# Patient Record
Sex: Male | Born: 1960
Health system: Southern US, Community
[De-identification: ages and names within clinical notes are randomized; demographics above are authoritative.]

## PROBLEM LIST (undated history)

## (undated) DIAGNOSIS — K219 Gastro-esophageal reflux disease without esophagitis: Secondary | ICD-10-CM

## (undated) DIAGNOSIS — C61 Malignant neoplasm of prostate: Secondary | ICD-10-CM

## (undated) DIAGNOSIS — R9431 Abnormal electrocardiogram [ECG] [EKG]: Secondary | ICD-10-CM

## (undated) DIAGNOSIS — F419 Anxiety disorder, unspecified: Secondary | ICD-10-CM

## (undated) DIAGNOSIS — F17210 Nicotine dependence, cigarettes, uncomplicated: Secondary | ICD-10-CM

## (undated) DIAGNOSIS — R202 Paresthesia of skin: Secondary | ICD-10-CM

## (undated) DIAGNOSIS — N529 Male erectile dysfunction, unspecified: Secondary | ICD-10-CM

## (undated) HISTORY — DX: Anxiety disorder, unspecified: F41.9

## (undated) HISTORY — PX: PROSTATE BIOPSY: SHX241

## (undated) HISTORY — DX: Abnormal electrocardiogram (ECG) (EKG): R94.31

## (undated) HISTORY — PX: NO PAST SURGERIES: SHX2092

## (undated) HISTORY — DX: Paresthesia of skin: R20.2

## (undated) HISTORY — DX: Gastro-esophageal reflux disease without esophagitis: K21.9

## (undated) HISTORY — DX: Male erectile dysfunction, unspecified: N52.9

## (undated) HISTORY — DX: Nicotine dependence, cigarettes, uncomplicated: F17.210

---

## 1999-09-25 ENCOUNTER — Emergency Department (HOSPITAL_COMMUNITY): Admission: EM | Admit: 1999-09-25 | Discharge: 1999-09-25 | Payer: Self-pay | Admitting: Emergency Medicine

## 2004-08-01 ENCOUNTER — Ambulatory Visit: Payer: Self-pay | Admitting: Pulmonary Disease

## 2005-09-13 ENCOUNTER — Ambulatory Visit: Payer: Self-pay | Admitting: Pulmonary Disease

## 2006-08-01 ENCOUNTER — Ambulatory Visit: Payer: Self-pay | Admitting: Pulmonary Disease

## 2006-08-29 ENCOUNTER — Ambulatory Visit: Payer: Self-pay | Admitting: Pulmonary Disease

## 2006-08-29 LAB — CONVERTED CEMR LAB
Basophils Relative: 0 % (ref 0.0–1.0)
Bilirubin Urine: NEGATIVE
CO2: 30 meq/L (ref 19–32)
Calcium: 9.2 mg/dL (ref 8.4–10.5)
Chloride: 107 meq/L (ref 96–112)
Chol/HDL Ratio, serum: 3.5
Epithelial cells, urine: NEGATIVE /lpf
GFR calc non Af Amer: 78 mL/min
Glomerular Filtration Rate, Af Am: 94 mL/min/{1.73_m2}
Glucose, Bld: 99 mg/dL (ref 70–99)
Hemoglobin: 15.8 g/dL (ref 13.0–17.0)
Ketones, ur: NEGATIVE mg/dL
Lymphocytes Relative: 49 % — ABNORMAL HIGH (ref 12.0–46.0)
MCHC: 34 g/dL (ref 30.0–36.0)
MCV: 93.5 fL (ref 78.0–100.0)
Neutro Abs: 1.5 10*3/uL (ref 1.4–7.7)
Potassium: 3.9 meq/L (ref 3.5–5.1)
RBC / HPF: NONE SEEN
RBC: 4.97 M/uL (ref 4.22–5.81)
TSH: 1.37 microintl units/mL (ref 0.35–5.50)
VLDL: 8 mg/dL (ref 0–40)
WBC: 3.8 10*3/uL — ABNORMAL LOW (ref 4.5–10.5)

## 2007-09-03 DIAGNOSIS — F411 Generalized anxiety disorder: Secondary | ICD-10-CM | POA: Insufficient documentation

## 2007-09-04 ENCOUNTER — Ambulatory Visit: Payer: Self-pay | Admitting: Pulmonary Disease

## 2007-09-04 DIAGNOSIS — R209 Unspecified disturbances of skin sensation: Secondary | ICD-10-CM | POA: Insufficient documentation

## 2007-09-04 DIAGNOSIS — F17200 Nicotine dependence, unspecified, uncomplicated: Secondary | ICD-10-CM | POA: Insufficient documentation

## 2007-09-04 LAB — CONVERTED CEMR LAB
BUN: 13 mg/dL (ref 6–23)
Basophils Relative: 0.5 % (ref 0.0–1.0)
Bilirubin Urine: NEGATIVE
Bilirubin, Direct: 0.2 mg/dL (ref 0.0–0.3)
CO2: 30 meq/L (ref 19–32)
Calcium: 9.8 mg/dL (ref 8.4–10.5)
Chloride: 103 meq/L (ref 96–112)
Cholesterol: 169 mg/dL (ref 0–200)
Creatinine, Ser: 1 mg/dL (ref 0.4–1.5)
Crystals: NEGATIVE
GFR calc Af Amer: 104 mL/min
GFR calc non Af Amer: 86 mL/min
Glucose, Bld: 100 mg/dL — ABNORMAL HIGH (ref 70–99)
HCT: 43.5 % (ref 39.0–52.0)
Ketones, ur: NEGATIVE mg/dL
LDL Cholesterol: 118 mg/dL — ABNORMAL HIGH (ref 0–99)
Lymphocytes Relative: 35.1 % (ref 12.0–46.0)
MCHC: 35.9 g/dL (ref 30.0–36.0)
Monocytes Relative: 16.9 % — ABNORMAL HIGH (ref 3.0–11.0)
Neutrophils Relative %: 46.3 % (ref 43.0–77.0)
Platelets: 266 10*3/uL (ref 150–400)
RBC / HPF: NONE SEEN
RBC: 4.68 M/uL (ref 4.22–5.81)
Specific Gravity, Urine: 1.015 (ref 1.000–1.03)
TSH: 0.9 microintl units/mL (ref 0.35–5.50)
Total Protein, Urine: NEGATIVE mg/dL

## 2008-09-15 ENCOUNTER — Ambulatory Visit: Payer: Self-pay | Admitting: Pulmonary Disease

## 2008-09-15 DIAGNOSIS — F528 Other sexual dysfunction not due to a substance or known physiological condition: Secondary | ICD-10-CM | POA: Insufficient documentation

## 2009-07-03 ENCOUNTER — Encounter: Payer: Self-pay | Admitting: Adult Health

## 2009-07-03 ENCOUNTER — Ambulatory Visit: Payer: Self-pay | Admitting: Pulmonary Disease

## 2009-07-04 LAB — CONVERTED CEMR LAB
Hemoglobin, Urine: NEGATIVE
Leukocytes, UA: NEGATIVE
Nitrite: NEGATIVE
PSA: 0.82 ng/mL (ref 0.10–4.00)

## 2009-09-22 ENCOUNTER — Ambulatory Visit: Payer: Self-pay | Admitting: Pulmonary Disease

## 2009-09-22 DIAGNOSIS — R739 Hyperglycemia, unspecified: Secondary | ICD-10-CM | POA: Insufficient documentation

## 2009-09-22 DIAGNOSIS — R9431 Abnormal electrocardiogram [ECG] [EKG]: Secondary | ICD-10-CM | POA: Insufficient documentation

## 2009-09-22 DIAGNOSIS — R7309 Other abnormal glucose: Secondary | ICD-10-CM | POA: Insufficient documentation

## 2009-09-22 LAB — CONVERTED CEMR LAB
Alkaline Phosphatase: 46 units/L (ref 39–117)
BUN: 15 mg/dL (ref 6–23)
Cholesterol: 155 mg/dL (ref 0–200)
Creatinine, Ser: 1 mg/dL (ref 0.4–1.5)
Eosinophils Relative: 1.9 % (ref 0.0–5.0)
GFR calc non Af Amer: 103.4 mL/min (ref >60)
Glucose, Bld: 106 mg/dL — ABNORMAL HIGH (ref 70–99)
Hemoglobin, Urine: NEGATIVE
Hemoglobin: 14.4 g/dL (ref 13.0–17.0)
Hgb A1c MFr Bld: 5.8 % (ref 4.6–6.5)
LDL Cholesterol: 105 mg/dL — ABNORMAL HIGH (ref 0–99)
Leukocytes, UA: NEGATIVE
Lymphocytes Relative: 34.6 % (ref 12.0–46.0)
MCHC: 33.9 g/dL (ref 30.0–36.0)
Monocytes Relative: 17.2 % — ABNORMAL HIGH (ref 3.0–12.0)
Neutro Abs: 1.8 10*3/uL (ref 1.4–7.7)
Neutrophils Relative %: 46.3 % (ref 43.0–77.0)
Nitrite: NEGATIVE
Platelets: 246 10*3/uL (ref 150.0–400.0)
RBC: 4.49 M/uL (ref 4.22–5.81)
Sodium: 143 meq/L (ref 135–145)
Specific Gravity, Urine: >=1.03 (ref 1.000–1.030)
TSH: 1.21 microintl units/mL (ref 0.35–5.50)
Total Bilirubin: 0.9 mg/dL (ref 0.3–1.2)
Total CHOL/HDL Ratio: 3
Urine Glucose: NEGATIVE mg/dL
pH: 5.5 (ref 5.0–8.0)

## 2010-05-22 ENCOUNTER — Telehealth (INDEPENDENT_AMBULATORY_CARE_PROVIDER_SITE_OTHER): Payer: Self-pay | Admitting: *Deleted

## 2010-09-16 LAB — CONVERTED CEMR LAB
ALT: 23 units/L (ref 0–53)
AST: 21 units/L (ref 0–37)
Alkaline Phosphatase: 49 units/L (ref 39–117)
Basophils Relative: 0.8 % (ref 0.0–3.0)
Chloride: 104 meq/L (ref 96–112)
Cholesterol: 147 mg/dL (ref 0–200)
Crystals: NEGATIVE
GFR calc Af Amer: 117 mL/min
GFR calc non Af Amer: 97 mL/min
HDL: 37.2 mg/dL — ABNORMAL LOW (ref >39.0)
Ketones, ur: NEGATIVE mg/dL
LDL Cholesterol: 104 mg/dL — ABNORMAL HIGH (ref 0–99)
Leukocytes, UA: NEGATIVE
Lymphocytes Relative: 33.6 % (ref 12.0–46.0)
MCHC: 33.9 g/dL (ref 30.0–36.0)
MCV: 94.3 fL (ref 78.0–100.0)
Mucus, UA: NEGATIVE
Neutro Abs: 1.8 10*3/uL (ref 1.4–7.7)
Neutrophils Relative %: 46.9 % (ref 43.0–77.0)
Potassium: 3.9 meq/L (ref 3.5–5.1)
RBC: 4.51 M/uL (ref 4.22–5.81)
RDW: 12.4 % (ref 11.5–14.6)
Sodium: 139 meq/L (ref 135–145)
Specific Gravity, Urine: 1.025 (ref 1.000–1.03)
TSH: 1.26 microintl units/mL (ref 0.35–5.50)
Total Bilirubin: 1.3 mg/dL — ABNORMAL HIGH (ref 0.3–1.2)
Total Protein, Urine: NEGATIVE mg/dL
Urine Glucose: NEGATIVE mg/dL
Urobilinogen, UA: 0.2 (ref 0.0–1.0)

## 2010-09-18 NOTE — Progress Notes (Signed)
Summary: talk to nurse----metal in buttocks  Phone Note Call from Patient Call back at Home Phone [phone]   Caller: Patient Call For: nadel Reason for Call: Talk to Nurse Summary of Call: Pt states he sat on his lawn mower Friday, and he suspects he got a piece of metal stuck in his backside, c/o of swelling around this area since Sunday, pls advise.//[facility] Initial call taken by: Darletta Moll,  May 22, 2010 10:01 AM  Follow-up for Phone Call        called and spoke with pt.  pt states he believes he either has a piece of metal or fiberglass in his buttocks and the area is sore to the touch. Pt wonders if it might be getting infected.  I recommended pt go to urgent care to be evaluated as the area might need to be lanced.  Pt agreed and stated he will go to urgent care.  Aundra Millet Reynolds LPN  May 22, 2010 10:31 AM

## 2010-09-18 NOTE — Assessment & Plan Note (Signed)
Summary: physical/klw   CC:  1 yr ROV & CPX....  History of Present Illness: 50 y/o BM here for a yearly follow up visit and physical exam...    ~  Jan09:  he has been a smoker, and regular beer drinker; but states that he quit smoking for good about 5 days ago, and that he only drinks on weekends now... his only complaint revolves around some paresthesias that he feels when he raises his arms up in the air... he has noted some vague paresthesias in the past- eg a cold sensation in his L arm in 7/05 which improved after warming his arm w/ a blanket... 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 when he has to raise his arms out or up he gets a numbness in them that is uncomfortable to him... it generally goes away when he lowers his arms... PE was normal and pulses normal without briuts or loss of pulses w/ movement of the arm (full ROM too)... offered neuro eval but he prefers to wait...   ~  Jan10:  he has had a good year- no new complaints or concerns... he quit smoking last yr and has remained quit!  He has gained 17# in the interim to 173# today... his paresthesias are less noticable/ improved overall...     ~  September 22, 2009:  he has had a good yr- no new complaints or concerns... he has remained off cigs, weight stable at 173#... sl anxious- worries about male pattern baldness & some gray hairs...    Current Problems:   Ex-SMOKER (ICD-305.1) - he quit Jan09 on his own... doing well so far...  Hx of ELECTROCARDIOGRAM, ABNORMAL (ICD-794.31) - baseline EKG w/ benign early repolarization... he denies CP, palpit, dizzy, syncope, edema, etc...  OTHER ABNORMAL GLUCOSE (ICD-790.29) - hx of FBS as high as 117 in the past... FBS range 92-117 over the last 10 yrs... diet Rx alone- avoid sweets...  ERECTILE DYSFUNCTION (ICD-302.72) - on VIAGRA as needed.  PARESTHESIA (ICD-782.0) - ** SEE ABOVE ** less noticable now- improved.  ANXIETY (ICD-300.00) - prev hx  counselling from [facility]... he does not want anxiolytic Rx...  HEALTH MAINTENANCE:  49 y/o gentleman...  ~  DRE is neg- prostate 2+, no nodules... stool heme neg...    Allergies (verified): No Known Drug Allergies  Comments:  Nurse/Medical Assistant: The patient's medications and allergies were reviewed with the patient and were updated in the Medication and Allergy Lists.  Past History:  Past Medical History:  Hx of SMOKER (ICD-305.1) Hx of ELECTROCARDIOGRAM, ABNORMAL (ICD-794.31) OTHER ABNORMAL GLUCOSE (ICD-790.29) ERECTILE DYSFUNCTION (ICD-302.72) PARESTHESIA (ICD-782.0) ANXIETY (ICD-300.00)  Family History: Reviewed history from 09/15/2008 and no changes required. Father alive age 7, on disability x yrs after MVA Mother died age 68 in MVA 64 Sibs- 1Bro died in MVA, 1Bro died w/ leukemia... 2 Uncles w/ prostate cancer (and Grandfather also)...  Social History: Reviewed history from 09/15/2008 and no changes required. Divorced 1 Child Works [employer] Ex-Smoker, quit 1/09... Beer drinker  Review of Systems      See HPI       The patient complains of anxiety.  The patient denies fever, chills, sweats, anorexia, fatigue, weakness, malaise, weight loss, sleep disorder, blurring, diplopia, eye irritation, eye discharge, vision loss, eye pain, photophobia, earache, ear discharge, tinnitus, decreased hearing, nasal congestion, nosebleeds, sore throat, hoarseness, chest pain, palpitations, syncope, dyspnea on exertion, orthopnea, PND, peripheral edema, cough, dyspnea  at rest, excessive sputum, hemoptysis, wheezing, pleurisy, nausea, vomiting, diarrhea, constipation, change in bowel habits, abdominal pain, melena, hematochezia, jaundice, gas/bloating, indigestion/heartburn, dysphagia, odynophagia, dysuria, hematuria, urinary frequency, urinary hesitancy, nocturia, incontinence, back pain, joint pain, joint swelling, muscle cramps, muscle weakness,  stiffness, arthritis, sciatica, restless legs, leg pain at night, leg pain with exertion, rash, itching, dryness, suspicious lesions, paralysis, paresthesias, seizures, tremors, vertigo, transient blindness, frequent falls, frequent headaches, difficulty walking, depression, memory loss, confusion, cold intolerance, heat intolerance, polydipsia, polyphagia, polyuria, unusual weight change, abnormal bruising, bleeding, enlarged lymph nodes, urticaria, allergic rash, hay fever, and recurrent infections.    Vital Signs:  Patient profile:   50 year old male Height:      67 inches Weight:      173 pounds O2 Sat:      98 % on Room air Temp:     98.1 degrees F oral Pulse rate:   56 / minute BP sitting:   110 / 60  (right arm) Cuff size:   regular  Vitals Entered By: Randell Loop CMA (September 22, 2009 10:48 AM)  O2 Sat at Rest %:  98 O2 Flow:  Room air CC: 1 yr ROV & CPX... Is Patient Diabetic? No Pain Assessment Patient in pain? no      Comments no changes in meds   Physical Exam  Additional Exam:  WD, WN, 50 y/o BM in NAD... GENERAL:  Alert & oriented; pleasant & cooperative. HEENT:  [place]/AT, EOM-wnl, PERRLA, Fundi-benign, EACs-clear, TMs-wnl, NOSE-clear, THROAT-clear & wnl. NECK:  Supple w/ full ROM; no JVD; normal carotid impulses w/o bruits; no thyromegaly or nodules palpated; no lymphadenopathy. CHEST:  Clear to P & A; without wheezes/ rales/ or rhonchi. HEART:  Regular Rhythm; without murmurs/ rubs/ or gallops. ABDOMEN:  Soft & nontender; normal bowel sounds; no organomegaly or masses detected. Testicles well descended , no masses noted, femoral pulses intact , no hernias noted, prostate 2+ normal, stool neg. EXT: without deformities or arthritic changes; no varicose veins/ venous insuffic/ or edema. DERM:  No lesions noted; no rash etc...     CXR  Procedure date:  09/22/2009  Findings:      CHEST - 2 VIEW   Comparison: 09/15/2008   Findings: Heart size is normal.   The mediastinum is unremarkable. The lungs are clear.  No effusions.  No significant bony finding.   IMPRESSION: Normal chest   Read By:  Thomasenia Sales,  M.D.    Comments:      I have personally reviewed this XRay on the Image[employer] system- SN   EKG  Procedure date:  09/22/2009  Findings:      Sinus bradycardia with rate of:  52/ min... STTWA compatible w/ benign early repolarization... No change from old tracings...  SN   MISC. Report  Procedure date:  09/22/2009  Findings:      Lipid Panel (LIPID)   Cholesterol               155 mg/dL                   1-191   Triglycerides             24.0 mg/dL                  4.7-829.5   HDL                       62.13 mg/dL                 >  39.00   LDL Cholesterol      [H]  540 mg/dL                   9-81  BMP (METABOL)   Sodium                    143 mEq/L                   135-145   Potassium                 4.6 mEq/L                   3.5-5.1   Chloride                  110 mEq/L                   96-112   Carbon Dioxide            30 mEq/L                    19-32   Glucose              [H]  106 mg/dL                   19-14   BUN                       15 mg/dL                    7-82   Creatinine                1.0 mg/dL                   9.5-6.2   Calcium                   9.2 mg/dL                   1.3-08.6   GFR                       103.40 mL/min               >60  Hepatic/Liver Function Panel (HEPATIC)   Total Bilirubin           0.9 mg/dL                   5.7-8.4   Direct Bilirubin          0.1 mg/dL                   6.9-6.2   Alkaline Phosphatase      46 U/L                      39-117   AST                       16 U/L                      0-37   ALT                       18 U/L  0-53   Total Protein             7.2 g/dL                    4.5-4.0   Albumin                   4.0 g/dL                    9.8-1.1  Comments:      CBC Platelet w/Diff (CBCD)   White Cell Count     [L]  3.9  K/uL                    4.5-10.5   Red Cell Count            4.49 Mil/uL                 4.22-5.81   Hemoglobin                14.4 g/dL                   91.4-78.2   Hematocrit                42.6 %                      39.0-52.0   MCV                       95.0 fl                     78.0-100.0   Platelet Count            246.0 K/uL                  150.0-400.0   Neutrophil %              46.3 %                      43.0-77.0   Lymphocyte %              34.6 %                      12.0-46.0   Monocyte %           [H]  17.2 %                      3.0-12.0   Eosinophils%              1.9 %                       0.0-5.0   Basophils %               0.0 %                       0.0-3.0  TSH (TSH)   FastTSH                   1.21 uIU/mL                 0.35-5.50  Hemoglobin A1C (A1C)   Hemoglobin A1C            5.8 %  4.6-6.5  UDip w/Micro (URINE)   Color                     YELLOW   Clarity                   CLEAR                       Clear   Specific Gravity          >=1.030                     1.000 - 1.030   Urine Ph                  5.5                         5.0-8.0   Protein                   NEGATIVE                    Negative   Urine Glucose             NEGATIVE                    Negative   Ketones                   NEGATIVE                    Negative   Urine Bilirubin           NEGATIVE                    Negative   Blood                     NEGATIVE                    Negative   Urobilinogen              0.2                         0.0 - 1.0   Leukocyte Esterace        NEGATIVE                    Negative   Nitrite                   NEGATIVE                    Negative   Urine WBC                 0-2/hpf                     0-2/hpf   Urine Epith               Rare(0-4/hpf)               Rare(0-4/hpf)   Impression & Recommendations:  Problem # 1:  PHYSICAL EXAMINATION (ICD-V70.0) Good general health... he has remained off cigs, & cutting down on  his beer...  Orders: EKG w/ Interpretation (93000) Prescription Created Electronically [phone]) T-2 View CXR (71020TC) TLB-Lipid Panel (80061-LIPID) TLB-BMP (Basic Metabolic  Panel-BMET) (80048-METABOL) TLB-Hepatic/Liver Function Pnl (80076-HEPATIC) TLB-CBC Platelet - w/Differential (85025-CBCD) TLB-TSH (Thyroid Stimulating Hormone) (84443-TSH) TLB-A1C / Hgb A1C (Glycohemoglobin) (83036-A1C) TLB-Udip w/ Micro (81001-URINE)  Problem # 2:  ERECTILE DYSFUNCTION (ICD-302.72) We refilled his med... His updated medication list for this problem includes:    Viagra 100 Mg Tabs (Sildenafil citrate) ..... Use as directed  Problem # 3:  ANXIETY (ICD-300.00) Offered anxiolytic Rx but he declines...  Problem # 4:  OTHER MEDICAL PROBLEMS AS NOTED>>>  Complete Medication List: 1)  Aspirin 325 Mg Tabs (Aspirin) .... Take 1 tab by mouth once daily.Marland KitchenMarland Kitchen 2)  Viagra 100 Mg Tabs (Sildenafil citrate) .... Use as directed  Patient Instructions: 1)  Today we updated your med list- see below.... 2)  We refilled your med per request... 3)  Today we did your follow up CXR, EKG, & fasting blood work... please call the "phone tree" in a few days for your lab results.Marland KitchenMarland Kitchen 4)  Call for any problems.Marland KitchenMarland Kitchen 5)  Please schedule a follow-up appointment in 1 year, sooner as needed. Prescriptions: VIAGRA 100 MG  TABS (SILDENAFIL CITRATE) use as directed  #30 x prn   Entered and Authorized by:   Michele Mcalpine MD   Signed by:   Michele Mcalpine MD on 09/22/2009   Method used:   Print then Give to Patient   RxID:   [id number]    CardioPerfect ECG  ID: [id number] Patient: Danny [name] DOB: 06/19/1961 Age: 50 Years Old Sex: Male Race: Black Physician: Kriste Basque Technician: Renold Genta Height: 67 Weight: 173 Status: Unconfirmed Past Medical History:  Ex-SMOKER (ICD-305.1) - he quit Jan09 on his own... doing well so far...  ERECTILE DYSFUNCTION (ICD-302.72) - on VIAGRA as needed.  PARESTHESIA (ICD-782.0) -     ANXIETY (ICD-300.00) - [facility]...    Recorded: 09/22/2009 11:00 AM P/PR: 120 ms / 177 ms - Heart rate (maximum exercise) QRS: 93 QT/QTc/QTd: 400 ms / 386 ms / 43 ms - Heart rate (maximum exercise)  P/QRS/T axis: 69 deg / 69 deg / 50 deg - Heart rate (maximum exercise)  Heartrate: 52 bpm  Interpretation:  Sinus bradycardia with rate of:  52/ min... STTWA compatible w/ benign early repolarization... No change from old tracings...  SN

## 2010-12-07 ENCOUNTER — Telehealth: Payer: Self-pay | Admitting: Pulmonary Disease

## 2010-12-07 MED ORDER — CEPHALEXIN 500 MG PO CAPS: ORAL_CAPSULE | ORAL | Status: DC

## 2010-12-07 MED ORDER — NEOMYCIN-POLYMYXIN-HC 1 % OT SOLN: OTIC | Status: DC

## 2010-12-07 NOTE — Telephone Encounter (Signed)
Spoke w/ pt and he c/o severe R ear pain and can barely hear out of it, slight cough this morning, sore throat, feels congested. All started yesterday. Pt wanted to be seen but have no openings. Pt last seen 09/22/09 and told to f/u in 1 year and has no pending apt Pt denies any fever. Pt states he is going out of town this weekend to take care of his father and would like something called in if he can't be seen. Please advise Dr. Kriste Basque. Thanks  KNDA  Carver Fila, CMA

## 2010-12-07 NOTE — Telephone Encounter (Signed)
Per SN--please call in keflex 500  #21  1 tablet po tid and cortisporin otic 1 vial  2-3 drops in affected ear tid. thanks

## 2010-12-07 NOTE — Telephone Encounter (Signed)
Spoke w/ pt and advised of SN recs. Pt verbalized understanding. Pt aware rx was sent to [facility]

## 2011-02-08 ENCOUNTER — Encounter: Payer: Self-pay | Admitting: Pulmonary Disease

## 2011-02-08 ENCOUNTER — Ambulatory Visit (INDEPENDENT_AMBULATORY_CARE_PROVIDER_SITE_OTHER)
Admission: RE | Admit: 2011-02-08 | Discharge: 2011-02-08 | Disposition: A | Discharge status: Home / Self Care | Payer: 59 | Source: Ambulatory Visit | Attending: Pulmonary Disease | Admitting: Pulmonary Disease

## 2011-02-08 ENCOUNTER — Other Ambulatory Visit (INDEPENDENT_AMBULATORY_CARE_PROVIDER_SITE_OTHER): Payer: 59

## 2011-02-08 ENCOUNTER — Ambulatory Visit (INDEPENDENT_AMBULATORY_CARE_PROVIDER_SITE_OTHER): Payer: 59 | Admitting: Pulmonary Disease

## 2011-02-08 VITALS — BP 112/62 | HR 66 | Temp 97.5°F | Ht 67.0 in | Wt 169.6 lb

## 2011-02-08 DIAGNOSIS — Z0000 Encounter for general adult medical examination without abnormal findings: Secondary | ICD-10-CM

## 2011-02-08 DIAGNOSIS — R5381 Other malaise: Secondary | ICD-10-CM

## 2011-02-08 DIAGNOSIS — F411 Generalized anxiety disorder: Secondary | ICD-10-CM

## 2011-02-08 DIAGNOSIS — R7309 Other abnormal glucose: Secondary | ICD-10-CM

## 2011-02-08 DIAGNOSIS — F528 Other sexual dysfunction not due to a substance or known physiological condition: Secondary | ICD-10-CM

## 2011-02-08 DIAGNOSIS — R5383 Other fatigue: Secondary | ICD-10-CM

## 2011-02-08 DIAGNOSIS — R209 Unspecified disturbances of skin sensation: Secondary | ICD-10-CM

## 2011-02-08 DIAGNOSIS — R9431 Abnormal electrocardiogram [ECG] [EKG]: Secondary | ICD-10-CM

## 2011-02-08 LAB — TESTOSTERONE: Testosterone: 435.34 ng/dL (ref 350.00–890.00)

## 2011-02-08 LAB — CBC WITH DIFFERENTIAL/PLATELET
Basophils Absolute: 0 10*3/uL (ref 0.0–0.1)
Basophils Relative: 0.4 % (ref 0.0–3.0)
Eosinophils Absolute: 0.1 10*3/uL (ref 0.0–0.7)
MCHC: 34.3 g/dL (ref 30.0–36.0)
MCV: 93.1 fl (ref 78.0–100.0)
Monocytes Absolute: 0.9 10*3/uL (ref 0.1–1.0)
Neutrophils Relative %: 48.2 % (ref 43.0–77.0)
RBC: 4.54 Mil/uL (ref 4.22–5.81)
RDW: 13.9 % (ref 11.5–14.6)

## 2011-02-08 LAB — BASIC METABOLIC PANEL
Chloride: 102 mEq/L (ref 96–112)
Creatinine, Ser: 0.9 mg/dL (ref 0.4–1.5)
Potassium: 4.4 mEq/L (ref 3.5–5.1)
Sodium: 137 mEq/L (ref 135–145)

## 2011-02-08 LAB — HEPATIC FUNCTION PANEL
ALT: 19 U/L (ref 0–53)
AST: 18 U/L (ref 0–37)
Alkaline Phosphatase: 53 U/L (ref 39–117)
Bilirubin, Direct: 0.2 mg/dL (ref 0.0–0.3)
Total Bilirubin: 1.6 mg/dL — ABNORMAL HIGH (ref 0.3–1.2)

## 2011-02-08 LAB — LIPID PANEL
LDL Cholesterol: 114 mg/dL — ABNORMAL HIGH (ref 0–99)
Total CHOL/HDL Ratio: 4

## 2011-02-08 MED ORDER — CLOTRIMAZOLE-BETAMETHASONE 1-0.05 % EX CREA: TOPICAL_CREAM | CUTANEOUS | Status: DC

## 2011-02-08 NOTE — Patient Instructions (Signed)
Today we updated your med list in EPIC>    We wrote a new prescription for a cream to apply to your rash twice daily as needed...  Today we did your follow up CXR, EKG, & fasting blood work...    Please call the PHONE TREE in a few days for your results...    Dial [id number] & when prompted enter your patient number followed by the # symbol...    Your patient number is:  [id number]  Call for any questions...  Let's plan another routine appt in 1 year; sooner if needed for problems.Marland KitchenMarland Kitchen

## 2011-02-08 NOTE — Progress Notes (Signed)
Subjective:    Patient ID: Danny [name], male    DOB: 02-May-1961, 50 y.o.   MRN: [id number]  HPI 50 y/o BM here for a yearly follow up visit and physical exam...   ~  Jan09:  he has been a smoker, and regular beer drinker; but states that he quit smoking for good about 5 days ago, and that he only drinks on weekends now... his only complaint revolves around some paresthesias that he feels when he raises his arms up in the air... he has noted some vague paresthesias in the past- eg a cold sensation in his L arm in 7/05 which improved after warming his arm w/ a blanket... 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 when he has to raise his arms out or up he gets a numbness in them that is uncomfortable to him... it generally goes away when he lowers his arms... PE was normal and pulses normal without briuts or loss of pulses w/ movement of the arm (full ROM too)... offered neuro eval but he prefers to wait...  ~  Jan10:  he has had a good year- no new complaints or concerns... he quit smoking last yr and has remained quit!  He has gained 17# in the interim to 173# today... his paresthesias are less noticable/ improved overall...    ~  September 22, 2009:  he has had a good yr- no new complaints or concerns... he has remained off cigs, weight stable at 173#... sl anxious- worries about male pattern baldness & some gray hairs...  ~  February 08, 2011:  72mo ROV & CPX> he states that he's been doing "so-so", father died w/ lung cancer (smoker), medically he's reports doing OK- some minor complaints: ear infection Rx w/ Keflex, constipation, ED & Viagra helps, tired all the time, rash in groin, not resting well & not exercising...    He has remained off cigs now for ~3.5 yrs, doing well, no cough or phlegm...    CXR, EKG, & Fasting Labs all look good today...   Problem List:  Ex-SMOKER (ICD-305.1) - he quit Jan09 on his own & he has remained off cigarettes, doing well w/o  cough, sputum, dyspnea... ~  CXR 6/12 remains clear, NAD...  Hx of ELECTROCARDIOGRAM, ABNORMAL (ICD-794.31) - baseline EKG w/ benign early repolarization... he denies CP, palpit, dizzy, syncope, edema, etc... ~  EKG 6/12 showed NSR, early repol changes, NAD...  OTHER ABNORMAL GLUCOSE (ICD-790.29) - hx of FBS as high as 117 in the past... FBS range 92-117 over the last 10 yrs... diet Rx alone- avoid sweets... ~  Labs 6/12 showed FBS= 83  ERECTILE DYSFUNCTION (ICD-302.72) - on VIAGRA as needed. ~  Labs 6/12 showed Testosterone level = 435  PARESTHESIA (ICD-782.0) - ** SEE ABOVE ** less noticable now- improved.  ANXIETY (ICD-300.00) - prev hx counselling from [facility]... he does not want anxiolytic Rx...  HEALTH MAINTENANCE:  50 y/o gentleman... ~  DRE is neg- prostate 2+, no nodules... stool heme neg...   No past surgical history on file.   Outpatient Encounter Prescriptions as of 02/08/2011  Medication Sig Dispense Refill  . sildenafil (VIAGRA) 100 MG tablet Use as directed       . aspirin 325 MG tablet Take 325 mg by mouth daily.        . cephALEXin (KEFLEX) 500 MG capsule 1 capsule three times a day  21 capsule  0  . NEOMYCIN-POLYMYXIN-HC, OTIC, (CORTISPORIN) 1 %  SOLN 2-3 drops in affected ear three times a day  1 Bottle  0    No Known Allergies   Current Medications, Allergies, Past Medical History, Past Surgical History, Family History, and Social History were reviewed in [employer] record.   Review of Systems        The patient complains of anxiety.  The patient denies fever, chills, sweats, anorexia, fatigue, weakness, malaise, weight loss, sleep disorder, blurring, diplopia, eye irritation, eye discharge, vision loss, eye pain, photophobia, earache, ear discharge, tinnitus, decreased hearing, nasal congestion, nosebleeds, sore throat, hoarseness, chest pain, palpitations, syncope, dyspnea on exertion, orthopnea, PND, peripheral edema, cough,  dyspnea at rest, excessive sputum, hemoptysis, wheezing, pleurisy, nausea, vomiting, diarrhea, constipation, change in bowel habits, abdominal pain, melena, hematochezia, jaundice, gas/bloating, indigestion/heartburn, dysphagia, odynophagia, dysuria, hematuria, urinary frequency, urinary hesitancy, nocturia, incontinence, back pain, joint pain, joint swelling, muscle cramps, muscle weakness, stiffness, arthritis, sciatica, restless legs, leg pain at night, leg pain with exertion, rash, itching, dryness, suspicious lesions, paralysis, paresthesias, seizures, tremors, vertigo, transient blindness, frequent falls, frequent headaches, difficulty walking, depression, memory loss, confusion, cold intolerance, heat intolerance, polydipsia, polyphagia, polyuria, unusual weight change, abnormal bruising, bleeding, enlarged lymph nodes, urticaria, allergic rash, hay fever, and recurrent infections.    Objective:   Physical Exam      WD, WN, 50 y/o BM in NAD... GENERAL:  Alert & oriented; pleasant & cooperative. HEENT:  [place]/AT, EOM-wnl, PERRLA, Fundi-benign, EACs-clear, TMs-wnl, NOSE-clear, THROAT-clear & wnl. NECK:  Supple w/ full ROM; no JVD; normal carotid impulses w/o bruits; no thyromegaly or nodules palpated; no lymphadenopathy. CHEST:  Clear to P & A; without wheezes/ rales/ or rhonchi. HEART:  Regular Rhythm; without murmurs/ rubs/ or gallops. ABDOMEN:  Soft & nontender; normal bowel sounds; no organomegaly or masses detected. Testicles well descended , no masses noted, femoral pulses intact , no hernias noted, prostate 2+ normal, stool neg. EXT: without deformities or arthritic changes; no varicose veins/ venous insuffic/ or edema. DERM:  No lesions noted; no rash etc...   Assessment & Plan:   CPX>  He is stable w/ CXR, EKG, Fasting labs all outlined above & OK...  Ex-smoker> he has remained quit & essentially asymptomatic...  See Prob List above.Marland KitchenMarland Kitchen

## 2011-02-16 ENCOUNTER — Encounter: Payer: Self-pay | Admitting: Pulmonary Disease

## 2011-06-26 ENCOUNTER — Encounter: Payer: Self-pay | Admitting: Pulmonary Disease

## 2011-06-26 ENCOUNTER — Ambulatory Visit (INDEPENDENT_AMBULATORY_CARE_PROVIDER_SITE_OTHER): Payer: 59 | Admitting: Pulmonary Disease

## 2011-06-26 VITALS — BP 118/78 | HR 74 | Temp 97.2°F | Ht 67.0 in | Wt 180.8 lb

## 2011-06-26 DIAGNOSIS — F411 Generalized anxiety disorder: Secondary | ICD-10-CM

## 2011-06-26 DIAGNOSIS — R200 Anesthesia of skin: Secondary | ICD-10-CM

## 2011-06-26 DIAGNOSIS — M4712 Other spondylosis with myelopathy, cervical region: Secondary | ICD-10-CM

## 2011-06-26 DIAGNOSIS — R209 Unspecified disturbances of skin sensation: Secondary | ICD-10-CM

## 2011-06-26 DIAGNOSIS — M4722 Other spondylosis with radiculopathy, cervical region: Secondary | ICD-10-CM

## 2011-06-26 MED ORDER — CLONAZEPAM 0.5 MG PO TABS: ORAL_TABLET | ORAL | Status: DC

## 2011-06-26 NOTE — Patient Instructions (Signed)
Today we updated your med list in our EPIC system...    Continue your current medications the same...  We decided to further evaluate your right arm numbness by checking an MRI scan of your brain & neck...    We will call you w/ the result when avail...  In the meanwhile start the new KLONOPIN 0.5mg - 1/2 to 1 tab twice daily & note how it affects your symptom...  Call for any questions.Marland KitchenMarland Kitchen

## 2011-06-26 NOTE — Progress Notes (Signed)
Subjective:    Patient ID: Danny [name], male    DOB: 26-Aug-1960, 50 y.o.   MRN: [id number]  HPI 50 y/o BM here for a yearly follow up visit>>  ~  Jan09:  he has been a smoker, and regular beer drinker; but states that he quit smoking for good about 5 days ago, and that he only drinks on weekends now... his only complaint revolves around some paresthesias that he feels when he raises his arms up in the air... he has noted some vague paresthesias in the past- eg a cold sensation in his L arm in 7/05 which improved after warming his arm w/ a blanket... 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 when he has to raise his arms out or up he gets a numbness in them that is uncomfortable to him... it generally goes away when he lowers his arms... PE was normal and pulses normal without briuts or loss of pulses w/ movement of the arm (full ROM too)... offered neuro eval but he prefers to wait...  ~  Jan10:  he has had a good year- no new complaints or concerns... he quit smoking last yr and has remained quit!  He has gained 17# in the interim to 173# today... his paresthesias are less noticable/ improved overall...    ~  September 22, 2009:  he has had a good yr- no new complaints or concerns... he has remained off cigs, weight stable at 173#... sl anxious- worries about male pattern baldness & some gray hairs...  ~  February 08, 2011:  35mo ROV & CPX> he states that he's been doing "so-so", father died w/ lung cancer (smoker), medically he's reports doing OK- some minor complaints: ear infection Rx w/ Keflex, constipation, ED & Viagra helps, tired all the time, rash in groin, not resting well & not exercising...    He has remained off cigs now for ~3.5 yrs, doing well, no cough or phlegm...    CXR, EKG, & Fasting Labs all look good today...  ~  June 26, 2011:  Add-on appt for right arm numbness & tingling to his fingers of 2 wks duration; he denies neck pain or arm weakness &  there has been no trauma etc, but symptoms has been persistent over the last 2 wks; denies CP, palpit, N/V etc; also c/o some memory loss & under some stress; he notes that he tried a "male enhancer" for 1 wk starting about 2wks prior to these symptoms; we discussed need for MRI Brain & CSpine for further evaluation, in the meanwhile we will treat w/ Klonopin 0.5mg  Bid...    NOTE:  MRI Brain==> normal;  MRI CSpine==> multilevel spondylosis & central disc herniation, plus neural foraminal stenosis bilat compressing C7 nerve roots...  REFER to Neurosurg ...   Problem List:  Ex-SMOKER (ICD-305.1) - he quit Jan09 on his own & he has remained off cigarettes, doing well w/o cough, sputum, dyspnea... ~  CXR 6/12 remains clear, NAD...  Hx of ELECTROCARDIOGRAM, ABNORMAL (ICD-794.31) - baseline EKG w/ benign early repolarization... he denies CP, palpit, dizzy, syncope, edema, etc... ~  EKG 6/12 showed NSR, early repol changes, NAD...  OTHER ABNORMAL GLUCOSE (ICD-790.29) - hx of FBS as high as 117 in the past... FBS range 92-117 over the last 10 yrs... diet Rx alone- avoid sweets... ~  Labs 6/12 showed FBS= 83  ERECTILE DYSFUNCTION (ICD-302.72) - on VIAGRA as needed. ~  Labs 6/12 showed Testosterone level =  435  PARESTHESIA (ICD-782.0) - ** SEE ABOVE ** less noticable now- improved. ~  11/12: presents w/ right arm numbness & MRI Brain is WNL, MRI CSpine w/ multilevel spondylosis, central disc herniation, bilat C7 neural foraminal stenosis etc> refer to Neurosurg...  ANXIETY (ICD-300.00) - prev hx counselling from [facility]... he does not want anxiolytic Rx...  HEALTH MAINTENANCE:  50 y/o gentleman... ~  DRE is neg- prostate 2+, no nodules... stool heme neg...   No past surgical history on file.   Outpatient Encounter Prescriptions as of 06/26/2011  Medication Sig Dispense Refill  . aspirin 325 MG tablet Take 325 mg by mouth daily.        . Multiple Vitamins-Minerals (ONE-A-DAY MENS HEALTH  FORMULA) TABS Take 1 tablet by mouth daily.        . sildenafil (VIAGRA) 100 MG tablet Use as directed       . DISCONTD: cephALEXin (KEFLEX) 500 MG capsule 1 capsule three times a day  21 capsule  0  . DISCONTD: clotrimazole-betamethasone (LOTRISONE) cream Apply to affected area 2 times daily  15 g  6  . DISCONTD: NEOMYCIN-POLYMYXIN-HC, OTIC, (CORTISPORIN) 1 % SOLN 2-3 drops in affected ear three times a day  1 Bottle  0    No Known Allergies   Current Medications, Allergies, Past Medical History, Past Surgical History, Family History, and Social History were reviewed in [employer] record.   Review of Systems        The patient complains of anxiety.  The patient denies fever, chills, sweats, anorexia, fatigue, weakness, malaise, weight loss, sleep disorder, blurring, diplopia, eye irritation, eye discharge, vision loss, eye pain, photophobia, earache, ear discharge, tinnitus, decreased hearing, nasal congestion, nosebleeds, sore throat, hoarseness, chest pain, palpitations, syncope, dyspnea on exertion, orthopnea, PND, peripheral edema, cough, dyspnea at rest, excessive sputum, hemoptysis, wheezing, pleurisy, nausea, vomiting, diarrhea, constipation, change in bowel habits, abdominal pain, melena, hematochezia, jaundice, gas/bloating, indigestion/heartburn, dysphagia, odynophagia, dysuria, hematuria, urinary frequency, urinary hesitancy, nocturia, incontinence, back pain, joint pain, joint swelling, muscle cramps, muscle weakness, stiffness, arthritis, sciatica, restless legs, leg pain at night, leg pain with exertion, rash, itching, dryness, suspicious lesions, paralysis, paresthesias, seizures, tremors, vertigo, transient blindness, frequent falls, frequent headaches, difficulty walking, depression, memory loss, confusion, cold intolerance, heat intolerance, polydipsia, polyphagia, polyuria, unusual weight change, abnormal bruising, bleeding, enlarged lymph nodes, urticaria,  allergic rash, hay fever, and recurrent infections.    Objective:   Physical Exam      WD, WN, 50 y/o BM in NAD... GENERAL:  Alert & oriented; pleasant & cooperative. HEENT:  [place]/AT, EOM-wnl, PERRLA, Fundi-benign, EACs-clear, TMs-wnl, NOSE-clear, THROAT-clear & wnl. NECK:  Supple w/ full ROM; no JVD; normal carotid impulses w/o bruits; no thyromegaly or nodules palpated; no lymphadenopathy. CHEST:  Clear to P & A; without wheezes/ rales/ or rhonchi. HEART:  Regular Rhythm; without murmurs/ rubs/ or gallops. ABDOMEN:  Soft & nontender; normal bowel sounds; no organomegaly or masses detected. Testicles well descended , no masses noted, femoral pulses intact , no hernias noted, prostate 2+ normal, stool neg. EXT: without deformities or arthritic changes; no varicose veins/ venous insuffic/ or edema. DERM:  No lesions noted; no rash etc...  RADIOLOGY DATA:  Reviewed in the EPIC EMR & discussed w/ the patient...  LABORATORY DATA:  Reviewed in the EPIC EMR & discussed w/ the patient...    Assessment & Plan:   Right arm numbness & Abn MRI CSpine>  Pt is being referred to Neurosurg for their eval &  rx... We discussed starting Klonopin 0.5mg  Bid...   CPX>  He is stable w/ CXR, EKG, Fasting labs all outlined above & OK...  Ex-smoker> he has remained quit & essentially asymptomatic...  See Prob List above.Marland KitchenMarland Kitchen

## 2011-07-05 ENCOUNTER — Ambulatory Visit (HOSPITAL_COMMUNITY)
Admission: RE | Admit: 2011-07-05 | Discharge: 2011-07-05 | Disposition: A | Discharge status: Home / Self Care | Payer: 59 | Source: Ambulatory Visit | Attending: Pulmonary Disease | Admitting: Pulmonary Disease

## 2011-07-05 DIAGNOSIS — M47812 Spondylosis without myelopathy or radiculopathy, cervical region: Secondary | ICD-10-CM | POA: Insufficient documentation

## 2011-07-05 DIAGNOSIS — R200 Anesthesia of skin: Secondary | ICD-10-CM

## 2011-07-05 DIAGNOSIS — R209 Unspecified disturbances of skin sensation: Secondary | ICD-10-CM | POA: Insufficient documentation

## 2011-07-05 DIAGNOSIS — M5020 Other cervical disc displacement, unspecified cervical region: Secondary | ICD-10-CM | POA: Insufficient documentation

## 2011-07-05 MED ORDER — GADOBENATE DIMEGLUMINE 529 MG/ML IV SOLN
20.0000 mL | Freq: Once | INTRAVENOUS | Status: AC | PRN
  Administered 2011-07-05: 17 mL via INTRAVENOUS

## 2011-07-08 ENCOUNTER — Telehealth: Payer: Self-pay | Admitting: Pulmonary Disease

## 2011-07-08 NOTE — Telephone Encounter (Signed)
ATC NA WCB 

## 2011-07-08 NOTE — Telephone Encounter (Signed)
PT RETURNED CALL . Kathleen W Perdue  °

## 2011-07-08 NOTE — Telephone Encounter (Signed)
Result Note     Pt notified by Leigh... [facility] MRI Brain is neg/ WNL... MRI CSpine w/ multilevel spondylosis, disc disease & neural compromise... REC> Refer to Neurosurg   Patient would like specifics regarding MRI of the spine. He requests to speak with SN personally and says he can be reached daily at [phone] from 9am -2pm.

## 2011-07-09 ENCOUNTER — Other Ambulatory Visit: Payer: Self-pay | Admitting: Pulmonary Disease

## 2011-07-09 DIAGNOSIS — R937 Abnormal findings on diagnostic imaging of other parts of musculoskeletal system: Secondary | ICD-10-CM | POA: Insufficient documentation

## 2011-07-09 NOTE — Telephone Encounter (Signed)
SN called and spoke with pt and he is aware of MRI results.  He is aware that we will get him an appt with neurosurgery for his cspine.

## 2011-07-14 ENCOUNTER — Encounter: Payer: Self-pay | Admitting: Pulmonary Disease

## 2011-07-14 DIAGNOSIS — M4722 Other spondylosis with radiculopathy, cervical region: Secondary | ICD-10-CM | POA: Insufficient documentation

## 2011-11-27 ENCOUNTER — Other Ambulatory Visit: Payer: Self-pay | Admitting: Pulmonary Disease

## 2012-02-10 ENCOUNTER — Other Ambulatory Visit (INDEPENDENT_AMBULATORY_CARE_PROVIDER_SITE_OTHER): Payer: 59

## 2012-02-10 ENCOUNTER — Encounter: Payer: Self-pay | Admitting: Pulmonary Disease

## 2012-02-10 ENCOUNTER — Ambulatory Visit (INDEPENDENT_AMBULATORY_CARE_PROVIDER_SITE_OTHER): Payer: 59 | Admitting: Pulmonary Disease

## 2012-02-10 ENCOUNTER — Ambulatory Visit (INDEPENDENT_AMBULATORY_CARE_PROVIDER_SITE_OTHER)
Admission: RE | Admit: 2012-02-10 | Discharge: 2012-02-10 | Disposition: A | Discharge status: Home / Self Care | Payer: 59 | Source: Ambulatory Visit | Attending: Pulmonary Disease | Admitting: Pulmonary Disease

## 2012-02-10 VITALS — BP 110/68 | HR 79 | Temp 97.5°F | Ht 67.0 in | Wt 176.6 lb

## 2012-02-10 DIAGNOSIS — Z0000 Encounter for general adult medical examination without abnormal findings: Secondary | ICD-10-CM

## 2012-02-10 DIAGNOSIS — G588 Other specified mononeuropathies: Secondary | ICD-10-CM

## 2012-02-10 DIAGNOSIS — G5760 Lesion of plantar nerve, unspecified lower limb: Secondary | ICD-10-CM

## 2012-02-10 LAB — BASIC METABOLIC PANEL
BUN: 15 mg/dL (ref 6–23)
Chloride: 105 mEq/L (ref 96–112)
GFR: 109.93 mL/min (ref >60.00)
Glucose, Bld: 106 mg/dL — ABNORMAL HIGH (ref 70–99)
Potassium: 3.9 mEq/L (ref 3.5–5.1)
Sodium: 139 mEq/L (ref 135–145)

## 2012-02-10 LAB — CBC WITH DIFFERENTIAL/PLATELET
Basophils Absolute: 0 10*3/uL (ref 0.0–0.1)
Eosinophils Absolute: 0.1 10*3/uL (ref 0.0–0.7)
Eosinophils Relative: 2.3 % (ref 0.0–5.0)
HCT: 43.4 % (ref 39.0–52.0)
Lymphs Abs: 1.5 10*3/uL (ref 0.7–4.0)
MCV: 94 fl (ref 78.0–100.0)
Monocytes Absolute: 0.7 10*3/uL (ref 0.1–1.0)
Neutrophils Relative %: 48.5 % (ref 43.0–77.0)
Platelets: 260 10*3/uL (ref 150.0–400.0)
RDW: 12.9 % (ref 11.5–14.6)
WBC: 4.5 10*3/uL (ref 4.5–10.5)

## 2012-02-10 LAB — URINALYSIS
Bilirubin Urine: NEGATIVE
Hgb urine dipstick: NEGATIVE
Ketones, ur: NEGATIVE
Leukocytes, UA: NEGATIVE
Nitrite: NEGATIVE
Urobilinogen, UA: 0.2 (ref 0.0–1.0)
pH: 6 (ref 5.0–8.0)

## 2012-02-10 LAB — TSH: TSH: 1.49 u[IU]/mL (ref 0.35–5.50)

## 2012-02-10 LAB — HEPATIC FUNCTION PANEL
ALT: 23 U/L (ref 0–53)
AST: 25 U/L (ref 0–37)
Alkaline Phosphatase: 53 U/L (ref 39–117)
Total Bilirubin: 0.7 mg/dL (ref 0.3–1.2)

## 2012-02-10 LAB — LIPID PANEL
Cholesterol: 139 mg/dL (ref 0–200)
LDL Cholesterol: 91 mg/dL (ref 0–99)

## 2012-02-10 MED ORDER — METRONIDAZOLE 500 MG PO TABS: 500.0000 mg | ORAL_TABLET | Freq: Three times a day (TID) | ORAL | Status: AC

## 2012-02-10 MED ORDER — SILDENAFIL CITRATE 100 MG PO TABS: ORAL_TABLET | ORAL | Status: DC

## 2012-02-10 NOTE — Patient Instructions (Addendum)
Today we updated your med list in our EPIC system...    Continue your current medications the same...  Today we did your CXR, EKG & FASTING blood work...    We will call you w/ the results...  We will treat a potential Trichomonas infection w/ Flafyl (Metronidazole) 500mg  3 times daily for 7d...  [facility] will [facility] regarding the knot on your right 4th finger...  Call for any questions...  Let's plan a follow up visit in 6 months.Marland KitchenMarland Kitchen

## 2012-02-10 NOTE — Progress Notes (Signed)
Subjective:    Patient ID: Danny [name], male    DOB: [birth year], 51 y.o.   MRN: [id number]  HPI 51 y/o BM here for a yearly follow up visit>>  ~  Jan09:  he has been a smoker, and regular beer drinker; but states that he quit smoking for good about 5 days ago, and that he only drinks on weekends now... his only complaint revolves around some paresthesias that he feels when he raises his arms up in the air... he has noted some vague paresthesias in the past- eg a cold sensation in his L arm in 7/05 which improved after warming his arm w/ a blanket... 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 when he has to raise his arms out or up he gets a numbness in them that is uncomfortable to him... it generally goes away when he lowers his arms... PE was normal and pulses normal without briuts or loss of pulses w/ movement of the arm (full ROM too)... offered neuro eval but he prefers to wait... ~  Jan10:  he has had a good year- no new complaints or concerns... he quit smoking last yr and has remained quit!  He has gained 17# in the interim to 173# today... his paresthesias are less noticable/ improved overall...   ~  [id number]:  he has had a good yr- no new complaints or concerns... he has remained off cigs, weight stable at 173#... sl anxious- worries about male pattern baldness & some gray hairs...  ~  February 08, 2011:  26mo ROV & CPX> he states that he's been doing "so-so", father died w/ lung cancer (smoker), medically he's reports doing OK- some minor complaints: ear infection Rx w/ Keflex, constipation, ED & Viagra helps, tired all the time, rash in groin, not resting well & not exercising...    He has remained off cigs now for ~3.5 yrs, doing well, no cough or phlegm...    CXR, EKG, & Fasting Labs all look good today...  ~  June 26, 2011:  Add-on appt for right arm numbness & tingling to his fingers of 2 wks duration; he denies neck pain or arm weakness & there has been  no trauma etc, but symptoms has been persistent over the last 2 wks; denies CP, palpit, N/V etc; also c/o some memory loss & under some stress; he notes that he tried a "male enhancer" for 1 wk starting about 2wks prior to these symptoms; we discussed need for MRI Brain & CSpine for further evaluation, in the meanwhile we will treat w/ Klonopin 0.5mg  Bid...    NOTE:  MRI Brain==> normal;  MRI CSpine==> multilevel spondylosis & central disc herniation, plus neural foraminal stenosis bilat compressing C7 nerve roots...  Rec> refer to Neurosurg (Addendum:  Symptoms resolved on their own, pt never saw neurosurg, doing fine now).  ~  February 10, 2012:  7-59mo ROV & Danny [name] has a knot on the right hand 4th finger betw MCP & PIP joints- no pain, norm ROM, ?neuroma? & we will refer to DrSypher for review poss excision...  He notes that he's not resting well, works night shift & poor sleep habits; we discussed better sleep hygiene, he thinks it's his diet 7 asked NOT to eat when he gets home at ... Final concern is report of girlfriend Rx for Trich & we discussed checking UA but he has no urethritis, disch, etc; I indicated to him that we need to treat anyway  w/ Flagyl 500mg Tid x7d...  Ex-smoker> Quit 2008-02-12, father died from lung cancer; breathing is much better since he quit... ED> on viagra as needed; med refilled per request; Note> he reports girlfriend treated for trich- Rx FLAGYL 500mg Tidx7d. Paresthesias> long hx vague symptoms; MRI CSpine 11/12 showed spondylosis, central disc herniation, & C7 nerve root foraminal stenosis; but he reports all symptoms resolved on their own, no recurrence, never saw neurosurg, and doing satis at present... Anxiety> prev counseling from [facility], pt declines anxiolytic rx...    We reviewed prob list, meds, xrays and labs> see below>> CXR 6/13 showed normal hear size, clear lungs, NAD.Marland KitchenMarland Kitchen EKG 6/13 showed SBrady, rate55, early repol, NAD, no change from prev tracings... LABS  6/13:  FLP- at goals on diet alone;  Chems- wnl x BS=106;  CBC- wnl;  TSH=1.49;  PSA=1.08;  UA= clear...   Problem List:  Ex-SMOKER (ICD-305.1) - he quit Jan09 on his own & he has remained off cigarettes, doing well w/o cough, sputum, dyspnea... ~  CXR 6/12 remains clear, NAD.Marland Kitchen. ~  CXR 6/13 showed normal hear size, clear lungs, NAD...  Hx of ELECTROCARDIOGRAM, ABNORMAL (ICD-794.31) - baseline EKG w/ benign early repolarization... he denies CP, palpit, dizzy, syncope, edema, etc... ~  EKG 6/12 showed NSR, early repol changes, NAD... ~  EKG 6/13 showed SBrady, rate55, early repol, NAD, no change from prev tracings...  OTHER ABNORMAL GLUCOSE (ICD-790.29) - hx of FBS as high as 117 in the past... FBS range 92-117 over the last 10 yrs... diet Rx alone- avoid sweets... ~  Labs 6/12 showed FBS= 83 ~  Labs 6/13 showed FBS= 106  Poss Trichomonas infection >> pt noted 6/13 OV that girlfriend was treated for trich; UA is clear & he is asymptomatic; we discussed Rx w/ Flagyl... ERECTILE DYSFUNCTION (ICD-302.72) - on VIAGRA as needed. ~  Labs 6/12 showed Testosterone level = 435, PSA= 0.82... ~  Labs 6/13 showed PSA= 1.08  PARESTHESIA (ICD-782.0) - ** SEE ABOVE ** less noticable now- improved. ~  11/12: presents w/ right arm numbness & MRI Brain is WNL, MRI CSpine w/ multilevel spondylosis, central disc herniation, bilat C7 neural foraminal stenosis etc> refer to Neurosurg==> he never saw surgeon, symptoms resolved on their own & no recurrence so far...  Prob NEUROMA on right hand 4th digit >> pt noted this knot present for ~68mo during 6/13 CPX; refer to DrSypher for his eval & poss excision...  ANXIETY (ICD-300.00) - prev hx counselling from [facility]... he does not want anxiolytic Rx...  HEALTH MAINTENANCE:  51 y/o gentleman... ~  DRE is neg- prostate 2+, no nodules... stool heme neg...   No past surgical history on file.   Outpatient Encounter Prescriptions as of 02/10/2012  Medication  Sig Dispense Refill  . aspirin 325 MG tablet Take 325 mg by mouth daily.        . Multiple Vitamins-Minerals (ONE-A-DAY MENS HEALTH FORMULA) TABS Take 1 tablet by mouth daily.        Marland Kitchen VIAGRA 100 MG tablet USE AS DIRECTED  30 tablet  2  . DISCONTD: clonazePAM (KLONOPIN) 0.5 MG tablet Take 1/2 to 1 tablet by mouth two  times daily as directed  60 tablet  5    No Known Allergies   Current Medications, Allergies, Past Medical History, Past Surgical History, Family History, and Social History were reviewed in [employer] record.   Review of Systems        The patient complains of anxiety.  The  patient denies fever, chills, sweats, anorexia, fatigue, weakness, malaise, weight loss, sleep disorder, blurring, diplopia, eye irritation, eye discharge, vision loss, eye pain, photophobia, earache, ear discharge, tinnitus, decreased hearing, nasal congestion, nosebleeds, sore throat, hoarseness, chest pain, palpitations, syncope, dyspnea on exertion, orthopnea, PND, peripheral edema, cough, dyspnea at rest, excessive sputum, hemoptysis, wheezing, pleurisy, nausea, vomiting, diarrhea, constipation, change in bowel habits, abdominal pain, melena, hematochezia, jaundice, gas/bloating, indigestion/heartburn, dysphagia, odynophagia, dysuria, hematuria, urinary frequency, urinary hesitancy, nocturia, incontinence, back pain, joint pain, joint swelling, muscle cramps, muscle weakness, stiffness, arthritis, sciatica, restless legs, leg pain at night, leg pain with exertion, rash, itching, dryness, suspicious lesions, paralysis, paresthesias, seizures, tremors, vertigo, transient blindness, frequent falls, frequent headaches, difficulty walking, depression, memory loss, confusion, cold intolerance, heat intolerance, polydipsia, polyphagia, polyuria, unusual weight change, abnormal bruising, bleeding, enlarged lymph nodes, urticaria, allergic rash, hay fever, and recurrent infections.     Objective:   Physical Exam      WD, WN, 50 y/o BM in NAD... GENERAL:  Alert & oriented; pleasant & cooperative. HEENT:  [place]/AT, EOM-wnl, PERRLA, Fundi-benign, EACs-clear, TMs-wnl, NOSE-clear, THROAT-clear & wnl. NECK:  Supple w/ full ROM; no JVD; normal carotid impulses w/o bruits; no thyromegaly or nodules palpated; no lymphadenopathy. CHEST:  Clear to P & A; without wheezes/ rales/ or rhonchi. HEART:  Regular Rhythm; without murmurs/ rubs/ or gallops. ABDOMEN:  Soft & nontender; normal bowel sounds; no organomegaly or masses detected. Testicles well descended , no masses noted, femoral pulses intact , no hernias noted, prostate 2+ normal, stool neg. EXT: without deformities or arthritic changes; no varicose veins/ venous insuffic/ or edema. DERM:  No lesions noted; no rash etc...  RADIOLOGY DATA:  Reviewed in the EPIC EMR & discussed w/ the patient...  LABORATORY DATA:  Reviewed in the EPIC EMR & discussed w/ the patient...    Assessment & Plan:   CPX>  He is stable w/ CXR, EKG, Fasting labs all outlined above & OK...  Ex-smoker> he has remained quit & essentially asymptomatic...  Poss Trichomonas exposure>  We discussed Rx w/ Flagyl 500mg  tid x7d...  Right arm numbness & Abn MRI CSpine>  Pt was referred to Neurosurg but symptoms resolved on their own (?benefit from the Klonopin Rx?), no recurrence...  ?Neuroma on right hand 4th digit> referred to DrSypher for further eval...  See Prob List above...   Patient's Medications  New Prescriptions   METRONIDAZOLE (FLAGYL) 500 MG TABLET    Take 1 tablet (500 mg total) by mouth 3 (three) times daily.  Previous Medications   ASPIRIN 325 MG TABLET    Take 325 mg by mouth daily.     MULTIPLE VITAMINS-MINERALS (ONE-A-DAY MENS HEALTH FORMULA) TABS    Take 1 tablet by mouth daily.    Modified Medications   Modified Medication Previous Medication   SILDENAFIL (VIAGRA) 100 MG TABLET VIAGRA 100 MG tablet      Take as directed    USE  AS DIRECTED  Discontinued Medications   CLONAZEPAM (KLONOPIN) 0.5 MG TABLET    Take 1/2 to 1 tablet by mouth two  times daily as directed

## 2012-02-25 ENCOUNTER — Telehealth: Payer: Self-pay | Admitting: Pulmonary Disease

## 2012-02-25 NOTE — Telephone Encounter (Signed)
SN is aware. Nothing further needed. 

## 2012-02-25 NOTE — Telephone Encounter (Signed)
lmomtcb x1 for pt 

## 2012-02-25 NOTE — Telephone Encounter (Signed)
Spoke with patient states that swelling has gone down and feels he does not need to see  as specialist at this time. Advised pt that if swelling returns to call and we would get him  Back into see  Dr Teressa Senter.

## 2012-11-25 ENCOUNTER — Other Ambulatory Visit: Payer: Self-pay | Admitting: Pulmonary Disease

## 2012-12-21 ENCOUNTER — Encounter: Payer: Self-pay | Admitting: Pulmonary Disease

## 2012-12-21 ENCOUNTER — Ambulatory Visit (INDEPENDENT_AMBULATORY_CARE_PROVIDER_SITE_OTHER): Payer: 59 | Admitting: Pulmonary Disease

## 2012-12-21 VITALS — BP 124/60 | HR 77 | Temp 97.3°F | Ht 67.0 in | Wt 178.0 lb

## 2012-12-21 DIAGNOSIS — Z0000 Encounter for general adult medical examination without abnormal findings: Secondary | ICD-10-CM

## 2012-12-21 DIAGNOSIS — M545 Low back pain, unspecified: Secondary | ICD-10-CM

## 2012-12-21 DIAGNOSIS — R209 Unspecified disturbances of skin sensation: Secondary | ICD-10-CM

## 2012-12-21 MED ORDER — CYCLOBENZAPRINE HCL 10 MG PO TABS: 10.0000 mg | ORAL_TABLET | Freq: Three times a day (TID) | ORAL | Status: DC | PRN

## 2012-12-21 MED ORDER — TRAMADOL HCL 50 MG PO TABS: 50.0000 mg | ORAL_TABLET | Freq: Three times a day (TID) | ORAL | Status: DC | PRN

## 2012-12-21 MED ORDER — FIRST-DUKES MOUTHWASH MT SUSP: OROMUCOSAL | Status: DC

## 2012-12-21 NOTE — Patient Instructions (Addendum)
Today we updated your med list in our EPIC system...    Continue your current medications the same...  For your low back pain>>    Try a heating pad & curtail your activity for awhile...    We wrote a prescription for FLEXERIL (Cyclobenzaprine) 10mg - take one tab up to 3 times daily as needed for muscle spasm...    We also wrote for TRAMADOL 50mg - one tab up to 3 times daily as needed for pain...  Please return to our lab one morning this week for your FASTING blood work...    We will contact you w/ the results when available...   Call for any questions...  Let's plan a follow up visit in 72yr, sooner if needed for problems.Marland KitchenMarland Kitchen

## 2012-12-21 NOTE — Progress Notes (Signed)
Subjective:    Patient ID: Danny [name], male    DOB: [birth year], 52 y.o.   MRN: [id number]  HPI 52 y/o BM here for a yearly follow up visit>>  ~  June 26, 2011:  Add-on appt for right arm numbness & tingling to his fingers of 2 wks duration; he denies neck pain or arm weakness & there has been no trauma etc, but symptoms has been persistent over the last 2 wks; denies CP, palpit, N/V etc; also c/o some memory loss & under some stress; he notes that he tried a "male enhancer" for 1 wk starting about 2wks prior to these symptoms; we discussed need for MRI Brain & CSpine for further evaluation, in the meanwhile we will treat w/ Klonopin 0.5mg  Bid...    NOTE:  MRI Brain==> normal;  MRI CSpine==> multilevel spondylosis & central disc herniation, plus neural foraminal stenosis bilat compressing C7 nerve roots...  Rec> refer to Neurosurg (Addendum:  Symptoms resolved on their own, pt never saw neurosurg, doing fine now).  ~  February 10, 2012:  7-31mo ROV & Danny [name] has a knot on the right hand 4th finger betw MCP & PIP joints- no pain, norm ROM, ?neuroma? & we will refer to Danny [name] for review poss excision...  He notes that he's not resting well, works night shift & poor sleep habits; we discussed better sleep hygiene, he thinks it's his diet 7 asked NOT to eat when he gets home at ... Final concern is report of girlfriend Rx for Trich & we discussed checking UA but he has no urethritis, disch, etc; I indicated to him that we need to treat anyway w/ Flagyl 500mg Tid x7d...     Ex-smoker> Quit 31-Dec-2007, father died from lung cancer; breathing is much better since he quit...    ED> on viagra as needed; med refilled per request; Note> he reports girlfriend treated for trich- Rx FLAGYL 500mg Tidx7d.    Paresthesias> long hx vague symptoms; MRI CSpine 11/12 showed spondylosis, central disc herniation, & C7 nerve root foraminal stenosis; but he reports all symptoms resolved on their own, no recurrence, never saw  neurosurg, and doing satis at present...    Anxiety> prev counseling from [facility], pt declines anxiolytic rx...    We reviewed prob list, meds, xrays and labs> see below>> CXR 6/13 showed normal hear size, clear lungs, NAD.Marland KitchenMarland Kitchen EKG 6/13 showed SBrady, rate55, early repol, NAD, no change from prev tracings... LABS 6/13:  FLP- at goals on diet alone;  Chems- wnl x BS=106;  CBC- wnl;  TSH=1.49;  PSA=1.08;  UA= clear...   ~  Dec 21, 2012:  39mo ROV & Danny [name] has mult complaints including sore throat x several days (Rx- [facility]), LBP x3wks, hard to bend over, numbness in right arm but had MRI which was normal; he is not taking any meds regularly... We reviewed the following medical problems during today's office visit >>     Ex-smoker> Quit 2007-12-31, father died from lung cancer; breathing is much better since he quit; last CXR 6/13 was clear...    ED> on Viagra as needed; med refilled per request; Note> we prev treated him for trich w/ FLAGYL 500mg Tidx7d.    LBP> new complaint 5/14 7 we have rec Tramadol50 Tid prn & Flexeril10 Tid prn...    Paresthesias> long hx vague symptoms; MRI CSpine 11/12 showed spondylosis, central disc herniation, & C7 nerve root foraminal stenosis; but he reports all symptoms resolved on their own, no recurrence, never saw neurosurg, and doing  satis at present...    Anxiety> prev counseling from [facility], pt declines anxiolytic rx... We reviewed prob list, meds, xrays and labs> see below for updates >>  LABS 5/14:  He was asked to ret for FASTING blood work but never complied...          Problem List:  Ex-SMOKER (ICD-305.1) - he quit Jan09 on his own & he has remained off cigarettes, doing well w/o cough, sputum, dyspnea... ~  CXR 6/12 remains clear, NAD.Marland Kitchen. ~  CXR 6/13 showed normal hear size, clear lungs, NAD...  Hx of ELECTROCARDIOGRAM, ABNORMAL (ICD-794.31) - baseline EKG w/ benign early repolarization... he denies CP, palpit, dizzy, syncope, edema, etc... ~  EKG 6/12  showed NSR, early repol changes, NAD... ~  EKG 6/13 showed SBrady, rate55, early repol, NAD, no change from prev tracings...  OTHER ABNORMAL GLUCOSE (ICD-790.29) - hx of FBS as high as 117 in the past... FBS range 92-117 over the last 10 yrs... diet Rx alone- avoid sweets... ~  Labs 6/12 showed FBS= 83 ~  Labs 6/13 showed FBS= 106  Poss Trichomonas infection >> pt noted 6/13 OV that girlfriend was treated for trich; UA is clear & he is asymptomatic; we discussed Rx w/ Flagyl... ERECTILE DYSFUNCTION (ICD-302.72) - on VIAGRA as needed. ~  Labs 6/12 showed Testosterone level = 435, PSA= 0.82... ~  Labs 6/13 showed PSA= 1.08  LBP >> ~  Pt mentioned LBP 5/14 OV> no prev eval & he was rec to take Tramadol, Flexeril, rest & heat; he will let us know if he needs to see Ortho for referral...  PARESTHESIA (ICD-782.0) - ** SEE ABOVE ** less noticable now- improved. ~  11/12: presents w/ right arm numbness & MRI Brain is WNL, MRI CSpine w/ multilevel spondylosis, central disc herniation, bilat C7 neural foraminal stenosis etc> refer to Neurosurg==> he never saw surgeon, symptoms resolved on their own & no recurrence so far...  Prob NEUROMA on right hand 4th digit >> pt noted this knot present for ~24mo during 6/13 CPX; refer to Danny [name] for his eval & poss excision...  ANXIETY (ICD-300.00) - prev hx counselling from [facility]... he does not want anxiolytic Rx...  HEALTH MAINTENANCE:  52 y/o gentleman... ~  DRE is neg- prostate 2+, no nodules... stool heme neg...   History reviewed. No pertinent past surgical history.   Outpatient Encounter Prescriptions as of 12/21/2012  Medication Sig Dispense Refill  . aspirin 325 MG tablet Take 325 mg by mouth daily.        . Multiple Vitamins-Minerals (ONE-A-DAY MENS HEALTH FORMULA) TABS Take 1 tablet by mouth daily.        . sildenafil (VIAGRA) 100 MG tablet Take as directed  10 tablet  6  . VIAGRA 100 MG tablet USE AS DIRECTED  10 tablet  2   No  facility-administered encounter medications on file as of 12/21/2012.    No Known Allergies   Current Medications, Allergies, Past Medical History, Past Surgical History, Family History, and Social History were reviewed in [employer] record.   Review of Systems        The patient complains of anxiety.  The patient denies fever, chills, sweats, anorexia, fatigue, weakness, malaise, weight loss, sleep disorder, blurring, diplopia, eye irritation, eye discharge, vision loss, eye pain, photophobia, earache, ear discharge, tinnitus, decreased hearing, nasal congestion, nosebleeds, sore throat, hoarseness, chest pain, palpitations, syncope, dyspnea on exertion, orthopnea, PND, peripheral edema, cough, dyspnea at rest, excessive sputum, hemoptysis, wheezing, pleurisy,  nausea, vomiting, diarrhea, constipation, change in bowel habits, abdominal pain, melena, hematochezia, jaundice, gas/bloating, indigestion/heartburn, dysphagia, odynophagia, dysuria, hematuria, urinary frequency, urinary hesitancy, nocturia, incontinence, back pain, joint pain, joint swelling, muscle cramps, muscle weakness, stiffness, arthritis, sciatica, restless legs, leg pain at night, leg pain with exertion, rash, itching, dryness, suspicious lesions, paralysis, paresthesias, seizures, tremors, vertigo, transient blindness, frequent falls, frequent headaches, difficulty walking, depression, memory loss, confusion, cold intolerance, heat intolerance, polydipsia, polyphagia, polyuria, unusual weight change, abnormal bruising, bleeding, enlarged lymph nodes, urticaria, allergic rash, hay fever, and recurrent infections.    Objective:   Physical Exam      WD, WN, 51 y/o BM in NAD... GENERAL:  Alert & oriented; pleasant & cooperative. HEENT:  [place]/AT, EOM-wnl, PERRLA, Fundi-benign, EACs-clear, TMs-wnl, NOSE-clear, THROAT-clear & wnl. NECK:  Supple w/ full ROM; no JVD; normal carotid impulses w/o bruits; no thyromegaly  or nodules palpated; no lymphadenopathy. CHEST:  Clear to P & A; without wheezes/ rales/ or rhonchi. HEART:  Regular Rhythm; without murmurs/ rubs/ or gallops. ABDOMEN:  Soft & nontender; normal bowel sounds; no organomegaly or masses detected. Testicles well descended , no masses noted, femoral pulses intact , no hernias noted, prostate 2+ normal, stool neg. EXT: without deformities or arthritic changes; no varicose veins/ venous insuffic/ or edema. DERM:  No lesions noted; no rash etc...  RADIOLOGY DATA:  Reviewed in the EPIC EMR & discussed w/ the patient...  LABORATORY DATA:  Reviewed in the EPIC EMR & discussed w/ the patient...    Assessment & Plan:  .  Ex-smoker> he has remained quit & essentially asymptomatic...  ED>  Refill viagra for prn use...  LBP>  We discussed Tramadol, Flexeril, etc...  Right arm numbness & Abn MRI CSpine>  Pt was referred to Neurosurg but symptoms resolved on their own (?benefit from the Klonopin Rx?), no recurrence...  See Prob List above...   Patient's Medications  New Prescriptions   CYCLOBENZAPRINE (FLEXERIL) 10 MG TABLET    Take 1 tablet (10 mg total) by mouth 3 (three) times daily as needed for muscle spasms.   DIPHENHYD-HYDROCORT-NYSTATIN (FIRST-DUKES MOUTHWASH) SUSP    1 teaspoon garlge and swallow four times daily as needed   TRAMADOL (ULTRAM) 50 MG TABLET    Take 1 tablet (50 mg total) by mouth 3 (three) times daily as needed for pain.  Previous Medications   ASPIRIN 325 MG TABLET    Take 325 mg by mouth daily.     MULTIPLE VITAMINS-MINERALS (ONE-A-DAY MENS HEALTH FORMULA) TABS    Take 1 tablet by mouth daily.    Modified Medications   Modified Medication Previous Medication   VIAGRA 100 MG TABLET sildenafil (VIAGRA) 100 MG tablet      TAKE AS DIRECTED    Take as directed  Discontinued Medications   VIAGRA 100 MG TABLET    USE AS DIRECTED

## 2013-03-18 ENCOUNTER — Other Ambulatory Visit: Payer: Self-pay | Admitting: Pulmonary Disease

## 2013-09-20 ENCOUNTER — Other Ambulatory Visit: Payer: Self-pay | Admitting: Pulmonary Disease

## 2014-04-20 ENCOUNTER — Ambulatory Visit (INDEPENDENT_AMBULATORY_CARE_PROVIDER_SITE_OTHER): Payer: 59 | Admitting: Internal Medicine

## 2014-04-20 ENCOUNTER — Encounter: Payer: Self-pay | Admitting: Internal Medicine

## 2014-04-20 VITALS — BP 138/78 | HR 60 | Temp 98.0°F | Wt 179.5 lb

## 2014-04-20 DIAGNOSIS — M545 Low back pain: Secondary | ICD-10-CM

## 2014-04-20 DIAGNOSIS — M4722 Other spondylosis with radiculopathy, cervical region: Secondary | ICD-10-CM

## 2014-04-20 DIAGNOSIS — R7303 Prediabetes: Secondary | ICD-10-CM

## 2014-04-20 DIAGNOSIS — R7309 Other abnormal glucose: Secondary | ICD-10-CM

## 2014-04-20 DIAGNOSIS — F528 Other sexual dysfunction not due to a substance or known physiological condition: Secondary | ICD-10-CM

## 2014-04-20 DIAGNOSIS — Z0000 Encounter for general adult medical examination without abnormal findings: Secondary | ICD-10-CM

## 2014-04-20 DIAGNOSIS — Z23 Encounter for immunization: Secondary | ICD-10-CM

## 2014-04-20 DIAGNOSIS — R9431 Abnormal electrocardiogram [ECG] [EKG]: Secondary | ICD-10-CM

## 2014-04-20 DIAGNOSIS — F411 Generalized anxiety disorder: Secondary | ICD-10-CM

## 2014-04-20 LAB — COMPREHENSIVE METABOLIC PANEL
ALT: 20 U/L (ref 0–53)
AST: 17 U/L (ref 0–37)
Albumin: 3.9 g/dL (ref 3.5–5.2)
Alkaline Phosphatase: 55 U/L (ref 39–117)
BILIRUBIN TOTAL: 1 mg/dL (ref 0.2–1.2)
BUN: 15 mg/dL (ref 6–23)
CO2: 28 meq/L (ref 19–32)
Calcium: 9.4 mg/dL (ref 8.4–10.5)
Chloride: 105 mEq/L (ref 96–112)
Creatinine, Ser: 1.2 mg/dL (ref 0.4–1.5)
GFR: 83.8 mL/min (ref >60.00)
Glucose, Bld: 114 mg/dL — ABNORMAL HIGH (ref 70–99)
Potassium: 5 mEq/L (ref 3.5–5.1)
Sodium: 141 mEq/L (ref 135–145)
Total Protein: 6.9 g/dL (ref 6.0–8.3)

## 2014-04-20 LAB — CBC WITH DIFFERENTIAL/PLATELET
BASOS ABS: 0.1 10*3/uL (ref 0.0–0.1)
Basophils Relative: 1.9 % (ref 0.0–3.0)
EOS PCT: 2.2 % (ref 0.0–5.0)
Eosinophils Absolute: 0.1 10*3/uL (ref 0.0–0.7)
HCT: 43.8 % (ref 39.0–52.0)
Hemoglobin: 14.7 g/dL (ref 13.0–17.0)
Lymphocytes Relative: 30.8 % (ref 12.0–46.0)
Lymphs Abs: 1.2 10*3/uL (ref 0.7–4.0)
MCHC: 33.5 g/dL (ref 30.0–36.0)
MCV: 91 fl (ref 78.0–100.0)
MONOS PCT: 14.8 % — AB (ref 3.0–12.0)
Monocytes Absolute: 0.6 10*3/uL (ref 0.1–1.0)
NEUTROS ABS: 2 10*3/uL (ref 1.4–7.7)
Neutrophils Relative %: 50.3 % (ref 43.0–77.0)
Platelets: 264 10*3/uL (ref 150.0–400.0)
RBC: 4.81 Mil/uL (ref 4.22–5.81)
RDW: 13.2 % (ref 11.5–15.5)
WBC: 3.9 10*3/uL — ABNORMAL LOW (ref 4.0–10.5)

## 2014-04-20 LAB — LIPID PANEL
Cholesterol: 163 mg/dL (ref 0–200)
HDL: 38.3 mg/dL — ABNORMAL LOW (ref >39.00)
LDL CALC: 116 mg/dL — AB (ref 0–99)
NONHDL: 124.7
Total CHOL/HDL Ratio: 4
Triglycerides: 45 mg/dL (ref 0.0–149.0)
VLDL: 9 mg/dL (ref 0.0–40.0)

## 2014-04-20 LAB — HEMOGLOBIN A1C: Hgb A1c MFr Bld: 6 % (ref 4.6–6.5)

## 2014-04-20 LAB — PSA: PSA: 1.58 ng/mL (ref 0.10–4.00)

## 2014-04-20 MED ORDER — SILDENAFIL CITRATE 100 MG PO TABS
50.0000 mg | ORAL_TABLET | Freq: Every day | ORAL | Status: DC | PRN
Start: 2014-04-20 — End: 2014-10-14

## 2014-04-20 NOTE — Assessment & Plan Note (Signed)
Notes from Dr Lenna Gilford reviewed:  MRI Brain==> normal; MRI CSpine==> multilevel spondylosis & central disc herniation, plus neural foraminal stenosis bilat compressing C7 nerve roots... Rec> refer to Neurosurg (Addendum: Symptoms resolved on their own, pt never saw neurosurg, doing fine now).

## 2014-04-20 NOTE — Patient Instructions (Signed)
Get your blood work before you leave     Please come back to the office in 1 year Stop by the front desk and schedule the visit   Reason for the visit: physical exam   Fasting ?   Yes

## 2014-04-20 NOTE — Assessment & Plan Note (Signed)
Last Td ~ 10 years ago, Tdap today Prostate cancer screening: DRE today normal, will check a PSA Colon cancer screening, 3 modalities discussed, he elected a colonoscopy, will refer to GI Diet and exercise discussed Labs

## 2014-04-20 NOTE — Assessment & Plan Note (Signed)
Resolved

## 2014-04-20 NOTE — Progress Notes (Signed)
Pre visit review using our clinic review tool, if applicable. No additional management support is needed unless otherwise documented below in the visit note. 

## 2014-04-20 NOTE — Progress Notes (Signed)
   Subjective:    Patient ID: Danny [name], male    DOB: 29-Apr-1961, 53 y.o.   MRN: [id number]  DOS:  04/20/2014 Type of visit - description : transferring from Dr Lenna Gilford, CPX Interval history: In general doing well, needs a Rx for viagra  ROS Denies chest pain or difficulty breathing, no lower extremity edema No nausea, vomiting, diarrhea blood in the stools. No anxiety or depression at the present time. No dysuria, gross motor or difficulty urinating  Past Medical History  Diagnosis Date  . Cigarette smoker   . Abnormal electrocardiogram   . Abnormal glucose   . Erectile dysfunction   . Paresthesia   . Anxiety     Past Surgical History  Procedure Laterality Date  . No past surgeries      Family History  Problem Relation Age of Onset  . Prostate cancer Other     2 uncles and grandfather  . Colon cancer Other     GM, in her 35s  . CAD Neg Hx   . Stroke Neg Hx   . Diabetes Neg Hx      History   Social History  . Marital Status: Single    Spouse Name: N/A    Number of Children: 1  . Years of Education: N/A   Occupational History  . 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 Smoking status: Former Smoker -- 0.50 packs/day for 10 years    Types: Cigarettes    Quit date: 08/20/2007  . Smokeless tobacco: Never Used     Comment: 2009  . Alcohol Use: Yes     Comment: beer  . Drug Use: No  . Sexual Activity: Not on file   Other Topics Concern  . Not on file   Social History Narrative   Lives by himself        Medication List       This list is accurate as of: 04/20/14  4:50 PM.  Always use your most recent med list.               ONE-A-DAY MENS HEALTH FORMULA Tabs  Take 1 tablet by mouth daily.     sildenafil 100 MG tablet  Commonly known as:  VIAGRA  Take 0.5-1 tablets (50-100 mg total) by mouth daily as needed for erectile dysfunction.           Objective:   Physical Exam BP 138/78  Pulse 60  Temp(Src)  98 F (36.7 C) (Oral)  Wt 179 lb 7.3 oz (81.4 kg)  SpO2 98%  General -- alert, well-developed, NAD.  Neck --no thyromegaly  HEENT-- Not pale.  Lungs -- normal respiratory effort, no intercostal retractions, no accessory muscle use, and normal breath sounds.  Heart-- normal rate, regular rhythm, no murmur.  Abdomen-- Not distended, good bowel sounds,soft, non-tender. Rectal-- No external abnormalities noted. Normal sphincter tone. No rectal masses or tenderness. Stool brown, Hemoccult negative  Prostate--Prostate gland firm and smooth, no enlargement, nodularity, tenderness, mass, asymmetry or induration. Extremities-- no pretibial edema bilaterally  Neurologic--  alert & oriented X3. Speech normal, gait appropriate for age, strength symmetric and appropriate for age.  Psych-- Cognition and judgment appear intact. Cooperative with normal attention span and concentration. No anxious or depressed appearing.      Assessment & Plan:

## 2014-04-20 NOTE — Assessment & Plan Note (Signed)
History of early repolarization, asymptomatic

## 2014-04-20 NOTE — Assessment & Plan Note (Signed)
Not an issue at this point 

## 2014-04-20 NOTE — Assessment & Plan Note (Signed)
Refill of Viagra provided

## 2014-04-20 NOTE — Assessment & Plan Note (Signed)
2011--- A1c was 5.8. Repeat A1c

## 2014-10-06 ENCOUNTER — Telehealth: Payer: Self-pay | Admitting: Internal Medicine

## 2014-10-06 DIAGNOSIS — Z1211 Encounter for screening for malignant neoplasm of colon: Secondary | ICD-10-CM

## 2014-10-06 NOTE — Telephone Encounter (Signed)
GI referral placed

## 2014-10-06 NOTE — Telephone Encounter (Signed)
Pt requesting GI referral for cscope.

## 2014-10-06 NOTE — Telephone Encounter (Signed)
Arrange the GI referral

## 2014-10-06 NOTE — Telephone Encounter (Signed)
Caller name: Mase, Dhondt Relation to pt: self  Call back number:  [phone] Pharmacy: [id number] [place], [place] [phone] (Phone) [phone] (Fax)       Reason for call:   Pt is requesting a 30 day supply of sildenafil (VIAGRA) 100 MG tablet in need prior-auth  [facility] choice member ID [id number] Group #[id number]  Pt is requesting a referral for colonoscopy

## 2014-10-07 NOTE — Telephone Encounter (Signed)
Patient calling back in regarding the viagra refill.

## 2014-10-07 NOTE — Telephone Encounter (Signed)
Pt called regarding Viagra PA needed.

## 2014-10-10 NOTE — Telephone Encounter (Signed)
PA initiated. Awaiting determination. JG//CMA 

## 2014-10-14 MED ORDER — SILDENAFIL CITRATE 100 MG PO TABS: 50.0000 mg | ORAL_TABLET | Freq: Every day | ORAL | Status: DC | PRN

## 2014-10-14 NOTE — Telephone Encounter (Signed)
Pt is requesting refill on Viagra.   Last OV: 04/20/2014  Last Fill: 04/20/2014 # 10 [id number]  Please advise.

## 2014-10-14 NOTE — Telephone Encounter (Signed)
[facility].

## 2014-10-14 NOTE — Addendum Note (Signed)
Addended by: Wilfrid Lund on: 10/14/2014 02:21 PM   Modules accepted: Orders

## 2014-10-14 NOTE — Telephone Encounter (Signed)
Pt states that he contacted [facility] and they informed him that he already has prior authorization and only needs dr. Larose Kells to write an rx for 30 days and send it to them. Pt upset states he needs the rx.

## 2014-10-14 NOTE — Telephone Encounter (Signed)
Okay to refill Viagra ---->  #10, 5 RF GI referral already in

## 2015-04-05 ENCOUNTER — Telehealth: Payer: Self-pay | Admitting: Internal Medicine

## 2015-04-05 NOTE — Telephone Encounter (Signed)
pre visit letter mailed 04/04/15 °

## 2015-04-21 ENCOUNTER — Encounter: Payer: Self-pay | Admitting: *Deleted

## 2015-04-21 ENCOUNTER — Telehealth: Payer: Self-pay | Admitting: *Deleted

## 2015-04-21 MED ORDER — SILDENAFIL CITRATE 100 MG PO TABS: 50.0000 mg | ORAL_TABLET | Freq: Every day | ORAL | Status: DC | PRN

## 2015-04-21 NOTE — Telephone Encounter (Signed)
Pre-Visit Call completed with patient and chart updated.   Pre-Visit Info documented in Specialty Comments under [employer].    

## 2015-04-25 ENCOUNTER — Ambulatory Visit (HOSPITAL_BASED_OUTPATIENT_CLINIC_OR_DEPARTMENT_OTHER)
Admission: RE | Admit: 2015-04-25 | Discharge: 2015-04-25 | Disposition: A | Discharge status: Home / Self Care | Payer: Commercial Managed Care - HMO | Source: Ambulatory Visit | Attending: Internal Medicine | Admitting: Internal Medicine

## 2015-04-25 ENCOUNTER — Ambulatory Visit (INDEPENDENT_AMBULATORY_CARE_PROVIDER_SITE_OTHER): Payer: Commercial Managed Care - HMO | Admitting: Internal Medicine

## 2015-04-25 ENCOUNTER — Encounter: Payer: Self-pay | Admitting: Internal Medicine

## 2015-04-25 VITALS — BP 116/62 | HR 67 | Temp 98.4°F | Ht 68.25 in | Wt 172.1 lb

## 2015-04-25 DIAGNOSIS — Z1211 Encounter for screening for malignant neoplasm of colon: Secondary | ICD-10-CM

## 2015-04-25 DIAGNOSIS — Z801 Family history of malignant neoplasm of trachea, bronchus and lung: Secondary | ICD-10-CM | POA: Diagnosis not present

## 2015-04-25 DIAGNOSIS — Z0000 Encounter for general adult medical examination without abnormal findings: Secondary | ICD-10-CM | POA: Diagnosis not present

## 2015-04-25 DIAGNOSIS — Z1159 Encounter for screening for other viral diseases: Secondary | ICD-10-CM

## 2015-04-25 DIAGNOSIS — Z09 Encounter for follow-up examination after completed treatment for conditions other than malignant neoplasm: Secondary | ICD-10-CM

## 2015-04-25 DIAGNOSIS — Z114 Encounter for screening for human immunodeficiency virus [HIV]: Secondary | ICD-10-CM

## 2015-04-25 DIAGNOSIS — E049 Nontoxic goiter, unspecified: Secondary | ICD-10-CM

## 2015-04-25 LAB — BASIC METABOLIC PANEL
BUN: 19 mg/dL (ref 6–23)
CHLORIDE: 106 meq/L (ref 96–112)
CO2: 27 meq/L (ref 19–32)
CREATININE: 0.91 mg/dL (ref 0.40–1.50)
Calcium: 9 mg/dL (ref 8.4–10.5)
GFR: 112.65 mL/min (ref >60.00)
Glucose, Bld: 104 mg/dL — ABNORMAL HIGH (ref 70–99)
Potassium: 3.7 mEq/L (ref 3.5–5.1)
SODIUM: 141 meq/L (ref 135–145)

## 2015-04-25 LAB — MICROALBUMIN / CREATININE URINE RATIO
Creatinine,U: 276.9 mg/dL
Microalb Creat Ratio: 0.3 mg/g (ref 0.0–30.0)
Microalb, Ur: 0.9 mg/dL (ref 0.0–1.9)

## 2015-04-25 LAB — HEMOGLOBIN A1C: Hgb A1c MFr Bld: 5.9 % (ref 4.6–6.5)

## 2015-04-25 LAB — LIPID PANEL
CHOL/HDL RATIO: 3
Cholesterol: 148 mg/dL (ref 0–200)
HDL: 47.6 mg/dL (ref >39.00)
LDL CALC: 93 mg/dL (ref 0–99)
NonHDL: 100.5
TRIGLYCERIDES: 38 mg/dL (ref 0.0–149.0)
VLDL: 7.6 mg/dL (ref 0.0–40.0)

## 2015-04-25 LAB — HEPATITIS C ANTIBODY: HCV AB: NEGATIVE

## 2015-04-25 NOTE — Assessment & Plan Note (Signed)
Prediabetes: Diet and exercise discussed, he did talk to a dietitian but  states "I  was  not interested on what she had to say" Slightly enlarged thyroid gland? [place]  ultrasound Has a family history lung cancer, former smoker: adamant about checking x-rays yearly. Discussed with the patient that the proper way to screen for lung cancer is a CAT scan and i offered to order that, he is really not ready for a CT and  again request a chest x-ray which will be done. Risk of XRs exposure also discussed. ED: On Viagra, not working as well as before. MUSE?  >>>declined

## 2015-04-25 NOTE — Patient Instructions (Signed)
Get your blood work before you leave   Stop by the first floor and get the XR      Next visit  for a physical exam in one year Please schedule an appointment at the front desk Please come back fasting

## 2015-04-25 NOTE — Progress Notes (Signed)
Subjective:    Patient ID: Danny [name], male    DOB: April 01, 1961, 54 y.o.   MRN: [id number]  DOS:  04/25/2015 Type of visit - description : Complete physical exam Interval history: Concerned about the fact that he is not getting yearly chest x-rays. Occasional itchy at the arms, no rash.    Review of Systems Constitutional: No fever. No chills. No unexplained wt changes. No unusual sweats  HEENT: No dental problems, no ear discharge, no facial swelling, no voice changes. No eye discharge, no eye  redness , no  intolerance to light   Respiratory: No wheezing , no  difficulty breathing. No cough , no mucus production  Cardiovascular: No CP, no leg swelling , no  Palpitations  GI: no nausea, no vomiting, no diarrhea , no  abdominal pain.  No blood in the stools. No dysphagia, no odynophagia    Endocrine: No polyphagia, no polyuria , no polydipsia  GU: No dysuria, gross hematuria, difficulty urinating. No urinary urgency, no frequency.  Musculoskeletal: No joint swellings or unusual aches or pains  Skin: No change in the color of the skin, palor , no  Rash  Allergic, immunologic: No environmental allergies , no  food allergies  Neurological: No dizziness no  syncope. No headaches. No diplopia, no slurred, no slurred speech, no motor deficits, no facial  Numbness  Hematological: No enlarged lymph nodes, no easy bruising , no unusual bleedings  Psychiatry: No suicidal ideas, no hallucinations, no beavior problems, no confusion.  No unusual/severe anxiety, no depression   Past Medical History  Diagnosis Date  . Cigarette smoker   . Abnormal electrocardiogram   . Abnormal glucose   . Erectile dysfunction   . Paresthesia   . Anxiety     Past Surgical History  Procedure Laterality Date  . No past surgeries      Social History   Social History  . Marital Status: Single    Spouse Name: N/A  . Number of Children: 1  . Years of Education: N/A   Occupational  History  . 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 Smoking status: Former Smoker -- 0.50 packs/day for 10 years    Types: Cigarettes    Quit date: 08/20/2007  . Smokeless tobacco: Never Used     Comment: 2009  . Alcohol Use: Yes     Comment: beer  . Drug Use: No  . Sexual Activity: Not on file   Other Topics Concern  . Not on file   Social History Narrative   Lives by himself      Family History  Problem Relation Age of Onset  . Prostate cancer Other     2 uncles and grandfather  . Colon cancer Other     GM, in her 83s  . CAD Neg Hx   . Stroke Neg Hx   . Diabetes Neg Hx   . Lung cancer Father     age 69, smoker       Medication List       This list is accurate as of: 04/25/15  1:08 PM.  Always use your most recent med list.               ONE-A-DAY MENS HEALTH FORMULA Tabs  Take 1 tablet by mouth daily.     sildenafil 100 MG tablet  Commonly known as:  VIAGRA  Take 0.5-1 tablets (50-100 mg total) by mouth daily as needed  for erectile dysfunction.           Objective:   Physical Exam BP 116/62 mmHg  Pulse 67  Temp(Src) 98.4 F (36.9 C) (Oral)  Ht 5' 8.25" (1.734 m)  Wt 172 lb 2 oz (78.075 kg)  BMI 25.97 kg/m2  SpO2 99% General:   Well developed, well nourished . NAD.  Neck:  Full range of motion. Supple. No  thyromegaly , normal carotid pulse HEENT:  Normocephalic . Face symmetric, atraumatic Neck: Slightly enlarged left thyroid?Marland Kitchen Not nodular or tender Lungs:  CTA B Normal respiratory effort, no intercostal retractions, no accessory muscle use. Heart: RRR,  no murmur.  No pretibial edema bilaterally  Abdomen:  Not distended, soft, non-tender. No rebound or rigidity.   Skin: Exposed areas without rash. Not pale. Not jaundice Neurologic:  alert & oriented X3.  Speech normal, gait appropriate for age and unassisted Strength symmetric and appropriate for age.  Psych: Cognition and judgment appear intact.    Cooperative with normal attention span and concentration.  Behavior appropriate. No anxious or depressed appearing.    Assessment & Plan:    A/P Prediabetes: Diet and exercise discussed, he did talk to a dietitian but  states "I  was  not interested on what she had to say" Slightly enlarged thyroid gland? [place]  ultrasound Has a family history lung cancer, former smoker: adamant about checking x-rays yearly. Discussed with the patient that the proper way to screen for lung cancer is a CAT scan and i offered to order that, he is really not ready for a CT and  again request a chest x-ray which will be done. Risk of XRs exposure also discussed. ED: On Viagra, not working as well as before. MUSE?  >>>declined

## 2015-04-25 NOTE — Assessment & Plan Note (Addendum)
Last Td -2015, rec flu shot , will get at work Prostate cancer screening: DRE- PSA 2015 normal Colon cancer screening was referred  to GI but cscope was not done, refer again  Diet and exercise discussed Labs: BMP, FLP, A1c, micral, TSH, HIV, hep C  ollow-up one year

## 2015-04-25 NOTE — Progress Notes (Signed)
Pre visit review using our clinic review tool, if applicable. No additional management support is needed unless otherwise documented below in the visit note. 

## 2015-04-26 LAB — HIV ANTIBODY (ROUTINE TESTING W REFLEX): HIV 1&2 Ab, 4th Generation: NONREACTIVE

## 2015-05-02 ENCOUNTER — Ambulatory Visit (HOSPITAL_BASED_OUTPATIENT_CLINIC_OR_DEPARTMENT_OTHER)
Admission: RE | Admit: 2015-05-02 | Discharge: 2015-05-02 | Disposition: A | Discharge status: Home / Self Care | Payer: Commercial Managed Care - HMO | Source: Ambulatory Visit | Attending: Internal Medicine | Admitting: Internal Medicine

## 2015-05-02 ENCOUNTER — Telehealth: Payer: Self-pay | Admitting: Internal Medicine

## 2015-05-02 DIAGNOSIS — E042 Nontoxic multinodular goiter: Secondary | ICD-10-CM | POA: Diagnosis not present

## 2015-05-02 DIAGNOSIS — E010 Iodine-deficiency related diffuse (endemic) goiter: Secondary | ICD-10-CM | POA: Diagnosis present

## 2015-05-02 NOTE — Telephone Encounter (Signed)
°  Relation to QK:MMNO Call back number:[phone] Pharmacy:  Reason for call: pt states he never received his results from his lab work last week, states dr. Larose Kells might be waiting on his ultrasound results but he was just checking.

## 2015-05-02 NOTE — Telephone Encounter (Signed)
LMOM informing Pt that results were placed in mail to him on 04/26/2015, however informed him that his lab results and chest x-ray were normal. Informed him I would reprint the lab results and remail them. Also, informed Pt that we have not received the results of his ultrasound yet but once we do I or Dr. Larose Kells will give him a call. Instructed him to call if he has any questions or concerns.

## 2015-09-06 ENCOUNTER — Encounter: Payer: Self-pay | Admitting: Internal Medicine

## 2015-09-06 ENCOUNTER — Ambulatory Visit (INDEPENDENT_AMBULATORY_CARE_PROVIDER_SITE_OTHER): Payer: Commercial Managed Care - HMO | Admitting: Internal Medicine

## 2015-09-06 VITALS — BP 122/68 | HR 70 | Temp 98.2°F | Ht 68.25 in | Wt 180.5 lb

## 2015-09-06 DIAGNOSIS — M545 Low back pain, unspecified: Secondary | ICD-10-CM

## 2015-09-06 DIAGNOSIS — Z125 Encounter for screening for malignant neoplasm of prostate: Secondary | ICD-10-CM | POA: Diagnosis not present

## 2015-09-06 LAB — URINALYSIS, ROUTINE W REFLEX MICROSCOPIC
BILIRUBIN URINE: NEGATIVE
HGB URINE DIPSTICK: NEGATIVE
KETONES UR: NEGATIVE
LEUKOCYTES UA: NEGATIVE
NITRITE: NEGATIVE
RBC / HPF: NONE SEEN (ref 0–?)
Specific Gravity, Urine: 1.025 (ref 1.000–1.030)
TOTAL PROTEIN, URINE-UPE24: NEGATIVE
URINE GLUCOSE: NEGATIVE
UROBILINOGEN UA: 0.2 (ref 0.0–1.0)
pH: 5.5 (ref 5.0–8.0)

## 2015-09-06 LAB — CBC WITH DIFFERENTIAL/PLATELET
BASOS ABS: 0 10*3/uL (ref 0.0–0.1)
BASOS PCT: 0.5 % (ref 0.0–3.0)
EOS ABS: 0.1 10*3/uL (ref 0.0–0.7)
Eosinophils Relative: 2.8 % (ref 0.0–5.0)
HEMATOCRIT: 44.1 % (ref 39.0–52.0)
HEMOGLOBIN: 14.7 g/dL (ref 13.0–17.0)
LYMPHS PCT: 31.1 % (ref 12.0–46.0)
Lymphs Abs: 1.5 10*3/uL (ref 0.7–4.0)
MCHC: 33.3 g/dL (ref 30.0–36.0)
MCV: 92.2 fl (ref 78.0–100.0)
Monocytes Absolute: 0.7 10*3/uL (ref 0.1–1.0)
Monocytes Relative: 14.7 % — ABNORMAL HIGH (ref 3.0–12.0)
Neutro Abs: 2.5 10*3/uL (ref 1.4–7.7)
Neutrophils Relative %: 50.9 % (ref 43.0–77.0)
Platelets: 280 10*3/uL (ref 150.0–400.0)
RBC: 4.78 Mil/uL (ref 4.22–5.81)
RDW: 13.4 % (ref 11.5–15.5)
WBC: 5 10*3/uL (ref 4.0–10.5)

## 2015-09-06 LAB — SEDIMENTATION RATE: SED RATE: 14 mm/h (ref 0–22)

## 2015-09-06 LAB — PSA: PSA: 1.95 ng/mL (ref 0.10–4.00)

## 2015-09-06 MED ORDER — SILDENAFIL CITRATE 100 MG PO TABS: 50.0000 mg | ORAL_TABLET | Freq: Every day | ORAL | Status: DC | PRN

## 2015-09-06 MED ORDER — PREDNISONE 10 MG PO TABS: ORAL_TABLET | ORAL | Status: DC

## 2015-09-06 MED ORDER — METHOCARBAMOL 750 MG PO TABS: 750.0000 mg | ORAL_TABLET | Freq: Three times a day (TID) | ORAL | Status: DC | PRN

## 2015-09-06 NOTE — Patient Instructions (Addendum)
BEFORE YOU LEAVE THE OFFICE:  GO TO THE LAB  Get the blood work  , urine sample   GO TO THE FRONT DESK chedule a complete physical exam to be done by 04-2016 Please be fasting       AFTER YOU LEAVE THE OFFICE:  Rest  Warm compress Take prednisone as prescribed Robaxin, a muscle relaxant as needed. Will cause drowsiness  Tylenol  500 mg OTC 2 tabs a day every 8 hours as needed for pain or IBUPROFEN (Advil or Motrin) 200 mg 2 tablets every 6 hours as needed for pain.  Always take it with food because may cause gastritis and ulcers.  If you notice nausea, stomach pain, change in the color of stools --->  Stop the medicine and let us know  Call if not gradually improving

## 2015-09-06 NOTE — Progress Notes (Signed)
Pre visit review using our clinic review tool, if applicable. No additional management support is needed unless otherwise documented below in the visit note. 

## 2015-09-06 NOTE — Progress Notes (Signed)
Subjective:    Patient ID: Danny [name], male    DOB: [birth year], 55 y.o.   MRN: [id number]  DOS:  09/06/2015 Type of visit - description : Acute, back pain Interval history: Symptoms started 3 or 4 weeks ago with low back pain, worse at night when he lays down in bed, some nights it has disrupted his sleep. Pain  somewhat radiates bilaterally to the anterior abdomen. Not exacerbated by standing upright, bending/turning his torso During the summer, he did some lifting, had back pain but that was resolved, no recent injury. Also for the last 2 weeks it has been a little hard to urinate, no other symptoms.   Review of Systems   no fever chills No lower extremity edema or paresthesias No bladder or bowel incontinence. No dysuria or gross hematuria.  Past Medical History  Diagnosis Date  . Cigarette smoker   . Abnormal electrocardiogram   . Abnormal glucose   . Erectile dysfunction   . Paresthesia   . Anxiety     Past Surgical History  Procedure Laterality Date  . No past surgeries      Social History   Social History  . Marital Status: Single    Spouse Name: N/A  . Number of Children: 1  . Years of Education: N/A   Occupational History  . 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 Smoking status: Former Smoker -- 0.50 packs/day for 10 years    Types: Cigarettes    Quit date: 08/20/2007  . Smokeless tobacco: Never Used     Comment: 2009  . Alcohol Use: Yes     Comment: beer  . Drug Use: No  . Sexual Activity: Not on file   Other Topics Concern  . Not on file   Social History Narrative   Lives by himself         Medication List       This list is accurate as of: 09/06/15 11:59 PM.  Always use your most recent med list.               methocarbamol 750 MG tablet  Commonly known as:  ROBAXIN  Take 1 tablet (750 mg total) by mouth every 8 (eight) hours as needed for muscle spasms.     ONE-A-DAY MENS HEALTH FORMULA  Tabs  Take 1 tablet by mouth daily.     predniSONE 10 MG tablet  Commonly known as:  DELTASONE  4 tablets x 2 days, 3 tabs x 2 days, 2 tabs x 2 days, 1 tab x 2 days     sildenafil 100 MG tablet  Commonly known as:  VIAGRA  Take 0.5-1 tablets (50-100 mg total) by mouth daily as needed for erectile dysfunction.           Objective:   Physical Exam BP 122/68 mmHg  Pulse 70  Temp(Src) 98.2 F (36.8 C) (Oral)  Ht 5' 8.25" (1.734 m)  Wt 180 lb 8 oz (81.874 kg)  BMI 27.23 kg/m2  SpO2 98% General:   Well developed, well nourished . NAD.  HEENT:  Normocephalic . Face symmetric, atraumatic Abdomen:  Not distended, soft, non-tender. No rebound or rigidity. No mass,organomegaly DRE: Normal sphincter tone, prostate not enlarged, nontender Skin: Not pale. Not jaundice Neurologic:  alert & oriented X3.  Speech normal, gait appropriate for age and unassisted. DTRs/strength: Symmetric Temperature not antalgic Psych--  Cognition and judgment appear intact.  Cooperative with normal attention  span and concentration.  Behavior appropriate. No anxious or depressed appearing.    Assessment & Plan:   Assessment Prediabetes MSK: Cervical spondylosis Thyroid nodules per ultrasound 04-2015: "Follow-up by clinical exam is recommended. If patient has known risk factors for thyroid carcinoma, consider follow-up ultrasound in 12 months. If patient is clinically hyperthyroid, consider nuclear medicine thyroid uptake and scan " ED ++FH lun cancer  Smoker  H/o early repol on [facility]/o anxiety   PLAN: Back pain: Back pain for 3 or 4 weeks, predominantly nocturnal (due to position at bed time?). No fever or chills, some LUTS, DRE (-). Plan: UA, CBC, sedimentation rate. Also check a PSA. Treat as MSK issue: Prednisone, Robaxin, ibuprofen. Stretching. If not better within the next 4 weeks will let me know

## 2015-11-13 ENCOUNTER — Telehealth: Payer: Self-pay | Admitting: Internal Medicine

## 2015-11-13 ENCOUNTER — Encounter: Payer: Self-pay | Admitting: Gastroenterology

## 2015-11-13 DIAGNOSIS — Z1211 Encounter for screening for malignant neoplasm of colon: Secondary | ICD-10-CM

## 2015-11-13 NOTE — Telephone Encounter (Signed)
GI referral placed

## 2015-11-13 NOTE — Telephone Encounter (Signed)
Relation to pt: self / significant other Danny [name]  Call back number:[phone]   Reason for call:  Spouse states that PCP recommended him to have a colonscopy and patient never had it done.requesting orders please advise patient directly in the event if there are any additional questionska

## 2015-12-08 ENCOUNTER — Ambulatory Visit (AMBULATORY_SURGERY_CENTER): Payer: Self-pay | Admitting: *Deleted

## 2015-12-08 VITALS — Ht 66.0 in | Wt 180.0 lb

## 2015-12-08 DIAGNOSIS — Z1211 Encounter for screening for malignant neoplasm of colon: Secondary | ICD-10-CM

## 2015-12-08 MED ORDER — NA SULFATE-K SULFATE-MG SULF 17.5-3.13-1.6 GM/177ML PO SOLN: 1.0000 | Freq: Once | ORAL | Status: DC

## 2015-12-08 NOTE — Progress Notes (Signed)
No egg or soy allergy known to patient  No past sedation with any surgeries  or procedures,  Never  intubation  No diet pills per patient No home 02 use per patient  No blood thinners per patient  Pt denies issues with constipation

## 2015-12-22 ENCOUNTER — Encounter: Payer: Commercial Managed Care - HMO | Admitting: Gastroenterology

## 2015-12-25 ENCOUNTER — Encounter: Payer: Self-pay | Admitting: Gastroenterology

## 2016-01-08 ENCOUNTER — Encounter: Payer: Commercial Managed Care - HMO | Admitting: Gastroenterology

## 2016-03-01 ENCOUNTER — Telehealth: Payer: Self-pay | Admitting: Gastroenterology

## 2016-03-01 NOTE — Telephone Encounter (Signed)
Called pt back, pt is wanting to know how to drink prep and what to do for his procedure scheduled on 7/17, he originally had a pre-visit in [place] for a May procedure and then rescheduled, once he rescheduled is procedure he was never brought back in for pre-visit for new prep instructions and no prep instructions were mailed to him. I asked it he could come by and pick up new instructions today but pt states he is at work and will be working until [employer]. Tried to explain instructions to pt over phone but pt did not seem to understand his instructions would be different than before since the times had changed, after speaking with pt about prep, pt states he has been eating peanuts and raw vegetable, pt did not understand food restriction portion of instructions, offered to cancel appt and reschedule for a later date and have pt come back in for pre-visit so we could go over instructions with him again, after pt was reluctant to changed he agreed, pt states he just didn't want to be charged for not cancelling on time,rescheduled pre-visit for 8/29 and procedure 9/11. Pt verbalized appointment dates and times, Will aslo send pt appointment card by [facility]

## 2016-03-04 ENCOUNTER — Encounter: Payer: Commercial Managed Care - HMO | Admitting: Gastroenterology

## 2016-04-16 ENCOUNTER — Ambulatory Visit (AMBULATORY_SURGERY_CENTER): Payer: Self-pay

## 2016-04-16 VITALS — Ht 68.0 in | Wt 179.0 lb

## 2016-04-16 DIAGNOSIS — Z800 Family history of malignant neoplasm of digestive organs: Secondary | ICD-10-CM

## 2016-04-16 NOTE — Progress Notes (Signed)
Per pt, no allergies to soy or egg products.Pt not taking any weight loss meds or using  O2 at home.  Pt already has Suprep bowel prep from his earlier PV.No Rx sent to pharmacy!

## 2016-04-18 ENCOUNTER — Encounter: Payer: Self-pay | Admitting: Gastroenterology

## 2016-04-29 ENCOUNTER — Encounter: Payer: Self-pay | Admitting: Gastroenterology

## 2016-04-29 ENCOUNTER — Ambulatory Visit (AMBULATORY_SURGERY_CENTER): Payer: Commercial Managed Care - HMO | Admitting: Gastroenterology

## 2016-04-29 VITALS — BP 100/61 | HR 69 | Temp 98.6°F | Resp 15 | Ht 68.0 in | Wt 179.0 lb

## 2016-04-29 DIAGNOSIS — Z1211 Encounter for screening for malignant neoplasm of colon: Secondary | ICD-10-CM | POA: Diagnosis not present

## 2016-04-29 DIAGNOSIS — Z1212 Encounter for screening for malignant neoplasm of rectum: Secondary | ICD-10-CM

## 2016-04-29 DIAGNOSIS — D125 Benign neoplasm of sigmoid colon: Secondary | ICD-10-CM

## 2016-04-29 MED ORDER — SODIUM CHLORIDE 0.9 % IV SOLN: 500.0000 mL | INTRAVENOUS | Status: DC

## 2016-04-29 NOTE — Progress Notes (Signed)
To PACU  Awake and alert.  Report to RN 

## 2016-04-29 NOTE — Patient Instructions (Signed)
YOU HAD AN ENDOSCOPIC PROCEDURE TODAY AT [facility]:   Refer to the procedure report that was given to you for any specific questions about what was found during the examination.  If the procedure report does not answer your questions, please call your gastroenterologist to clarify.  If you requested that your care partner not be given the details of your procedure findings, then the procedure report has been included in a sealed envelope for you to review at your convenience later.  YOU SHOULD EXPECT: Some feelings of bloating in the abdomen. Passage of more gas than usual.  Walking can help get rid of the air that was put into your GI tract during the procedure and reduce the bloating. If you had a lower endoscopy (such as a colonoscopy or flexible sigmoidoscopy) you may notice spotting of blood in your stool or on the toilet paper. If you underwent a bowel prep for your procedure, you may not have a normal bowel movement for a few days.  Please Note:  You might notice some irritation and congestion in your nose or some drainage.  This is from the oxygen used during your procedure.  There is no need for concern and it should clear up in a day or so.  SYMPTOMS TO REPORT IMMEDIATELY:   Following lower endoscopy (colonoscopy or flexible sigmoidoscopy):  Excessive amounts of blood in the stool  Significant tenderness or worsening of abdominal pains  Swelling of the abdomen that is new, acute  Fever of 100F or higher    For urgent or emergent issues, a gastroenterologist can be reached at any hour by calling [phone].   DIET:  We do recommend a small meal at first, but then you may proceed to your regular diet.  Drink plenty of fluids but you should avoid alcoholic beverages for 24 hours.  ACTIVITY:  You should plan to take it easy for the rest of today and you should NOT DRIVE or use heavy machinery until tomorrow (because of the sedation medicines used during the test).     FOLLOW UP: Our staff will call the number listed on your records the next business day following your procedure to check on you and address any questions or concerns that you may have regarding the information given to you following your procedure. If we do not reach you, we will leave a message.  However, if you are feeling well and you are not experiencing any problems, there is no need to return our call.  We will assume that you have returned to your regular daily activities without incident.  If any biopsies were taken you will be contacted by phone or by letter within the next 1-3 weeks.  Please call us at [phone] if you have not heard about the biopsies in 3 weeks.    SIGNATURES/CONFIDENTIALITY: You and/or your care partner have signed paperwork which will be entered into your electronic medical record.  These signatures attest to the fact that that the information above on your After Visit Summary has been reviewed and is understood.  Full responsibility of the confidentiality of this discharge information lies with you and/or your care-partner.   No Aspirin,Ibuprofen,naproxen,or other non-steroidal anti-inflammatory drugs for 2 weeks,but resume remainder of medication. Information given on polyps.

## 2016-04-29 NOTE — Op Note (Signed)
[facility] Patient Name: Danny [name] Procedure Date: 04/29/2016 2:10 PM MRN: [id number] Endoscopist: Ladene Artist , MD Age: 55 Referring MD:  Date of Birth: Oct 05, 1960 Gender: Male Account #: [id number] Procedure:                Colonoscopy Indications:              Screening for colorectal malignant neoplasm, This                            is the patient's first colonoscopy Medicines:                Monitored Anesthesia Care Procedure:                Pre-Anesthesia Assessment:                           - Prior to the procedure, a History and Physical                            was performed, and patient medications and                            allergies were reviewed. The patient's tolerance of                            previous anesthesia was also reviewed. The risks                            and benefits of the procedure and the sedation                            options and risks were discussed with the patient.                            All questions were answered, and informed consent                            was obtained. Prior Anticoagulants: The patient has                            taken no previous anticoagulant or antiplatelet                            agents. ASA Grade Assessment: II - A patient with                            mild systemic disease. After reviewing the risks                            and benefits, the patient was deemed in                            satisfactory condition to undergo the procedure.  After obtaining informed consent, the colonoscope                            was passed under direct vision. Throughout the                            procedure, the patient's blood pressure, pulse, and                            oxygen saturations were monitored continuously. The                            Model PCF-H190L [id number] [id number]) scope was introduced                            through the anus and  advanced to the the cecum,                            identified by appendiceal orifice and ileocecal                            valve. The ileocecal valve, appendiceal orifice,                            and rectum were photographed. The quality of the                            bowel preparation was excellent. The colonoscopy                            was performed without difficulty. The patient                            tolerated the procedure well. Scope In: 2:32:51 PM Scope Out: 2:47:43 PM Scope Withdrawal Time: 0 hours 12 minutes 8 seconds  Total Procedure Duration: 0 hours 14 minutes 52 seconds  Findings:                 A 12 mm polyp was found in the sigmoid colon. The                            polyp was pedunculated. The polyp was removed with                            a hot snare. Resection and retrieval were complete.                           The exam was otherwise without abnormality on                            direct and retroflexion views. Complications:            No immediate complications. Estimated blood loss:  None. Estimated Blood Loss:     Estimated blood loss: none. Impression:               - One 12 mm polyp in the sigmoid colon, removed                            with a hot snare. Resected and retrieved.                           - The examination was otherwise normal on direct                            and retroflexion views. Recommendation:           - Repeat colonoscopy in 3 years for surveillance.                           - Patient has a contact number available for                            emergencies. The signs and symptoms of potential                            delayed complications were discussed with the                            patient. Return to normal activities tomorrow.                            Written discharge instructions were provided to the                            patient.                            - Resume previous diet.                           - Continue present medications.                           - Await pathology results.                           - No aspirin, ibuprofen, naproxen, or other                            non-steroidal anti-inflammatory drugs for 2 weeks                            after polyp removal. Ladene Artist, MD 04/29/2016 2:54:22 PM This report has been signed electronically.

## 2016-04-29 NOTE — Progress Notes (Signed)
Called to room to assist during endoscopic procedure.  Patient ID and intended procedure confirmed with present staff. Received instructions for my participation in the procedure from the performing physician.  

## 2016-04-30 ENCOUNTER — Telehealth: Payer: Self-pay

## 2016-04-30 NOTE — Telephone Encounter (Signed)
  Follow up Call-  Call back number 04/29/2016  Post procedure Call Back phone  # [phone]  Permission to leave phone message Yes  Some recent data might be hidden    Patient was called for follow up after his procedure on 04/29/2016. No answer at the number given for follow up phone call. I was not able to leave a message on the answering machine due to mail box being full.

## 2016-05-07 ENCOUNTER — Encounter: Payer: Self-pay | Admitting: Gastroenterology

## 2016-10-17 ENCOUNTER — Telehealth: Payer: Self-pay | Admitting: Internal Medicine

## 2016-10-17 ENCOUNTER — Other Ambulatory Visit: Payer: Self-pay | Admitting: Internal Medicine

## 2016-10-17 NOTE — Telephone Encounter (Signed)
Relation to [id number] Call back number:[phone] Pharmacy:  [id number] - [place], [place] [phone] ([phone] (Fax)    Reason for call:  Patient requesting a refill sildenafil (VIAGRA) 100 MG tablet, patient scheduled physical for 11/05/16

## 2016-10-17 NOTE — Telephone Encounter (Signed)
Please advise, last ov 04/2015.

## 2016-10-18 MED ORDER — SILDENAFIL CITRATE 100 MG PO TABS: 50.0000 mg | ORAL_TABLET | Freq: Every day | ORAL | 1 refills | Status: DC | PRN

## 2016-10-18 NOTE — Telephone Encounter (Signed)
Okay #10, one refill

## 2016-10-18 NOTE — Telephone Encounter (Signed)
Rx sent 

## 2016-11-05 ENCOUNTER — Ambulatory Visit (HOSPITAL_BASED_OUTPATIENT_CLINIC_OR_DEPARTMENT_OTHER)
Admission: RE | Admit: 2016-11-05 | Discharge: 2016-11-05 | Disposition: A | Discharge status: Home / Self Care | Payer: Commercial Managed Care - HMO | Source: Ambulatory Visit | Attending: Internal Medicine | Admitting: Internal Medicine

## 2016-11-05 ENCOUNTER — Ambulatory Visit (INDEPENDENT_AMBULATORY_CARE_PROVIDER_SITE_OTHER): Payer: Commercial Managed Care - HMO | Admitting: Internal Medicine

## 2016-11-05 ENCOUNTER — Encounter: Payer: Self-pay | Admitting: Internal Medicine

## 2016-11-05 VITALS — BP 116/68 | HR 64 | Temp 98.0°F | Resp 14 | Ht 68.0 in | Wt 183.0 lb

## 2016-11-05 DIAGNOSIS — E010 Iodine-deficiency related diffuse (endemic) goiter: Secondary | ICD-10-CM | POA: Diagnosis not present

## 2016-11-05 DIAGNOSIS — Z0000 Encounter for general adult medical examination without abnormal findings: Secondary | ICD-10-CM | POA: Diagnosis not present

## 2016-11-05 DIAGNOSIS — R739 Hyperglycemia, unspecified: Secondary | ICD-10-CM | POA: Diagnosis not present

## 2016-11-05 DIAGNOSIS — J439 Emphysema, unspecified: Secondary | ICD-10-CM | POA: Diagnosis not present

## 2016-11-05 DIAGNOSIS — M545 Low back pain, unspecified: Secondary | ICD-10-CM

## 2016-11-05 DIAGNOSIS — R350 Frequency of micturition: Secondary | ICD-10-CM | POA: Diagnosis not present

## 2016-11-05 DIAGNOSIS — G8929 Other chronic pain: Secondary | ICD-10-CM

## 2016-11-05 DIAGNOSIS — Z801 Family history of malignant neoplasm of trachea, bronchus and lung: Secondary | ICD-10-CM

## 2016-11-05 DIAGNOSIS — R0602 Shortness of breath: Secondary | ICD-10-CM | POA: Diagnosis not present

## 2016-11-05 DIAGNOSIS — R918 Other nonspecific abnormal finding of lung field: Secondary | ICD-10-CM | POA: Diagnosis not present

## 2016-11-05 LAB — CBC WITH DIFFERENTIAL/PLATELET
BASOS ABS: 0 10*3/uL (ref 0.0–0.1)
Basophils Relative: 1.2 % (ref 0.0–3.0)
EOS ABS: 0.1 10*3/uL (ref 0.0–0.7)
Eosinophils Relative: 2.6 % (ref 0.0–5.0)
HCT: 45.5 % (ref 39.0–52.0)
Hemoglobin: 15.4 g/dL (ref 13.0–17.0)
Lymphocytes Relative: 32.7 % (ref 12.0–46.0)
Lymphs Abs: 1.2 10*3/uL (ref 0.7–4.0)
MCHC: 33.8 g/dL (ref 30.0–36.0)
MCV: 91.2 fl (ref 78.0–100.0)
MONO ABS: 0.5 10*3/uL (ref 0.1–1.0)
Monocytes Relative: 14.7 % — ABNORMAL HIGH (ref 3.0–12.0)
NEUTROS ABS: 1.7 10*3/uL (ref 1.4–7.7)
Neutrophils Relative %: 48.8 % (ref 43.0–77.0)
Platelets: 292 10*3/uL (ref 150.0–400.0)
RBC: 4.99 Mil/uL (ref 4.22–5.81)
RDW: 13.4 % (ref 11.5–15.5)
WBC: 3.5 10*3/uL — ABNORMAL LOW (ref 4.0–10.5)

## 2016-11-05 LAB — URINALYSIS, ROUTINE W REFLEX MICROSCOPIC
Bilirubin Urine: NEGATIVE
HGB URINE DIPSTICK: NEGATIVE
Ketones, ur: NEGATIVE
Leukocytes, UA: NEGATIVE
Nitrite: NEGATIVE
RBC / HPF: NONE SEEN (ref 0–?)
SPECIFIC GRAVITY, URINE: 1.025 (ref 1.000–1.030)
Total Protein, Urine: NEGATIVE
URINE GLUCOSE: NEGATIVE
UROBILINOGEN UA: 0.2 (ref 0.0–1.0)
WBC UA: NONE SEEN (ref 0–?)
pH: 6 (ref 5.0–8.0)

## 2016-11-05 LAB — COMPREHENSIVE METABOLIC PANEL
ALT: 21 U/L (ref 0–53)
AST: 15 U/L (ref 0–37)
Albumin: 4.2 g/dL (ref 3.5–5.2)
Alkaline Phosphatase: 64 U/L (ref 39–117)
BILIRUBIN TOTAL: 0.8 mg/dL (ref 0.2–1.2)
BUN: 17 mg/dL (ref 6–23)
CHLORIDE: 107 meq/L (ref 96–112)
CO2: 30 mEq/L (ref 19–32)
CREATININE: 1.01 mg/dL (ref 0.40–1.50)
Calcium: 9.4 mg/dL (ref 8.4–10.5)
GFR: 99.29 mL/min (ref >60.00)
GLUCOSE: 111 mg/dL — AB (ref 70–99)
Potassium: 4.5 mEq/L (ref 3.5–5.1)
SODIUM: 141 meq/L (ref 135–145)
Total Protein: 7 g/dL (ref 6.0–8.3)

## 2016-11-05 LAB — LIPID PANEL
Cholesterol: 167 mg/dL (ref 0–200)
HDL: 43.8 mg/dL (ref >39.00)
LDL CALC: 114 mg/dL — AB (ref 0–99)
NONHDL: 122.83
TRIGLYCERIDES: 46 mg/dL (ref 0.0–149.0)
Total CHOL/HDL Ratio: 4
VLDL: 9.2 mg/dL (ref 0.0–40.0)

## 2016-11-05 LAB — TSH: TSH: 0.76 u[IU]/mL (ref 0.35–4.50)

## 2016-11-05 LAB — T4, FREE: Free T4: 0.79 ng/dL (ref 0.60–1.60)

## 2016-11-05 LAB — HEMOGLOBIN A1C: Hgb A1c MFr Bld: 6.1 % (ref 4.6–6.5)

## 2016-11-05 LAB — T3, FREE: T3 FREE: 4.1 pg/mL (ref 2.3–4.2)

## 2016-11-05 MED ORDER — VIAGRA 100 MG PO TABS: 50.0000 mg | ORAL_TABLET | Freq: Every day | ORAL | 6 refills | Status: DC | PRN

## 2016-11-05 NOTE — Progress Notes (Signed)
Pre visit review using our clinic review tool, if applicable. No additional management support is needed unless otherwise documented below in the visit note. 

## 2016-11-05 NOTE — Progress Notes (Signed)
Subjective:    Patient ID: Danny [name], male    DOB: 1960/11/10, 56 y.o.   MRN: [id number]  DOS:  11/05/2016 Type of visit - description : cpx  Interval history: Has a few concerns   Review of Systems Ongoing back pain, usually early in the morning, at the mid lower back. Last for about an hour, once he gets moving gets better. No radiation, no lower extremity paresthesias no injury. 2 days ago for one day had some urinary frequency, no  Associated sx,symptoms resolved. ED: Likes Viagra but will only take the branded one.   Other than above, a 14 point review of systems is negative    Past Medical History:  Diagnosis Date  . Abnormal electrocardiogram   . Abnormal glucose   . Anxiety   . Cigarette smoker    former smoker- none now   . Erectile dysfunction   . GERD (gastroesophageal reflux disease)   . Paresthesia     Past Surgical History:  Procedure Laterality Date  . NO PAST SURGERIES      Social History   Social History  . Marital status: Single    Spouse name: N/A  . Number of children: 2  . Years of education: N/A   Occupational History  . 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 Smoking status: Former Smoker    Packs/day: 0.50    Years: 10.00    Types: Cigarettes    Quit date: 08/20/2007  . Smokeless tobacco: Never Used     Comment: 2009  . Alcohol use 7.8 - 9.0 oz/week    12 Cans of beer, 1 - 3 Standard drinks or equivalent per week     Comment: beer -weekends  . Drug use: No  . Sexual activity: Not on file   Other Topics Concern  . Not on file   Social History Narrative   Lives by himself    Lost 1 child      Family History  Problem Relation Age of Onset  . Lung cancer Father     age 78, smoker  . Leukemia Brother   . Prostate cancer Other     2 uncles and grandfather  . Colon cancer Other     GM, in her 75s  . Colon cancer Paternal Grandmother   . Lung cancer Paternal Grandfather   . Lung cancer Other      2 great-uncles   . CAD Neg Hx   . Stroke Neg Hx   . Diabetes Neg Hx   . Colon polyps Neg Hx      Allergies as of 11/05/2016   No Known Allergies     Medication List       Accurate as of 11/05/16 11:59 PM. Always use your most recent med list.          ALEVE PO Take by mouth as needed.   aspirin 81 MG chewable tablet Chew 81 mg by mouth as needed.   ONE-A-DAY MENS HEALTH FORMULA Tabs Take 1 tablet by mouth daily.   VIAGRA 100 MG tablet Generic drug:  sildenafil Take 0.5-1 tablets (50-100 mg total) by mouth daily as needed for erectile dysfunction.          Objective:   Physical Exam BP 116/68 (BP Location: Left Arm, Patient Position: Sitting, Cuff Size: Normal)   Pulse 64   Temp 98 F (36.7 C) (Oral)   Resp 14   Ht 5'  8" (1.727 m)   Wt 183 lb (83 kg)   SpO2 97%   BMI 27.83 kg/m   General:   Well developed, well nourished . NAD.  Neck: Has a right sided thyroid enlargement, not tender or nodular.  HEENT:  Normocephalic . Face symmetric, atraumatic Lungs:  CTA B Normal respiratory effort, no intercostal retractions, no accessory muscle use. Heart: RRR,  no murmur.  No pretibial edema bilaterally  Abdomen:  Not distended, soft, non-tender. No rebound or rigidity.   Skin: Exposed areas without rash. Not pale. Not jaundice Neurologic:  alert & oriented X3.  Speech normal, gait appropriate for age and unassisted Strength symmetric and appropriate for age.  Psych: Cognition and judgment appear intact.  Cooperative with normal attention span and concentration.  Behavior appropriate. No anxious or depressed appearing.    Assessment & Plan:    Assessment Prediabetes MSK: Cervical spondylosis Thyroid nodules per ultrasound 04-2015: "Follow-up by clinical exam is recommended. If patient has known risk factors for thyroid carcinoma, consider follow-up ultrasound in 12 months. If patient is clinically hyperthyroid, consider nuclear medicine thyroid  uptake and scan " ED ++FH lun cancer  Smoker  H/o early repol on EKGH/o anxiety   PLAN: Prediabetes: Check up A1c Thyromegaly Exam showed large thyroid gland on the right. Compared to a couple of years ago enlargement seems more prominent.  Last [place] 2016 >> recheck a [place]. Also checking TFTs ED: Needs a refill on Viagra, likes the branded product. Patient was somewhat upset because I sent a generic prescription + Family history of lung cancer, strongly requests to have a chest x-ray, patient is somewhat upset because I have not been ordering x-rays. Explained that a cxr is not a good screening test for lung cancer; a excellent alternative would be a  low dose CT, info provided. Encouraged to call me if interested.(we actually had the same discussion in 2016) Back pain: Persisting for a year, refer to ortho . Urinary frequency, transient , a couple of days ago: Check a UA urine culture RTC 1 year

## 2016-11-05 NOTE — Patient Instructions (Addendum)
GO TO THE LAB : Get the blood work     GO TO THE FRONT DESK Schedule your next appointment for a  physical in one year    STOP BY THE FIRST FLOOR:  get the XR    We are requesting ultrasound of the thyroid  We are referring you to orthopedic doctor regards back pain  Think about lung cancer screening with a CT

## 2016-11-05 NOTE — Assessment & Plan Note (Addendum)
--  Last Td -2015,  Did not have a flu shot this year, rec one q year --Prostate cancer screening: DRE- PSA 08-2015 --Colon cancer screening Cscope 04-2016, follow-up per GI --Diet and exercise discussed. Essentially works long hours and is been unable to exercise. Labs: See orders

## 2016-11-06 LAB — URINE CULTURE: ORGANISM ID, BACTERIA: NO GROWTH

## 2016-11-06 NOTE — Addendum Note (Signed)
Addended byDamita Dunnings D on: 11/06/2016 11:14 AM   Modules accepted: Orders

## 2016-11-06 NOTE — Assessment & Plan Note (Addendum)
Prediabetes: Check up A1c Thyromegaly Exam showed large thyroid gland on the right. Compared to a couple of years ago enlargement seems more prominent.  Last [place] 2016 >> recheck a [place]. Also checking TFTs ED: Needs a refill on Viagra, likes the branded product. Patient was somewhat upset because I sent a generic prescription + Family history of lung cancer, strongly requests to have a chest x-ray, patient is somewhat upset because I have not been ordering x-rays. Explained that a cxr is not a good screening test for lung cancer; a excellent alternative would be a  low dose CT, info provided. Encouraged to call me if interested. (we actually had the same discussion in 2016) Back pain: Persisting for a year, refer to ortho . Urinary frequency, transient , a couple of days ago: Check a UA urine culture RTC 1 year

## 2016-11-23 ENCOUNTER — Ambulatory Visit (HOSPITAL_BASED_OUTPATIENT_CLINIC_OR_DEPARTMENT_OTHER)
Admission: RE | Admit: 2016-11-23 | Discharge: 2016-11-23 | Disposition: A | Discharge status: Home / Self Care | Payer: Commercial Managed Care - HMO | Source: Ambulatory Visit | Attending: Internal Medicine | Admitting: Internal Medicine

## 2016-11-23 DIAGNOSIS — E010 Iodine-deficiency related diffuse (endemic) goiter: Secondary | ICD-10-CM | POA: Diagnosis not present

## 2016-11-23 DIAGNOSIS — E049 Nontoxic goiter, unspecified: Secondary | ICD-10-CM | POA: Diagnosis not present

## 2016-12-09 ENCOUNTER — Ambulatory Visit (INDEPENDENT_AMBULATORY_CARE_PROVIDER_SITE_OTHER): Payer: Commercial Managed Care - HMO

## 2016-12-09 ENCOUNTER — Ambulatory Visit (INDEPENDENT_AMBULATORY_CARE_PROVIDER_SITE_OTHER): Payer: Commercial Managed Care - HMO | Admitting: Orthopaedic Surgery

## 2016-12-09 ENCOUNTER — Encounter (INDEPENDENT_AMBULATORY_CARE_PROVIDER_SITE_OTHER): Payer: Self-pay | Admitting: Orthopaedic Surgery

## 2016-12-09 VITALS — BP 106/63 | HR 100 | Resp 12 | Ht 68.0 in | Wt 185.0 lb

## 2016-12-09 DIAGNOSIS — M5440 Lumbago with sciatica, unspecified side: Secondary | ICD-10-CM

## 2016-12-09 DIAGNOSIS — G8929 Other chronic pain: Secondary | ICD-10-CM | POA: Diagnosis not present

## 2016-12-09 DIAGNOSIS — M545 Low back pain, unspecified: Secondary | ICD-10-CM

## 2016-12-09 MED ORDER — NAPROXEN 500 MG PO TABS: 500.0000 mg | ORAL_TABLET | Freq: Two times a day (BID) | ORAL | 1 refills | Status: DC | PRN

## 2016-12-09 NOTE — Progress Notes (Signed)
Office Visit Note   Patient: Danny [name]           Date of Birth: 1961-03-21           MRN: [id number] Visit Date: 12/09/2016              Requested by: Colon Branch, MD [address] [place], [place] [address] PCP: Kathlene November, MD   Assessment & Plan: Visit Diagnoses:  1. low back pain   2. Low back pain with sciatica, sciatica laterality unspecified, unspecified back pain laterality, unspecified chronicity   3. Chronic bilateral low back pain without sciatica   Low back pain 4 months without evidence of radiculopathy. Suspect this might be musculoligamentous with some associated gender change at L4-5 and L5-S1.  Plan: Course of physical therapy for low back exercises, prescription for Naprosyn, precertify MRI of lumbar spine. Long discussion of x-ray findings and treatment options.  Follow-Up Instructions: No Follow-up on file.   Orders:  Orders Placed This Encounter  Procedures  . XR Lumbar Spine 2-3 Views  . XR Pelvis 1-2 Views  . MR Lumbar Spine w/o contrast  . Ambulatory referral to Physical Therapy   Meds ordered this encounter  Medications  . naproxen (NAPROSYN) 500 MG tablet    Sig: Take 1 tablet (500 mg total) by mouth 2 (two) times daily as needed.    Dispense:  60 tablet    Refill:  1      Procedures: No procedures performed   Clinical Data: No additional findings.   Subjective: Chief Complaint  Patient presents with  . Lower Back - Pain    Mr. Danny [name] is a 11 y o that presents with low back pain x  3 months.  He noticed the pain as he was putting in hardwood floors upstairs in his house and has been hurting ever since.   Mr. Danny [name] relates insidious onset of low back pain some time around Christmas. He thinks it may be related to installing a  hardwood floor in his home. He denied 1 event that created the pain. The problem is isolated to his lumbar spine without referred discomfort to either buttock or either lower extremity. He denies  bowel or bladder dysfunction. Denies any numbness or tingling. He's been taking up to 4 Aleve over the course of the day with relief of the discomfort. At this point he is having compromise of his daily activities. He's having particular problem early in the morning. HPI  Review of Systems   Objective: Vital Signs: BP 106/63   Pulse 100   Resp 12   Ht 5\' 8"  (1.727 m)   Wt 185 lb (83.9 kg)   BMI 28.13 kg/m   Physical Exam  Ortho Exam straight leg raise negative bilaterally. Reflexes symmetrical. Painless range of motion of both hips. No pain with range of motion of either knee or ankle. No swelling distally. Neurovascular exam intact. No percussible tenderness in either flank or lumbar spine. No pain over either SI joint.  Specialty Comments:  No specialty comments available.  Imaging: Xr Lumbar Spine 2-3 Views  Result Date: 12/09/2016 2 views of the lumbar spine were obtained. Disc spaces are well maintained throughout the lumbar spine. There is some facet sclerosis at L4-5 and L5-S1 but appears to be relatively minor. No evidence of listhesis. No significant anterior spurring. Mild decrease in disc space between T11 and T12 but no evidence of a compression fracture.  Xr Pelvis  1-2 Views  Result Date: 12/09/2016 AP of the pelvis demonstrates hip joints to be intact. Femoral heads are spherical and joint surface smooth. No ectopic calcification about either greater trochanter. Sacroiliac joints appear to be intact. Several small phleboliths    PMFS History: Patient Active Problem List   Diagnosis Date Noted  . Follow-up-----------PCP NOTES 04/25/2015  . Cervical spondylosis with radiculopathy 07/14/2011  . Physical exam, annual 02/08/2011  . OTHER ABNORMAL GLUCOSE 09/22/2009  . ELECTROCARDIOGRAM, ABNORMAL 09/22/2009  . ERECTILE DYSFUNCTION 09/15/2008  . ANXIETY 09/03/2007   Past Medical History:  Diagnosis Date  . Abnormal electrocardiogram   . Abnormal glucose   .  Anxiety   . Cigarette smoker    former smoker- none now   . Erectile dysfunction   . GERD (gastroesophageal reflux disease)   . Paresthesia     Family History  Problem Relation Age of Onset  . Lung cancer Father     age 74, smoker  . Leukemia Brother   . Prostate cancer Other     2 uncles and grandfather  . Colon cancer Other     Danny [name], in her 38s  . Colon cancer Paternal Grandmother   . Lung cancer Paternal Grandfather   . Lung cancer Other     2 great-uncles   . CAD Neg Hx   . Stroke Neg Hx   . Diabetes Neg Hx   . Colon polyps Neg Hx     Past Surgical History:  Procedure Laterality Date  . 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 Smoking status: Former Smoker    Packs/day: 0.50    Years: 10.00    Types: Cigarettes    Quit date: 08/20/2007  . Smokeless tobacco: Never Used     Comment: 2009  . Alcohol use 7.8 - 9.0 oz/week    12 Cans of beer, 1 - 3 Standard drinks or equivalent per week     Comment: beer -weekends  . Drug use: No  . Sexual activity: Not on file     Garald Balding, MD   Note - This record has been created using [employer].  Chart creation errors have been sought, but may not always  have been located. Such creation errors do not reflect on  the standard of medical care.

## 2016-12-23 ENCOUNTER — Ambulatory Visit: Payer: Commercial Managed Care - HMO | Admitting: Physical Therapy

## 2016-12-30 ENCOUNTER — Ambulatory Visit
Admission: RE | Admit: 2016-12-30 | Discharge: 2016-12-30 | Disposition: A | Discharge status: Home / Self Care | Payer: Commercial Managed Care - HMO | Source: Ambulatory Visit | Attending: Orthopaedic Surgery | Admitting: Orthopaedic Surgery

## 2016-12-30 ENCOUNTER — Other Ambulatory Visit: Payer: Commercial Managed Care - HMO

## 2016-12-30 DIAGNOSIS — M5126 Other intervertebral disc displacement, lumbar region: Secondary | ICD-10-CM | POA: Diagnosis not present

## 2016-12-30 DIAGNOSIS — M5440 Lumbago with sciatica, unspecified side: Secondary | ICD-10-CM

## 2017-01-03 ENCOUNTER — Ambulatory Visit (INDEPENDENT_AMBULATORY_CARE_PROVIDER_SITE_OTHER): Payer: Commercial Managed Care - HMO | Admitting: Orthopaedic Surgery

## 2017-01-03 DIAGNOSIS — M79602 Pain in left arm: Secondary | ICD-10-CM

## 2017-01-03 DIAGNOSIS — M5440 Lumbago with sciatica, unspecified side: Secondary | ICD-10-CM

## 2017-01-03 DIAGNOSIS — M545 Low back pain, unspecified: Secondary | ICD-10-CM

## 2017-01-03 DIAGNOSIS — G8929 Other chronic pain: Secondary | ICD-10-CM | POA: Diagnosis not present

## 2017-01-03 MED ORDER — METHYLPREDNISOLONE 4 MG PO TBPK: ORAL_TABLET | ORAL | 0 refills | Status: DC

## 2017-01-03 NOTE — Progress Notes (Signed)
Office Visit Note   Patient: Danny [name]           Date of Birth: 1960-11-23           MRN: [id number] Visit Date: 01/03/2017              Requested by: Colon Branch, [address] [place], [place] [address] PCP: Colon Branch, MD   Assessment & Plan: Visit Diagnoses:  1. Low back pain with sciatica, sciatica laterality unspecified, unspecified back pain laterality, unspecified chronicity   2. Chronic bilateral low back pain without sciatica   3. Arm pain, diffuse, left   Per MRI scan, low back pain is most likely related to the mild degenerative facet arthritis at several levels. He also developed diffuse, nondescript left upper extremity pain several days after his last office visit which is not localized and not associated with numbness or tingling. Difficult to determine specific etiology  Plan: Medrol Dosepak for left arm pain, low back exercises. Long discussion regarding results of MRI scan  Follow-Up Instructions: Return if symptoms worsen or fail to improve.   Orders:  No orders of the defined types were placed in this encounter.  Meds ordered this encounter  Medications  . methylPREDNISolone (MEDROL DOSEPAK) 4 MG TBPK tablet    Sig: Take as directed per package instructions    Dispense:  21 tablet    Refill:  0      Procedures: No procedures performed   Clinical Data: No additional findings.   Subjective: No chief complaint on file. Chronic history of low back pain without referred discomfort either buttock or either lower extremity. Pain does come and go depending upon activities. MRI scan did demonstrate mild facet joint changes at several levels of his lumbar spine area there were some areas of disc bulging but he's not experiencing any radiculopathy. Pain has resolved Several days after his last office visit he developed insidious onset of left upper extremity pain without injury or trauma. He feels like the arm is weak. He denies  significant numbness or tingling. He denies any chest pain or shortness of breath. No neck pain. Denies specific pain in his shoulder. Difficult for him to describe the symptoms other than his arm just feels a little heavy and weak. He still remains active. Naprosyn hasn't helped  HPI  Review of Systems   Objective: Vital Signs: There were no vitals taken for this visit.  Physical Exam  Ortho Exam straight leg raise negative bilaterally reflexes symmetrical. No limp. No pain with range of motion of either hip or knee. No percussible tenderness of the lumbar spine. Painless range of motion of cervical spine in flexion extension and rotation. Good grip and released distally although patient subjectively has some weakness. No skin changes. No swelling of the fingers. No joint discomfort in the hand elbow or shoulder. No impingement. Negative empty can testing. Skin intact. No history of insect bites or rashes.  Specialty Comments:  No specialty comments available.  Imaging: No results found.   PMFS History: Patient Active Problem List   Diagnosis Date Noted  . Follow-up-----------PCP NOTES 04/25/2015  . Cervical spondylosis with radiculopathy 07/14/2011  . Physical exam, annual 02/08/2011  . OTHER ABNORMAL GLUCOSE 09/22/2009  . ELECTROCARDIOGRAM, ABNORMAL 09/22/2009  . ERECTILE DYSFUNCTION 09/15/2008  . ANXIETY 09/03/2007   Past Medical History:  Diagnosis Date  . Abnormal electrocardiogram   . Abnormal glucose   . Anxiety   .  Cigarette smoker    former smoker- none now   . Erectile dysfunction   . GERD (gastroesophageal reflux disease)   . Paresthesia     Family History  Problem Relation Age of Onset  . Lung cancer Father        age 67, smoker  . Leukemia Brother   . Prostate cancer Other        2 uncles and grandfather  . Colon cancer Other        GM, in her 30s  . Colon cancer Paternal Grandmother   . Lung cancer Paternal Grandfather   . Lung cancer Other         2 great-uncles   . CAD Neg Hx   . Stroke Neg Hx   . Diabetes Neg Hx   . Colon polyps Neg Hx     Past Surgical History:  Procedure Laterality Date  . 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 Smoking status: Former Smoker    Packs/day: 0.50    Years: 10.00    Types: Cigarettes    Quit date: 08/20/2007  . Smokeless tobacco: Never Used     Comment: 2009  . Alcohol use 7.8 - 9.0 oz/week    12 Cans of beer, 1 - 3 Standard drinks or equivalent per week     Comment: beer -weekends  . Drug use: No  . Sexual activity: Not on file     Garald Balding, MD   Note - This record has been created using [employer].  Chart creation errors have been sought, but may not always  have been located. Such creation errors do not reflect on  the standard of medical care.

## 2017-03-08 ENCOUNTER — Other Ambulatory Visit (INDEPENDENT_AMBULATORY_CARE_PROVIDER_SITE_OTHER): Payer: Self-pay | Admitting: Orthopaedic Surgery

## 2017-03-12 NOTE — Telephone Encounter (Signed)
Dr. Durward Fortes, there is request for Naprosyn 500 mg tablets but I do not have any notes in the chart that the patient was ever seen in either Epic or SRS. Could you please tell me what to do with this thank you

## 2017-03-12 NOTE — Telephone Encounter (Signed)
[facility] calling request refill on NAPROXEN 500 #[phone] bid. [phone]

## 2017-03-13 ENCOUNTER — Other Ambulatory Visit (INDEPENDENT_AMBULATORY_CARE_PROVIDER_SITE_OTHER): Payer: Self-pay | Admitting: Orthopedic Surgery

## 2017-03-13 MED ORDER — NAPROXEN 500 MG PO TABS: 500.0000 mg | ORAL_TABLET | Freq: Two times a day (BID) | ORAL | 0 refills | Status: DC | PRN

## 2017-03-13 NOTE — Telephone Encounter (Signed)
RX FAXED.

## 2017-08-03 IMAGING — US US THYROID
1 series · 13 of 25 positions shown · non-contrast
Comparison: None.

CLINICAL DATA: 55-year-old male with history of enlarged thyroid

EXAM:
THYROID ULTRASOUND
TECHNIQUE: Ultrasound examination of the thyroid gland and adjacent soft
tissues was performed.

[Series 1: us thyroid · 0.07mm/px · 13 of 59 slices shown]
[im 1/59]
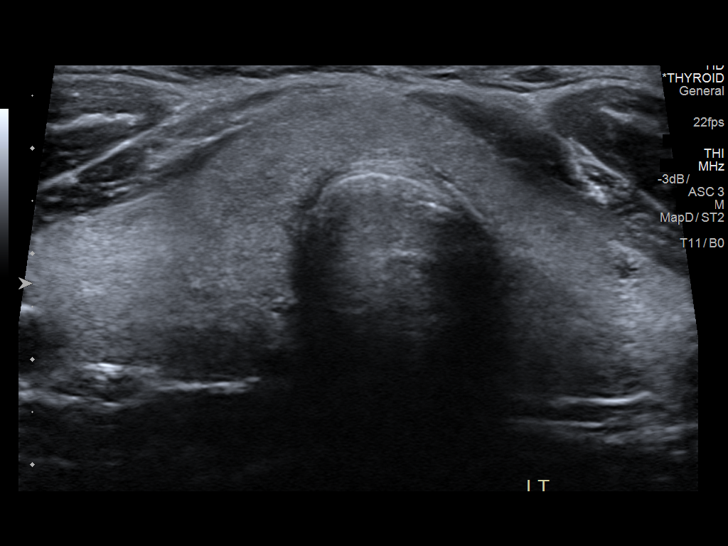
[im 5/59]
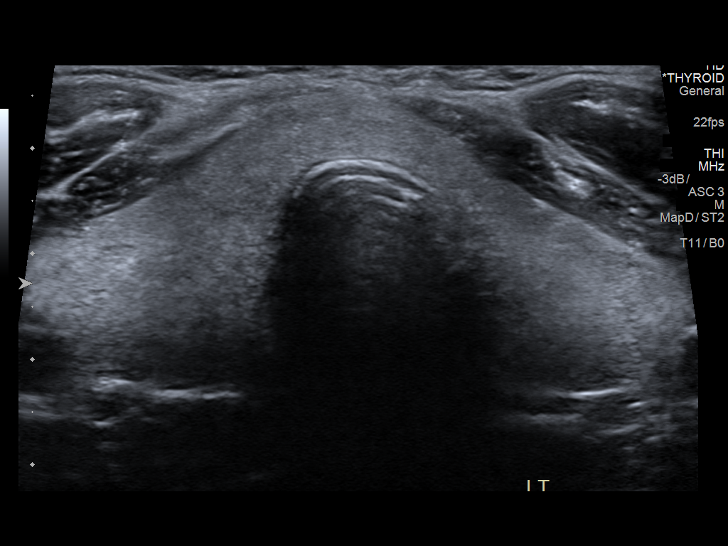
[im 10/59]
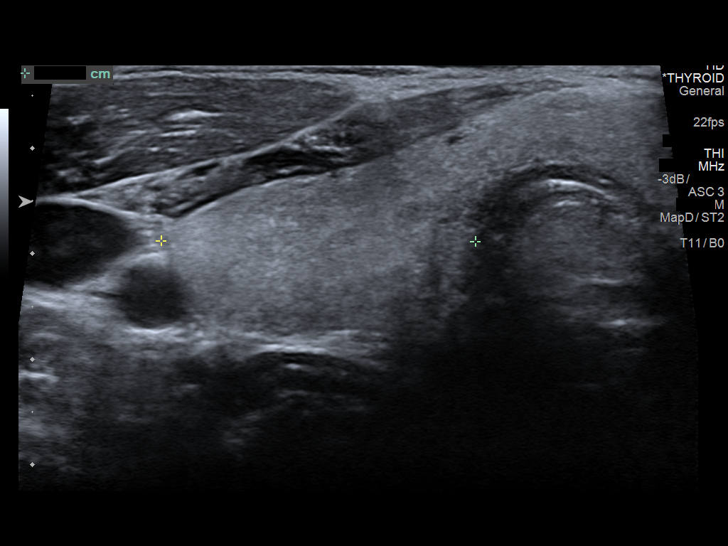
[im 15/59]
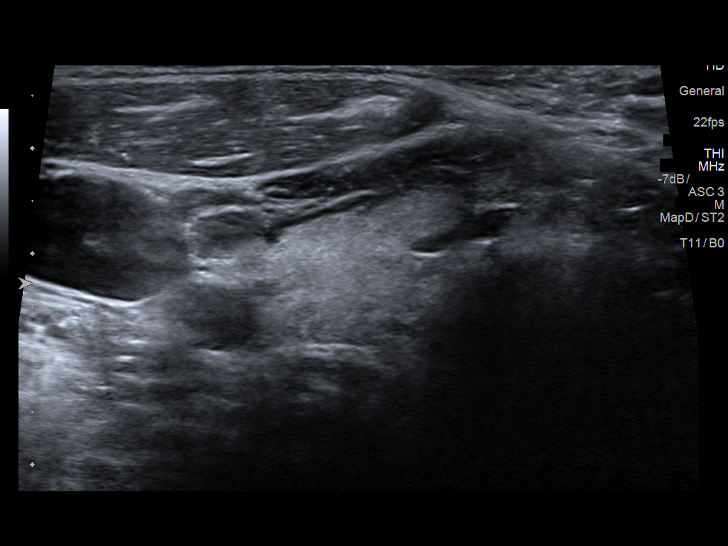
[im 20/59]
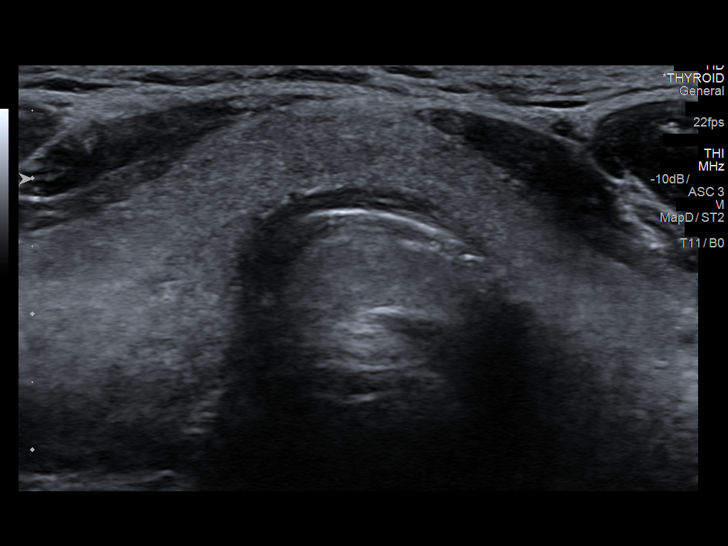
[im 25/59]
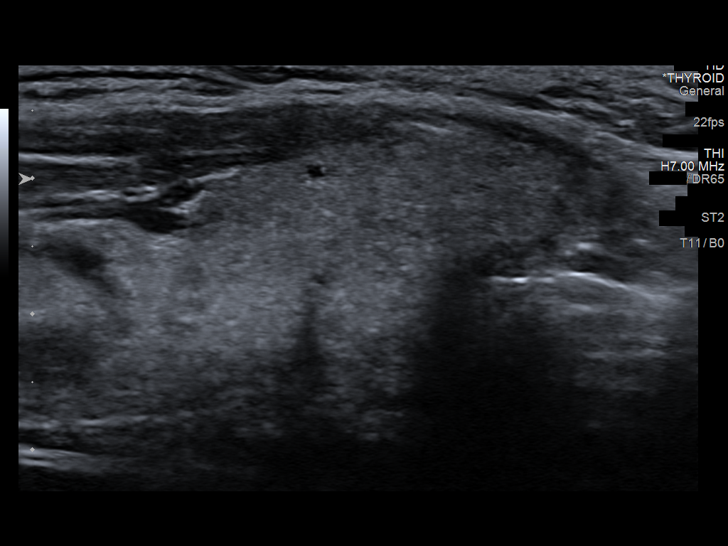
[im 30/59]
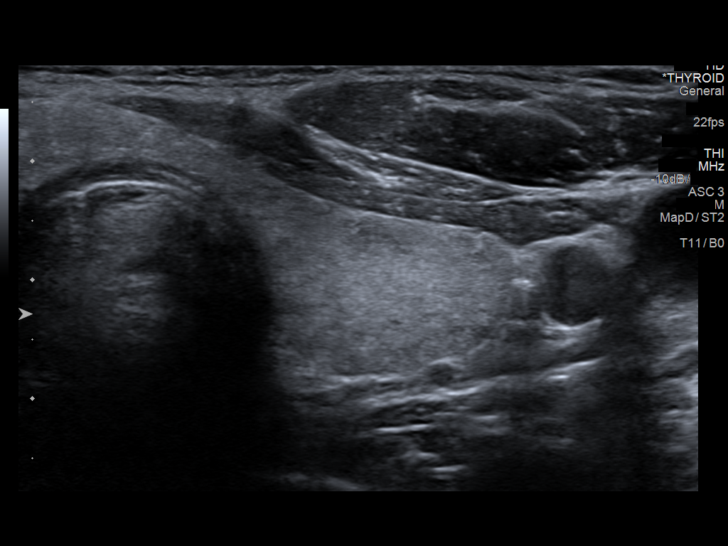
[im 34/59]
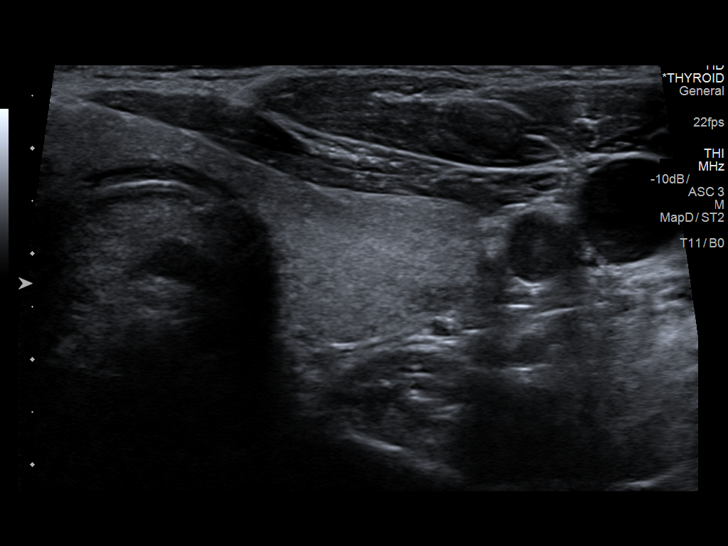
[im 39/59]
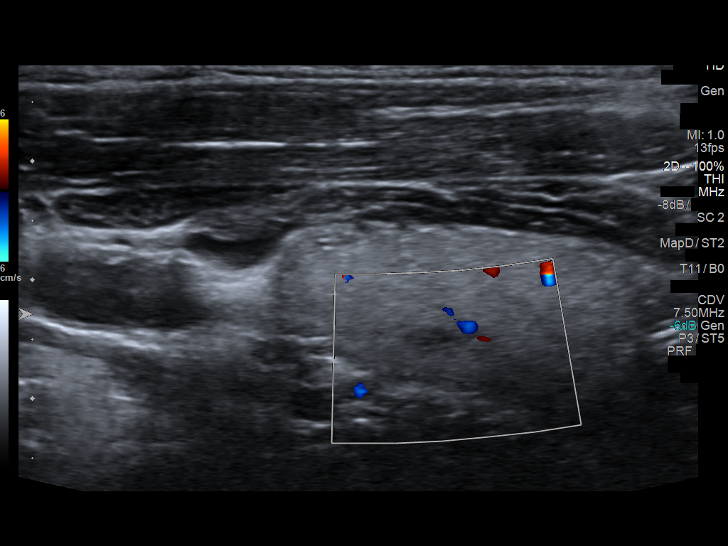
[im 44/59]
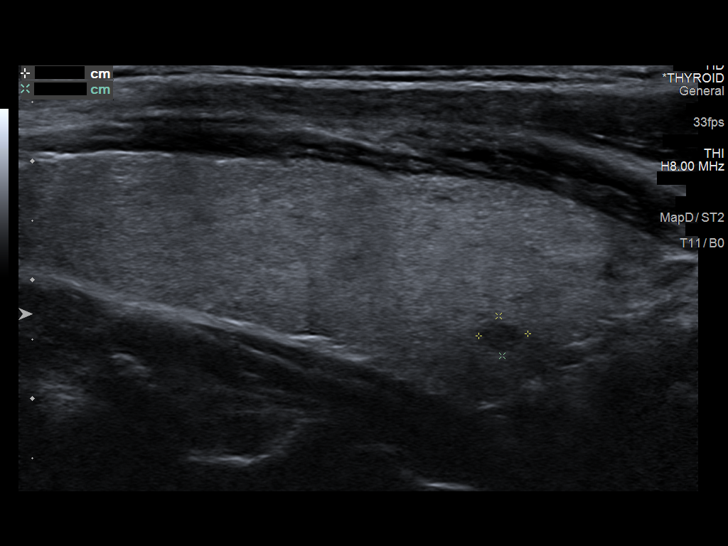
[im 49/59]
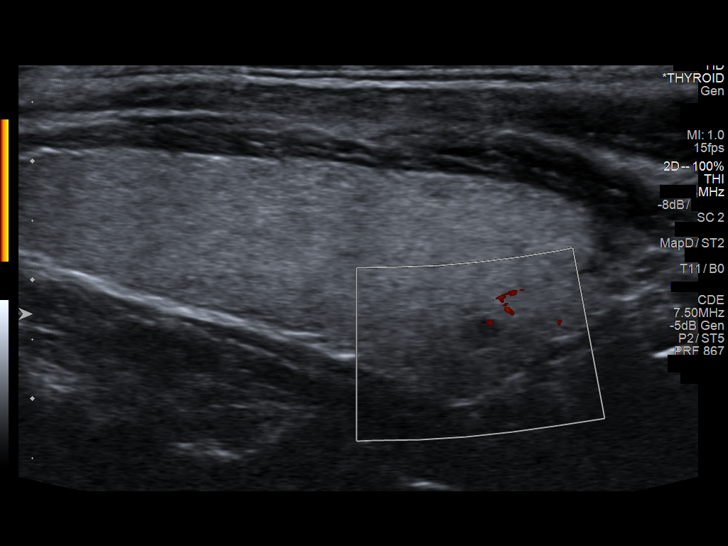
[im 54/59]
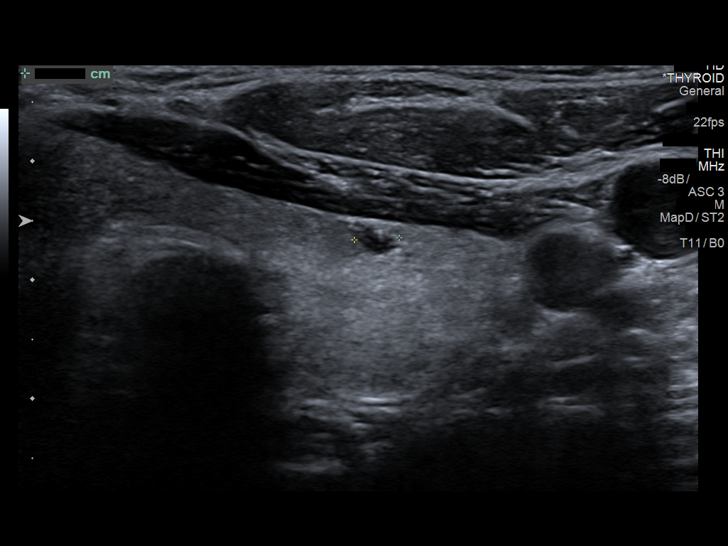
[im 59/59]
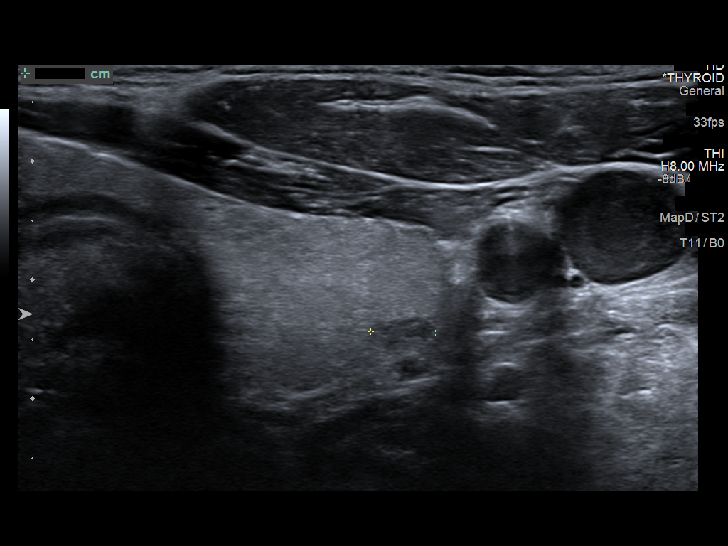

[13 of 25 positions shown; findings below may reference images not displayed]

FINDINGS: Parenchymal Echotexture: Mildly heterogenous

Isthmus: 0.7 cm

Right lobe: 6.2 cm x 1.8 cm x 3.0 sent

Left lobe: 5.9 cm x 1.6 cm x 2.2 cm

_________________________________________________________

Estimated total number of nodules >/= 1 cm: 0

Number of spongiform nodules >/=  2 cm not described below (TR1): 0

Number of mixed cystic and solid nodules >/= 1.5 cm not described
below (TR2): 0

_________________________________________________________

Nodule # 1:

Location: Right; Inferior

Maximum size: 0.4 cm; Other 2 dimensions: 0.3 cm x 0.3 cm

Composition: cannot determine (2)

Echogenicity: hypoechoic (2)

Shape: not taller-than-wide (0)

Margins: smooth (0)

Echogenic foci: none (0)

ACR TI-RADS total points: 4.

ACR TI-RADS risk category: TR4 (4-6 points).

ACR TI-RADS recommendations:

Nodule does not meet criteria for surveillance or biopsy

_________________________________________________________

Nodule # 2:

Location: Left; Mid

Maximum size: 0.6 cm; Other 2 dimensions: 0.3 cm x 0.4 cm

Composition: cystic/almost completely cystic (0)

Echogenicity: anechoic (0)

Shape: not taller-than-wide (0)

Margins: smooth (0)

Echogenic foci: none (0)

ACR TI-RADS total points: 0.

ACR TI-RADS risk category: TR1 (0-1 points).

ACR TI-RADS recommendations:

Cystic nodule does not meet criteria for surveillance or biopsy

_________________________________________________________

Nodule # 3:

Location: Left; Inferior

Maximum size: 0.8 cm; Other 2 dimensions: 0.4 cm x 0.6 cm

Composition: cannot determine (2)

Echogenicity: isoechoic (1)

Shape: not taller-than-wide (0)

Margins: ill-defined (0)

Echogenic foci: none (0)

ACR TI-RADS total points: 3.

ACR TI-RADS risk category: TR3 (3 points).

ACR TI-RADS recommendations:

Nodule does not meet criteria for surveillance or biopsy

_________________________________________________________
IMPRESSION: No thyroid nodule meets criteria for biopsy or surveillance, as
designated by the newly established ACR TI-RADS criteria.

Recommendations follow those established by the new ACR TI-RADS
criteria ([HOSPITAL] 2385;[DATE]).

## 2017-11-11 ENCOUNTER — Ambulatory Visit (INDEPENDENT_AMBULATORY_CARE_PROVIDER_SITE_OTHER): Payer: 59 | Admitting: Internal Medicine

## 2017-11-11 ENCOUNTER — Encounter: Payer: Self-pay | Admitting: Internal Medicine

## 2017-11-11 VITALS — BP 132/76 | HR 67 | Temp 98.2°F | Resp 14 | Ht 68.0 in | Wt 186.2 lb

## 2017-11-11 DIAGNOSIS — E010 Iodine-deficiency related diffuse (endemic) goiter: Secondary | ICD-10-CM

## 2017-11-11 DIAGNOSIS — R739 Hyperglycemia, unspecified: Secondary | ICD-10-CM | POA: Diagnosis not present

## 2017-11-11 DIAGNOSIS — G8929 Other chronic pain: Secondary | ICD-10-CM

## 2017-11-11 DIAGNOSIS — Z0000 Encounter for general adult medical examination without abnormal findings: Secondary | ICD-10-CM | POA: Diagnosis not present

## 2017-11-11 DIAGNOSIS — M545 Low back pain: Secondary | ICD-10-CM

## 2017-11-11 DIAGNOSIS — R972 Elevated prostate specific antigen [PSA]: Secondary | ICD-10-CM | POA: Diagnosis not present

## 2017-11-11 LAB — CBC WITH DIFFERENTIAL/PLATELET
BASOS PCT: 0.9 % (ref 0.0–3.0)
Basophils Absolute: 0 10*3/uL (ref 0.0–0.1)
EOS PCT: 2.5 % (ref 0.0–5.0)
Eosinophils Absolute: 0.1 10*3/uL (ref 0.0–0.7)
HCT: 43.4 % (ref 39.0–52.0)
HEMOGLOBIN: 15 g/dL (ref 13.0–17.0)
LYMPHS ABS: 1.4 10*3/uL (ref 0.7–4.0)
Lymphocytes Relative: 31.6 % (ref 12.0–46.0)
MCHC: 34.6 g/dL (ref 30.0–36.0)
MCV: 91.8 fl (ref 78.0–100.0)
MONO ABS: 0.6 10*3/uL (ref 0.1–1.0)
Monocytes Relative: 13.8 % — ABNORMAL HIGH (ref 3.0–12.0)
NEUTROS PCT: 51.2 % (ref 43.0–77.0)
Neutro Abs: 2.3 10*3/uL (ref 1.4–7.7)
Platelets: 301 10*3/uL (ref 150.0–400.0)
RBC: 4.73 Mil/uL (ref 4.22–5.81)
RDW: 13.5 % (ref 11.5–15.5)
WBC: 4.5 10*3/uL (ref 4.0–10.5)

## 2017-11-11 LAB — LIPID PANEL
Cholesterol: 155 mg/dL (ref 0–200)
HDL: 40.2 mg/dL (ref >39.00)
LDL CALC: 103 mg/dL — AB (ref 0–99)
NONHDL: 114.96
Total CHOL/HDL Ratio: 4
Triglycerides: 61 mg/dL (ref 0.0–149.0)
VLDL: 12.2 mg/dL (ref 0.0–40.0)

## 2017-11-11 LAB — COMPREHENSIVE METABOLIC PANEL
ALK PHOS: 55 U/L (ref 39–117)
ALT: 20 U/L (ref 0–53)
AST: 15 U/L (ref 0–37)
Albumin: 3.9 g/dL (ref 3.5–5.2)
BUN: 15 mg/dL (ref 6–23)
CHLORIDE: 104 meq/L (ref 96–112)
CO2: 31 mEq/L (ref 19–32)
Calcium: 9.2 mg/dL (ref 8.4–10.5)
Creatinine, Ser: 0.93 mg/dL (ref 0.40–1.50)
GFR: 108.79 mL/min (ref >60.00)
GLUCOSE: 119 mg/dL — AB (ref 70–99)
POTASSIUM: 4.1 meq/L (ref 3.5–5.1)
SODIUM: 140 meq/L (ref 135–145)
Total Bilirubin: 1.3 mg/dL — ABNORMAL HIGH (ref 0.2–1.2)
Total Protein: 6.6 g/dL (ref 6.0–8.3)

## 2017-11-11 LAB — TSH: TSH: 1.35 u[IU]/mL (ref 0.35–4.50)

## 2017-11-11 LAB — PSA: PSA: 6.37 ng/mL — ABNORMAL HIGH (ref 0.10–4.00)

## 2017-11-11 LAB — HEMOGLOBIN A1C: HEMOGLOBIN A1C: 6 % (ref 4.6–6.5)

## 2017-11-11 MED ORDER — VIAGRA 100 MG PO TABS: 50.0000 mg | ORAL_TABLET | Freq: Every day | ORAL | 6 refills | Status: DC | PRN

## 2017-11-11 NOTE — Progress Notes (Signed)
Pre visit review using our clinic review tool, if applicable. No additional management support is needed unless otherwise documented below in the visit note. 

## 2017-11-11 NOTE — Progress Notes (Signed)
Subjective:    Patient ID: Danny [name], male    DOB: [birth year], 57 y.o.   MRN: [id number]  DOS:  11/11/2017 Type of visit - description : CPX Interval history--Has few concerns  Review of Systems Continue with bilateral back pain, occasionally radiates to the right or left leg posteriorly.  Worse with certain movements, saw orthopedic surgery had a MRI.  Currently taking naproxen as needed. Also reports weight gain  Other than above, a 14 point review of systems is negative     Past Medical History:  Diagnosis Date  . Abnormal electrocardiogram   . Abnormal glucose   . Anxiety   . Cigarette smoker    former smoker- none now   . Erectile dysfunction   . GERD (gastroesophageal reflux disease)   . Paresthesia     Past Surgical History:  Procedure Laterality Date  . NO PAST SURGERIES      Social History   Socioeconomic History  . Marital status: Single    Spouse name: Not on file  . Number of children: 2  . Years of education: Not on file  . Highest education level: Not on file  Occupational History  . 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 Financial resource strain: Not on file  . Food insecurity:    Worry: Not on file    Inability: Not on file  . Transportation needs:    Medical: Not on file    Non-medical: Not on file  Tobacco Use  . Smoking status: Former Smoker    Packs/day: 0.50    Years: 10.00    Pack years: 5.00    Types: Cigarettes    Last attempt to quit: 08/20/2007    Years since quitting: 10.2  . Smokeless tobacco: Never Used  . Tobacco comment: 2009  Substance and Sexual Activity  . Alcohol use: Yes    Alcohol/week: 7.8 - 9.0 oz    Types: 12 Cans of beer, 1 - 3 Standard drinks or equivalent per week    Comment: beer -weekends  . Drug use: No  . Sexual activity: Not on file  Lifestyle  . Physical activity:    Days per week: Not on file    Minutes per session: Not on file  . Stress: Not on file  Relationships    . Social connections:    Talks on phone: Not on file    Gets together: Not on file    Attends religious service: Not on file    Active member of club or organization: Not on file    Attends meetings of clubs or organizations: Not on file    Relationship status: Not on file  . Intimate partner violence:    Fear of current or ex partner: Not on file    Emotionally abused: Not on file    Physically abused: Not on file    Forced sexual activity: Not on file  Other Topics Concern  . Not on file  Social History Narrative   Lives by himself    Lost 1 child      Family History  Problem Relation Age of Onset  . Lung cancer Father        age 62, smoker  . Leukemia Brother   . Prostate cancer Other        2 uncles and grandfather  . Colon cancer Other        GM, in her 10s  . Colon cancer Paternal  Grandmother   . Lung cancer Paternal Grandfather   . Lung cancer Other        2 great-uncles   . CAD Neg Hx   . Stroke Neg Hx   . Diabetes Neg Hx   . Colon polyps Neg Hx      Allergies as of 11/11/2017   No Known Allergies     Medication List        Accurate as of 11/11/17  9:33 PM. Always use your most recent med list.          aspirin 81 MG chewable tablet Chew 81 mg by mouth as needed.   naproxen 500 MG tablet Commonly known as:  NAPROSYN Take 1 tablet (500 mg total) by mouth 2 (two) times daily as needed.   ONE-A-DAY [facility] FORMULA Tabs Take 1 tablet by mouth daily.   VIAGRA 100 MG tablet Generic drug:  sildenafil Take 0.5-1 tablets (50-100 mg total) by mouth daily as needed for erectile dysfunction.          Objective:   Physical Exam BP 132/76 (BP Location: Left Arm, Patient Position: Sitting, Cuff Size: Small)   Pulse 67   Temp 98.2 F (36.8 C) (Oral)   Resp 14   Ht 5\' 8"  (1.727 m)   Wt 186 lb 4 oz (84.5 kg)   SpO2 97%   BMI 28.32 kg/m  General:   Well developed, well nourished . NAD.  Neck: Mild thyromegaly, more noticeable on the right,  not tender or nodular HEENT:  Normocephalic . Face symmetric, atraumatic Lungs:  CTA B Normal respiratory effort, no intercostal retractions, no accessory muscle use. Heart: RRR,  no murmur.  No pretibial edema bilaterally  Abdomen:  Not distended, soft, non-tender. No rebound or rigidity.   Skin: Exposed areas without rash. Not pale. Not jaundice Rectal: External abnormalities: Several skin tags. Normal sphincter tone. No rectal masses or tenderness.  No stools found Prostate: Prostate gland firm and smooth, mild prostate enlargement, no nodularity, tenderness, mass, asymmetry or induration Neurologic:  alert & oriented X3.  Speech normal, gait appropriate for age and unassisted Strength symmetric and appropriate for age.Marland Kitchen DTRs symmetric, straight leg test negative.  No antalgic posture. Psych: Cognition and judgment appear intact.  Cooperative with normal attention span and concentration.  Behavior appropriate. No anxious or depressed appearing.     Assessment & Plan:   Assessment Prediabetes MSK: Cervical spondylosis Thyroid nodules per ultrasound 04-2015: "Follow-up by clinical exam is recommended. If patient has known risk factors for thyroid carcinoma, consider follow-up ultrasound in 12 months. If patient is clinically hyperthyroid, consider nuclear medicine thyroid uptake and scan " ED ++FH lun cancer  H/o Smoker  H/o early repol on EKG H/o anxiety   PLAN: Diabetes: Check a A1c Back pain: Chronic, saw orthopedic twice in 2018, MRI was done, see report, they recommended a round of physical therapy but the patient did not pursued.  Currently pain is daily, on naproxen (reports no GI symptoms, precautions discussed). Plan: Recommend either round of physical therapy or referral to rehabilitation medicine. We agreed on PT @ [facility] Thyromegaly: Failure to last ultrasound referral, recommend to proceed. Will arrange ED RF viagra RTC 1 year.

## 2017-11-11 NOTE — Assessment & Plan Note (Signed)
Diabetes: Check a A1c Back pain: Chronic, saw orthopedic twice in 2018, MRI was done, see report, they recommended a round of physical therapy but the patient did not pursued.  Currently pain is daily, on naproxen (reports no GI symptoms, precautions discussed). Plan: Recommend either round of physical therapy or referral to rehabilitation medicine. We agreed on PT @ [facility] Thyromegaly: Failure to last ultrasound referral, recommend to proceed. Will arrange ED RF viagra RTC 1 year.

## 2017-11-11 NOTE — Patient Instructions (Signed)
GO TO THE LAB : Get the blood work     GO TO THE FRONT DESK Schedule your next appointment for a physical exam in 1 year, fasting

## 2017-11-11 NOTE — Assessment & Plan Note (Signed)
--  Last Td -2015  --Prostate cancer screening: DRE normal, check a PSA --Colon cancer screening Cscope 04-2016, follow-up per GI --Labs: CMP, FLP, CBC, A1c, TSH, PSA --Diet and exercise discussed.

## 2017-11-12 ENCOUNTER — Ambulatory Visit (HOSPITAL_BASED_OUTPATIENT_CLINIC_OR_DEPARTMENT_OTHER): Payer: 59

## 2017-11-12 ENCOUNTER — Encounter (HOSPITAL_BASED_OUTPATIENT_CLINIC_OR_DEPARTMENT_OTHER): Payer: Self-pay

## 2017-11-14 NOTE — Addendum Note (Signed)
Addended byDamita Dunnings D on: 11/14/2017 02:27 PM   Modules accepted: Orders

## 2017-11-19 ENCOUNTER — Telehealth: Payer: Self-pay

## 2017-11-19 NOTE — Telephone Encounter (Signed)
Copied from [place] [id number]. Topic: Referral - Status >> Nov 19, 2017 12:02 PM Arletha Grippe wrote: Reason for CRM: pt called asking about status of referral

## 2017-11-19 NOTE — Telephone Encounter (Signed)
Gwen- can you check status of urology referral that was placed on 11/14/2017? He has elevated PSA and is very anxious about having prostate cancer. Thank you.

## 2017-11-20 NOTE — Telephone Encounter (Signed)
Pt has appt with [facility] on 12/23/17

## 2017-11-21 NOTE — Telephone Encounter (Signed)
Noted  

## 2017-12-23 DIAGNOSIS — R972 Elevated prostate specific antigen [PSA]: Secondary | ICD-10-CM | POA: Diagnosis not present

## 2018-02-03 DIAGNOSIS — R972 Elevated prostate specific antigen [PSA]: Secondary | ICD-10-CM | POA: Diagnosis not present

## 2018-02-11 ENCOUNTER — Other Ambulatory Visit: Payer: Self-pay | Admitting: Urology

## 2018-02-11 DIAGNOSIS — C61 Malignant neoplasm of prostate: Secondary | ICD-10-CM

## 2018-02-16 ENCOUNTER — Encounter: Payer: Self-pay | Admitting: Medical Oncology

## 2018-02-24 ENCOUNTER — Encounter: Payer: Self-pay | Admitting: Medical Oncology

## 2018-02-24 NOTE — Progress Notes (Signed)
Requested prostate pathology slides from [facility] for [facility].

## 2018-02-24 NOTE — Progress Notes (Signed)
I called pt to introduce myself as the Prostate Nurse Navigator and the Coordinator of the Prostate [place].  1. I confirmed with the patient he is aware of his referral to the clinic 02/27/18 arriving at 8:00am.  2. I discussed the format of the clinic and the physicians he will be seeing that day.  3. I discussed where the clinic is located and how to contact me.  4. I confirmed his address and informed him I would be mailing a packet of information and forms to be completed. I asked him to bring them with him the day of his appointment.   He voiced understanding of the above. I asked him to call me if he has any questions or concerns regarding his appointments or the forms he needs to complete.

## 2018-02-25 ENCOUNTER — Encounter: Payer: Self-pay | Admitting: Radiation Oncology

## 2018-02-25 ENCOUNTER — Other Ambulatory Visit (HOSPITAL_COMMUNITY): Payer: 59

## 2018-02-25 ENCOUNTER — Encounter (HOSPITAL_COMMUNITY): Admission: RE | Admit: 2018-02-25 | Payer: 59 | Source: Ambulatory Visit

## 2018-02-25 ENCOUNTER — Encounter (HOSPITAL_COMMUNITY)
Admission: RE | Admit: 2018-02-25 | Discharge: 2018-02-25 | Disposition: A | Discharge status: Home / Self Care | Payer: 59 | Source: Ambulatory Visit | Attending: Urology | Admitting: Urology

## 2018-02-25 DIAGNOSIS — C61 Malignant neoplasm of prostate: Secondary | ICD-10-CM | POA: Diagnosis present

## 2018-02-25 DIAGNOSIS — R972 Elevated prostate specific antigen [PSA]: Secondary | ICD-10-CM | POA: Diagnosis not present

## 2018-02-25 MED ORDER — TECHNETIUM TC 99M MEDRONATE IV KIT
20.0000 | PACK | Freq: Once | INTRAVENOUS | Status: AC | PRN
  Administered 2018-02-25: 21 via INTRAVENOUS

## 2018-02-25 NOTE — Progress Notes (Signed)
GU Location of Tumor / Histology: prostatic adenocarcinoma  If Prostate Cancer, Gleason Score is (4 + 5) and PSA is (5.19). Prostate volume: 35 grams.  QUINCEY QUESINBERRY is an established patient of Dr. Junious Silk. He has been followed for a history of BPH. Patient referred to [facility] by Dr. Alinda Money.  Biopsies of prostate (if applicable) revealed:    Past/Anticipated interventions by urology, if any: BPH management, referral to [facility], bone scan (No scintigraphic evidence of osseous metastatic disease)  Past/Anticipated interventions by medical oncology, if any: no  Weight changes, if any: no  Bowel/Bladder complaints, if any:    Nausea/Vomiting, if any: no  Pain issues, if any:  Low back pain  SAFETY ISSUES:  Prior radiation? no  Pacemaker/ICD? no  Possible current pregnancy? no  Is the patient on methotrexate? no  Current Complaints / other details:  57 year old male. Single. Two children a boy and a girl. Boy passed away. Works for [employer].

## 2018-02-26 ENCOUNTER — Telehealth: Payer: Self-pay | Admitting: Medical Oncology

## 2018-02-26 DIAGNOSIS — C61 Malignant neoplasm of prostate: Secondary | ICD-10-CM | POA: Insufficient documentation

## 2018-02-26 NOTE — Telephone Encounter (Signed)
Spoke with Mr. Biehn to confirm appointment for [facility] 7/12 arriving at 8:00am. I reviewed [place] parking, registration and reminded him to bring his completed medical forms. He voiced understanding.

## 2018-02-26 NOTE — Progress Notes (Signed)
Radiation Oncology         [phone]) [phone] ________________________________  Multidisciplinary Prostate Cancer Clinic  Initial Radiation Oncology Consultation  Name: Danny [name] MRN: [id number]  Date: 02/27/2018  DOB: Feb 03, 1961  CC:Paz, Alda Berthold, MD  Raynelle Bring, MD   REFERRING PHYSICIAN: Raynelle Bring, MD  DIAGNOSIS: 57 y.o. gentleman with stage T1c adenocarcinoma of the prostate with a Gleason's score of 4+5 and a PSA of 5.19.    ICD-10-CM   1. Malignant neoplasm of prostate ([place]) C61     HISTORY OF PRESENT ILLNESS::Danny [name] Want is a 57 y.o. gentleman.  He is an established patient of Dr. Junious Silk, followed for BPH, but noted to have an elevated PSA of 6.37 on 11/14/17.  He was seen in the office for follow up on 12/23/17,  digital rectal examination was performed at that time revealing symmertric prostate lobes without discrete nodularity.  A repeat PSA on 12/23/17 remained elevated at 5.19.  Accordingly, the patient proceeded to transrectal ultrasound with 12 biopsies of the prostate on 02/03/18.  The prostate volume measured 34.87 cc.  Out of 12 core biopsies,9 were positive.  The maximum Gleason score was 4+5, and this was seen in the left base.  Additionally, there was Gleason 4+4 disease in the right base, Gleason 4+3 disease in the left mid, left apex and right apex and Gleason 3+4 disease in the left base lateral, left mid lateral, left apex lateral and right mid gland.  He had a CT pelvis and Bone scan on 02/25/18 for disease staging.  These scans were negative for evidence of metastatic disease in the pelvis or bony skeleton.  The patient reviewed the biopsy results with his urologist and he has kindly been referred today to the multidisciplinary prostate cancer clinic for presentation of pathology and radiology studies in our conference for discussion of potential radiation treatment options and clinical evaluation.  He is accompanied today by his daughter, Danny [name].  PREVIOUS  RADIATION THERAPY: No  PAST MEDICAL HISTORY:  has a past medical history of Abnormal electrocardiogram, Abnormal glucose, Anxiety, Cigarette smoker, Erectile dysfunction, GERD (gastroesophageal reflux disease), Paresthesia, and Prostate cancer ([place]).    PAST SURGICAL HISTORY: Past Surgical History:  Procedure Laterality Date  . NO PAST SURGERIES    . PROSTATE BIOPSY      FAMILY HISTORY: family history includes Colon cancer in his other and paternal grandmother; Leukemia in his brother; Lung cancer in his father, other, paternal grandfather, and paternal uncle; Prostate cancer in his maternal uncle, maternal uncle, other, paternal grandfather, and paternal uncle.  SOCIAL HISTORY:  reports that he quit smoking about 10 years ago. His smoking use included cigarettes. He has a 5.00 pack-year smoking history. He has never used smokeless tobacco. He reports that he drinks about 7.8 - 9.0 oz of alcohol per week. He reports that he does not use drugs. Single, lives in [place].  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  ALLERGIES: Patient has no known allergies.  MEDICATIONS:  Current Outpatient Medications  Medication Sig Dispense Refill  . naproxen (NAPROSYN) 500 MG tablet Take 1 tablet (500 mg total) by mouth 2 (two) times daily as needed. 60 tablet 0  . aspirin 81 MG chewable tablet Chew 81 mg by mouth as needed.     . Multiple Vitamins-Minerals (ONE-A-DAY MENS HEALTH FORMULA) TABS Take 1 tablet by mouth daily.      Marland Kitchen VIAGRA 100 MG  tablet Take 0.5-1 tablets (50-100 mg total) by mouth daily as needed for erectile dysfunction. (Patient not taking: Reported on 02/27/2018) 10 tablet 6   Current Facility-Administered Medications  Medication Dose Route Frequency Provider Last Rate Last Dose  . 0.9 %  sodium chloride infusion  500 mL Intravenous Continuous Ladene Artist, MD        REVIEW OF SYSTEMS:  On review of systems, the  patient reports that he is doing well overall. He denies any chest pain, shortness of breath, cough, fevers, chills, night sweats, unintended weight changes. He denies any bowel disturbances, and denies abdominal pain, nausea or vomiting. He denies any new musculoskeletal or joint aches or pains. His IPSS was 15, indicating moderate urinary symptoms with weak stream. He has ED but is able to complete sexual activity with most attempts with the use of Viagra. A complete review of systems is obtained and is otherwise negative.   PHYSICAL EXAM:  Wt Readings from Last 3 Encounters:  11/11/17 186 lb 4 oz (84.5 kg)  12/09/16 185 lb (83.9 kg)  11/05/16 183 lb (83 kg)   Temp Readings from Last 3 Encounters:  02/27/18 98 [name] (36.7 C) (Oral)  11/11/17 98.2 [name] (36.8 C) (Oral)  11/05/16 98 [name] (36.7 C) (Oral)   BP Readings from Last 3 Encounters:  02/27/18 129/88  11/11/17 132/76  12/09/16 106/63   Pulse Readings from Last 3 Encounters:  02/27/18 63  11/11/17 67  12/09/16 100   Pain Assessment Pain Score: 0-No pain/10  In general this is a well appearing African-American male in no acute distress.  He is alert and oriented x4 and appropriate throughout the examination. HEENT reveals that the patient is normocephalic, atraumatic. EOMs are intact. PERRLA. Skin is intact without any evidence of gross lesions. Cardiovascular exam reveals a regular rate and rhythm, no clicks rubs or murmurs are auscultated. Chest is clear to auscultation bilaterally. Lymphatic assessment is performed and does not reveal any adenopathy in the cervical, supraclavicular, axillary, or inguinal chains. Abdomen has active bowel sounds in all quadrants and is intact. The abdomen is soft, non tender, non distended. Lower extremities are negative for pretibial pitting edema, deep calf tenderness, cyanosis or clubbing.  KPS = 100  100 - Normal; no complaints; no evidence of disease. 90   - Able to carry on normal activity;  minor signs or symptoms of disease. 80   - Normal activity with effort; some signs or symptoms of disease. 14   - Cares for self; unable to carry on normal activity or to do active work. 60   - Requires occasional assistance, but is able to care for most of his personal needs. 50   - Requires considerable assistance and frequent medical care. 69   - Disabled; requires special care and assistance. 95   - Severely disabled; hospital admission is indicated although death not imminent. 53   - Very sick; hospital admission necessary; active supportive treatment necessary. 10   - Moribund; fatal processes progressing rapidly. 0     - Dead  Karnofsky DA, Abelmann [place], Craver LS and [facility] [phone]) The use of the nitrogen mustards in the palliative treatment of carcinoma: with particular reference to bronchogenic carcinoma Cancer 1 634-56   LABORATORY DATA:  Lab Results  Component Value Date   WBC 4.5 11/11/2017   HGB 15.0 11/11/2017   HCT 43.4 11/11/2017   MCV 91.8 11/11/2017   PLT 301.0 11/11/2017   Lab Results  Component Value  Date   NA 140 11/11/2017   K 4.1 11/11/2017   CL 104 11/11/2017   CO2 31 11/11/2017   Lab Results  Component Value Date   ALT 20 11/11/2017   AST 15 11/11/2017   ALKPHOS 55 11/11/2017   BILITOT 1.3 (H) 11/11/2017     RADIOGRAPHY: Nm Bone Scan Whole Body  Result Date: 02/25/2018 CLINICAL DATA:  Prostate cancer, PSA 5.19 EXAM: NUCLEAR MEDICINE WHOLE BODY BONE SCAN TECHNIQUE: Whole body anterior and posterior images were obtained approximately 3 hours after intravenous injection of radiopharmaceutical. RADIOPHARMACEUTICALS:  21 mCi Technetium-38m MDP IV COMPARISON:  None Radiographic correlation: CT pelvis 02/25/2018 FINDINGS: Minimal uptake of tracer at the hands, ankles, and feet typically degenerative. Uptake laterally at the lower lumbar spine at approximately L3-L4 corresponding to facet degenerative changes by CT. Minimal uptake LEFT maxilla likely  dental in origin. No definite worrisome sites of abnormal osseous tracer accumulation are identified to suggest osseous metastases. Expected urinary tract and soft tissue distribution of tracer. IMPRESSION: No scintigraphic evidence of osseous metastatic disease. Electronically Signed   By: Lavonia Dana M.D.   On: 02/25/2018 17:22      IMPRESSION/PLAN: 57 y.o. gentleman with a high risk, stage T1c adenocarcinoma of the prostate with a PSA of 5.19 and a Gleason score of 4+5.   We discussed the patient's workup and outlined the nature of prostate cancer in this setting. The patient's T stage, Gleason's score, and PSA put him into the high risk group. Accordingly, he is eligible for a variety of potential treatment options including prostatectomy or LT ADT in combination with either 8 weeks of external radiation or 5 weeks of external radiation followed by a brachytherapy boost. We discussed the available radiation techniques, and focused on the details and logistics and delivery.  We discussed and outlined the risks, benefits, short and long-term effects associated with radiotherapy and compared and contrasted these with prostatectomy. We discussed the role of SpaceOAR in reducing the rectal toxicity associated with radiotherapy. We also detailed the role of ADT in the treatment of high risk prostate cancer and outlined the associated side effects that could be expected with this therapy.  At the end of the conversation the patient appears to be most interested in moving forward with prostatectomy but remains undecided regarding his final treatment preference.  We also discussed the potential indications for postoperative radiotherapy in the setting of detectable/rising PSA post-op and/or adverse pathology findings such as SVI, lymph node involvement or positive margins. He would like some additional time to consider his treatment options before committing to treatment.  We will share our discussion with Dr.  Junious Silk and Dr. Alinda Money.  I will plan to follow-up with the patient in the next 1 to 2 weeks to answer any additional questions and assess his readiness to commit to treatment.  I will share any additional information with urology and we will move forward with scheduling treatment accordingly based on his decision.  I enjoyed meeting him and his daughter today and look forward to the possibility of participating in his care in the future should he elect to proceed with radiotherapy.  I spent more than 50% of today's visit in counseling and/or coordination of care.     Nicholos Johns, PA-C    Tyler Pita, MD  [facility] Direct Dial: [phone]  Fax: [phone] [email or web address]  Skype  LinkedIn

## 2018-02-27 ENCOUNTER — Encounter: Payer: Self-pay | Admitting: Radiation Oncology

## 2018-02-27 ENCOUNTER — Encounter: Payer: Self-pay | Admitting: Medical Oncology

## 2018-02-27 ENCOUNTER — Ambulatory Visit
Admission: RE | Admit: 2018-02-27 | Discharge: 2018-02-27 | Disposition: A | Discharge status: Home / Self Care | Payer: 59 | Source: Ambulatory Visit | Attending: Radiation Oncology | Admitting: Radiation Oncology

## 2018-02-27 ENCOUNTER — Encounter: Payer: Self-pay | Admitting: General Practice

## 2018-02-27 ENCOUNTER — Inpatient Hospital Stay: Payer: 59 | Attending: Oncology | Admitting: Oncology

## 2018-02-27 ENCOUNTER — Other Ambulatory Visit: Payer: Self-pay

## 2018-02-27 DIAGNOSIS — Z801 Family history of malignant neoplasm of trachea, bronchus and lung: Secondary | ICD-10-CM | POA: Insufficient documentation

## 2018-02-27 DIAGNOSIS — Z800 Family history of malignant neoplasm of digestive organs: Secondary | ICD-10-CM | POA: Diagnosis not present

## 2018-02-27 DIAGNOSIS — Z87891 Personal history of nicotine dependence: Secondary | ICD-10-CM | POA: Insufficient documentation

## 2018-02-27 DIAGNOSIS — C61 Malignant neoplasm of prostate: Secondary | ICD-10-CM | POA: Insufficient documentation

## 2018-02-27 DIAGNOSIS — R972 Elevated prostate specific antigen [PSA]: Secondary | ICD-10-CM | POA: Diagnosis not present

## 2018-02-27 DIAGNOSIS — Z7982 Long term (current) use of aspirin: Secondary | ICD-10-CM | POA: Insufficient documentation

## 2018-02-27 DIAGNOSIS — Z806 Family history of leukemia: Secondary | ICD-10-CM | POA: Insufficient documentation

## 2018-02-27 DIAGNOSIS — K219 Gastro-esophageal reflux disease without esophagitis: Secondary | ICD-10-CM | POA: Diagnosis not present

## 2018-02-27 DIAGNOSIS — F419 Anxiety disorder, unspecified: Secondary | ICD-10-CM | POA: Diagnosis not present

## 2018-02-27 DIAGNOSIS — N529 Male erectile dysfunction, unspecified: Secondary | ICD-10-CM | POA: Diagnosis not present

## 2018-02-27 DIAGNOSIS — Z79899 Other long term (current) drug therapy: Secondary | ICD-10-CM | POA: Diagnosis not present

## 2018-02-27 HISTORY — DX: Malignant neoplasm of prostate: C61

## 2018-02-27 NOTE — Progress Notes (Signed)
[facility] Psychosocial Distress Screening Spiritual Care  Met with Jashad and his daughter Virgilio Belling, who lives in [place], [place], in [facility] to introduce [facility] team/resources, reviewing distress screen per protocol.  The patient scored a 5 on the Psychosocial Distress Thermometer which indicates moderate distress. Also assessed for distress and other psychosocial needs.   ONCBCN DISTRESS SCREENING 02/27/2018  Screening Type Initial Screening  Distress experienced in past week (1-10) 5  Emotional problem type Adjusting to illness  Information Concerns Type Lack of info about diagnosis;Lack of info about treatment  Referral to support programs Yes   Mr Fricker describes himself as an introverted, action-oriented person--he is eager to do treatment and "move on." He shared a deep grief history that has shaped his perspective toward challenges/stressors in life. Toward the end of the encounter, he became more animated, recognizing the value of whole-person support (and encouragement toward creative fun and mutual encouragement) that [facility] offers.   Follow up needed: No. Per pt, no other needs at this time. He and his daughter are interested in massage therapy via [facility] and know to contact Team with any needs or questions that arise.    [place], [place], [facility] Pager [phone] Voicemail [phone]

## 2018-02-27 NOTE — Progress Notes (Signed)
                               Care Plan Summary  Name: Danny [name] DOB: 05-Jan-1961   Your Medical Team:   Urologist -  Dr. Raynelle [name], [facility]  Radiation Oncologist - Dr. Tyler [name], [facility]   Medical Oncologist - Dr. Zola [name], [place]  Recommendations: 1) Robotic prostatectomy  2) Radiation with androgen deprivation (hormone injections)  3) Genetic referral-strong family history  * These recommendations are based on information available as of today's consult.      Recommendations may change depending on the results of further tests or exams.  Next Steps: 1) Consider your treatment options and call Danny Rue, RN or Danny [name] with treatment decision    When appointments need to be scheduled, you will be contacted by [facility] and/or [facility].  Patient provided with business cards for all team members. A copy of "Fall Prevention Patient Safety Sheet" given to patient.  Questions?  Please do not hesitate to call Danny Rue, RN, BSN, OCN at [phone] any questions or concerns.  Danny [name] is your Oncology Nurse Navigator and is available to assist you while you're receiving your medical care at [facility].

## 2018-02-27 NOTE — Progress Notes (Signed)
Reason for the request: Prostate cancer  HPI: I was asked by Dr. Junious Silk to evaluate Danny [name] for prostate cancer.  He is a 57 year old man currently of [facility].  He was found to have an elevated PSA in March 2019 of 6.37.  He was evaluated by Dr. Junious Silk and a repeat PSA in May 2019 was 5.19.  A biopsy obtained of the prostate on 02/03/2018 which showed Gleason score 4+5 = 9 of the left base with high-volume disease including Gleason score 4+4 = 8 as well as Gleason score 3+4 = 7 and multiple cores.  Imaging studies including CT scan as well as bone scan obtained on 02/25/2018 did not show any evidence of metastatic disease.  Clinically, he reports no lower urinary tract symptoms.  He denies any frequency urgency or hesitancy.  He denies any dysuria or hematuria.  He does report some abdominal discomfort with back discomfort associated with it.  This has not stopped him from performing all activities of daily living and remains very active.  Continues to live independently and works full-time.  He does not report any headaches, blurry vision, syncope or seizures. Does not report any fevers, chills or sweats.  Does not report any cough, wheezing or hemoptysis.  Does not report any chest pain, palpitation, orthopnea or leg edema.  Does not report any nausea, vomiting or abdominal pain.  Does not report any constipation or diarrhea.  Does not report any skeletal complaints.    Does not report frequency, urgency or hematuria.  Does not report any skin rashes or lesions. Does not report any heat or cold intolerance.  Does not report any lymphadenopathy or petechiae.  Does not report any anxiety or depression.  Remaining review of systems is negative.    Past Medical History:  Diagnosis Date  . Abnormal electrocardiogram   . Abnormal glucose   . Anxiety   . Cigarette smoker    former smoker- none now   . Erectile dysfunction   . GERD (gastroesophageal reflux disease)   . Paresthesia    . Prostate cancer [facility])   :  Past Surgical History:  Procedure Laterality Date  . NO PAST SURGERIES    . PROSTATE BIOPSY    :   Current Outpatient Medications:  .  aspirin 81 MG chewable tablet, Chew 81 mg by mouth as needed. , Disp: , Rfl:  .  Multiple Vitamins-Minerals (ONE-A-DAY MENS HEALTH FORMULA) TABS, Take 1 tablet by mouth daily.  , Disp: , Rfl:  .  naproxen (NAPROSYN) 500 MG tablet, Take 1 tablet (500 mg total) by mouth 2 (two) times daily as needed., Disp: 60 tablet, Rfl: 0 .  VIAGRA 100 MG tablet, Take 0.5-1 tablets (50-100 mg total) by mouth daily as needed for erectile dysfunction., Disp: 10 tablet, Rfl: 6  Current Facility-Administered Medications:  .  0.9 %  sodium chloride infusion, 500 mL, Intravenous, Continuous, Ladene Artist, MD:  No Known Allergies:  Family History  Problem Relation Age of Onset  . Lung cancer Father        age 4, smoker  . Leukemia Brother   . Prostate cancer Other        2 uncles and grandfather  . Colon cancer Other        GM, in her 33s  . Colon cancer Paternal Grandmother   . Lung cancer Paternal Grandfather   . Prostate cancer Paternal Grandfather   . Lung cancer Other  2 great-uncles   . Lung cancer Paternal Uncle   . Prostate cancer Paternal Uncle   . CAD Neg Hx   . Stroke Neg Hx   . Diabetes Neg Hx   . Colon polyps Neg Hx   :  Social History   Socioeconomic History  . Marital status: Single    Spouse name: Not on file  . Number of children: 2  . Years of education: Not on file  . Highest education level: Not on file  Occupational History  . 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 Financial resource strain: Not on file  . Food insecurity:    Worry: Not on file    Inability: Not on file  . Transportation needs:    Medical: Not on file    Non-medical: Not on file  Tobacco Use  . Smoking status: Former Smoker    Packs/day: 0.50    Years: 10.00    Pack years: 5.00    Types:  Cigarettes    Last attempt to quit: 08/20/2007    Years since quitting: 10.5  . Smokeless tobacco: Never Used  . Tobacco comment: 2009  Substance and Sexual Activity  . Alcohol use: Yes    Alcohol/week: 7.8 - 9.0 oz    Types: 12 Cans of beer, 1 - 3 Standard drinks or equivalent per week    Comment: beer -weekends  . Drug use: No  . Sexual activity: Yes    Comment: with the aid of Viagra  Lifestyle  . Physical activity:    Days per week: Not on file    Minutes per session: Not on file  . Stress: Not on file  Relationships  . Social connections:    Talks on phone: Not on file    Gets together: Not on file    Attends religious service: Not on file    Active member of club or organization: Not on file    Attends meetings of clubs or organizations: Not on file    Relationship status: Not on file  . Intimate partner violence:    Fear of current or ex partner: Not on file    Emotionally abused: Not on file    Physically abused: Not on file    Forced sexual activity: Not on file  Other Topics Concern  . Not on file  Social History Narrative   Lives by himself    Lost 1 child   :  Pertinent items are noted in HPI.  Exam: ECOG 0 General appearance: alert and cooperative appeared without distress. Head: atraumatic without any abnormalities. Eyes: conjunctivae/corneas clear. PERRL.  Sclera anicteric. Throat: lips, mucosa, and tongue normal; without oral thrush or ulcers. Resp: clear to auscultation bilaterally without rhonchi, wheezes or dullness to percussion. Cardio: regular rate and rhythm, S1, S2 normal, no murmur, click, rub or gallop GI: soft, non-tender; bowel sounds normal; no masses,  no organomegaly Skin: Skin color, texture, turgor normal. No rashes or lesions Lymph nodes: Cervical, supraclavicular, and axillary nodes normal. Neurologic: Grossly normal without any motor, sensory or deep tendon reflexes. Musculoskeletal: No joint deformity or effusion.  CBC     Component Value Date/Time   WBC 4.5 11/11/2017 0954   RBC 4.73 11/11/2017 0954   HGB 15.0 11/11/2017 0954   HCT 43.4 11/11/2017 0954   PLT 301.0 11/11/2017 0954   MCV 91.8 11/11/2017 0954   MCHC 34.6 11/11/2017 0954   RDW 13.5 11/11/2017 0954   LYMPHSABS 1.4 11/11/2017  0954   MONOABS 0.6 11/11/2017 0954   EOSABS 0.1 11/11/2017 0954   BASOSABS 0.0 11/11/2017 0954     Chemistry      Component Value Date/Time   NA 140 11/11/2017 0954   K 4.1 11/11/2017 0954   CL 104 11/11/2017 0954   CO2 31 11/11/2017 0954   BUN 15 11/11/2017 0954   CREATININE 0.93 11/11/2017 0954      Component Value Date/Time   CALCIUM 9.2 11/11/2017 0954   ALKPHOS 55 11/11/2017 0954   AST 15 11/11/2017 0954   ALT 20 11/11/2017 0954   BILITOT 1.3 (H) 11/11/2017 0954       Nm Bone Scan Whole Body  Result Date: 02/25/2018 CLINICAL DATA:  Prostate cancer, PSA 5.19 EXAM: NUCLEAR MEDICINE WHOLE BODY BONE SCAN TECHNIQUE: Whole body anterior and posterior images were obtained approximately 3 hours after intravenous injection of radiopharmaceutical. RADIOPHARMACEUTICALS:  21 mCi Technetium-19m MDP IV COMPARISON:  None Radiographic correlation: CT pelvis 02/25/2018 FINDINGS: Minimal uptake of tracer at the hands, ankles, and feet typically degenerative. Uptake laterally at the lower lumbar spine at approximately L3-L4 corresponding to facet degenerative changes by CT. Minimal uptake LEFT maxilla likely dental in origin. No definite worrisome sites of abnormal osseous tracer accumulation are identified to suggest osseous metastases. Expected urinary tract and soft tissue distribution of tracer. IMPRESSION: No scintigraphic evidence of osseous metastatic disease. Electronically Signed   By: Lavonia Dana M.D.   On: 02/25/2018 17:22    Assessment and Plan:   57 year old man with prostate cancer diagnosed in June 2019 with a PSA of 5.19 and found to have a Gleason score of 4+5 = 9 at the left base in addition to multiple  cores Gleason score 4+4 = 8 and 4+3 equal 7.  His case was discussed today in the prostate cancer multidisciplinary clinic.  His imaging studies were reviewed with the reviewing radiologist which showed no evidence of metastatic disease.  His pathology specimen was also discussed with the reviewing pathologist.  Treatment options were reviewed today with the patient which include primary surgical therapy versus radiation therapy and long-term androgen deprivation for 2 years.  Complication associated with both approaches were reviewed including long-term complications associated with androgen deprivation.  He will consider these options today and make his decision in the near future.  I emphasized the importance of getting definitive treatment in the near future and not delay any future treatments.  Delaying treatments will put him at risk of developing metastatic disease that would be incurable at the time.  Treatment of metastatic prostate cancer was discussed today including multiple treatment options at all for disease control and palliation of symptoms but do not offer cure.  He understands that curative options exist currently because of his localized disease.  All his questions were answered today to his satisfaction.  30  minutes was spent with the patient face-to-face today.  More than 50% of time was dedicated to patient counseling, education and discussing the natural course of  this disease including treatment options and ramification of lack of treatment.   Thank you for the referral.  I had the pleasure of meeting Danny [name] today.  A copy of this consult has been forwarded to the requesting physician.

## 2018-02-27 NOTE — Consult Note (Signed)
Multi-Disciplinary Clinic 02/27/2018    Danny [name]         MRN: [id number]  PRIMARY CARE:  Kathlene November, MD  DOB: 1961-01-01, 57 year old Male  REFERRING:  Kathlene November, MD  SSN:   PROVIDER:  Festus Aloe, M.D.    TREATING:  Raynelle Bring, M.D.    LOCATION:  [facility] [phone]    CC/HPI: CC: Prostate Cancer   Physician requesting consult: Dr. Eda Keys  PCP: Dr. Kathlene November  Location of consult: [facility] - Prostate Cancer Multidisciplinary Clinic   Danny [name] is a 57 year old gentleman who was noted to have an elevated PSA of 5.19 prompting a TRUS biopsy of the prostate by Dr. Junious Silk on 02/03/18. This demonstrated Gleason 4+5=9 adenocarcinoma of the prostate with 9 out of 12 biopsy cores positive for prostate cancer. He underwent staging studies on 02/25/18 including a bone scan and CT of the pelvis that were negative for metastatic disease.   Family history: He has an uncle and grandfather with a history of prostate cancer.   Imaging studies:  Bone scan (02/25/18): Negative for metastatic disease.  CT pelvis (02/25/18): Negative for metastatic disease.   PMH: He has a history of GERD.  PSH: No abdominal surgeries.   TNM stage: cT1c N0 M0  PSA: 5.19  Gleason score: 4+5=9  Biopsy (02/03/18): 9/12 cores positive  Left: L lateral apex (90%, 3+4=7, PNI), L apex (90%, 4+3=7), L lateral mid (95%, 3+4=7), L mid (95%, 4+3=7, PNI), L lateral base (90%, 3+4=7), L base (90%, 4+5=9, PNI)  Right: R apex (80%, 4+3=7), R mid (50%, 3+4=7), R base (60%, 4+4=8)  Prostate volume: 34.9 cc   Nomogram  OC disease: 18%  EPE: 80%  SVI: 32%  LNI: 27%  PFS (5 year, 10 year): 42%, 28%   Urinary function: IPSS is 4.  Erectile function: SHIM score is 1. He has not been sexually active recently but states that he does have a response to PDE-5 inhibitors.     ALLERGIES: No Allergies    MEDICATIONS: Viagra 100 mg tablet PRN  Adult Aspirin 81 mg tablet,  delayed release PRN  Multiple Vitamin  Naproxen 500 mg tablet     GU PSH: Locm 300-399Mg /Ml Iodine,1Ml - 02/25/2018 Prostate Needle Biopsy - 02/03/2018    NON-GU PSH: Surgical Pathology, Gross And Microscopic Examination For Prostate Needle - 02/03/2018        GU PMH: Prostate Cancer - 02/11/2018 Elevated PSA - 02/03/2018, - 12/23/2017 ED due to arterial insufficiency - 12/23/2017    NON-GU PMH: GERD     FAMILY HISTORY: Death of family member - Mother, Father Kidney Stones - Uncle, Father Lung Cancer - Grandfather, Interior and spatial designer, Father Prostate Cancer - Grandfather, Uncle     Notes: 1 son (deceased); 1 daughter    SOCIAL HISTORY: Marital Status: Single Preferred Language: English; Ethnicity: Not Hispanic Or Latino; Race: Black or African American Current Smoking Status: Patient does not smoke anymore. Has not smoked since 12/18/2006. Smoked for 6 years.  <DIV'  Tobacco Use Assessment Completed:  Used Tobacco in last 30 days?   Does not use smokeless tobacco. Does drink.  Drinks 4+ caffeinated drinks per day. Patient's occupation Pharmacist, hospital.     REVIEW OF SYSTEMS:     GU Review Male:  Patient denies frequent urination, hard to postpone urination, burning/ pain with urination, get up at night to urinate, leakage of urine,  stream starts and stops, trouble starting your streams, and have to strain to urinate .    Gastrointestinal (Upper):  Patient denies nausea and vomiting.    Gastrointestinal (Lower):  Patient denies diarrhea and constipation.    Constitutional:  Patient denies fever, night sweats, weight loss, and fatigue.    Skin:  Patient denies skin rash/ lesion and itching.    Eyes:  Patient denies blurred vision and double vision.    Ears/ Nose/ Throat:  Patient denies sore throat and sinus problems.    Hematologic/Lymphatic:  Patient denies swollen glands and easy bruising.    Cardiovascular:  Patient denies leg swelling and chest pains.    Respiratory:  Patient denies  cough and shortness of breath.    Endocrine:  Patient denies excessive thirst.    Musculoskeletal:  Patient denies back pain and joint pain.    Neurological:  Patient denies headaches and dizziness.    Psychologic:  Patient denies depression and anxiety.    VITAL SIGNS: None    GU PHYSICAL EXAMINATION:     Prostate: Prostate about 50 grams. Left lobe normal consistency, right lobe normal consistency. Symmetrical lobes. No prostate nodule. Left lobe no tenderness, right lobe no tenderness.     MULTI-SYSTEM PHYSICAL EXAMINATION:     Constitutional: Well-nourished. No physical deformities. Normally developed. Good grooming.    Neck: Neck symmetrical, not swollen. Normal tracheal position.    Respiratory: No labored breathing, no use of accessory muscles. Normal breath sounds. Clear bilaterally.    Cardiovascular: Regular rate and rhythm. Normal temperature, normal extremity pulses, no swelling, no varicosities.     Lymphatic: No enlargement of neck, axillae, groin.    Skin: No paleness, no jaundice, no cyanosis. No lesion, no ulcer, no rash.    Neurologic / Psychiatric: Oriented to time, oriented to place, oriented to person. No depression, no anxiety, no agitation.    Gastrointestinal: No mass, no tenderness, no rigidity, non obese abdomen.    Eyes: Normal conjunctivae. Normal eyelids.    Ears, Nose, Mouth, and Throat: Left ear no scars, no lesions, no masses. Right ear no scars, no lesions, no masses. Nose no scars, no lesions, no masses. Normal hearing. Normal lips.    Musculoskeletal: Normal gait and station of head and neck.          PAST DATA REVIEWED:   Source Of History:  Patient  Lab Test Review:  PSA  Records Review:  Pathology Reports, Previous Patient Records  X-Ray Review: C.T. Pelvis: Reviewed Films.  Bone Scan: Reviewed Films.      12/23/17  PSA  Total PSA 5.19 ng/mL  Free PSA 0.25 ng/mL  % Free PSA 5 % PSA   Notes:  His pathology slides and imaging studies were  reviewed today with pathology and radiology.   PROCEDURES: None   ASSESSMENT:     ICD-10 Details  1 GU:  Prostate Cancer - C61    PLAN:   Document  Letter(s):  Created for Patient: Clinical Summary   Notes:  1. Prostate cancer: I had a detailed discussion with Danny [name] and his daughter today. The patient was counseled about the natural history of prostate cancer and the standard treatment options that are available for prostate cancer. It was explained to him how his age and life expectancy, clinical stage, Gleason score, and PSA affect his prognosis, the decision to proceed with additional staging studies, as well as how that information influences recommended treatment strategies. We discussed the roles for active  surveillance, radiation therapy, surgical therapy, androgen deprivation, as well as ablative therapy options for the treatment of prostate cancer as appropriate to his individual cancer situation. We discussed the risks and benefits of these options with regard to their impact on cancer control and also in terms of potential adverse events, complications, and impact on quality of life particularly related to urinary and sexual function. The patient was encouraged to ask questions throughout the discussion today and all questions were answered to his stated satisfaction. In addition, the patient was provided with and/or directed to appropriate resources and literature for further education about prostate cancer and treatment options.   We discussed surgical therapy for prostate cancer including the different available surgical approaches. We discussed, in detail, the risks and expectations of surgery with regard to cancer control, urinary control, and erectile function as well as the expected postoperative recovery process. Additional risks of surgery including but not limited to bleeding, infection, hernia formation, nerve damage, lymphocele formation, bowel/rectal injury potentially  necessitating colostomy, damage to the urinary tract resulting in urine leakage, urethral stricture, and the cardiopulmonary risks such as myocardial infarction, stroke, death, venothromboembolism, etc. were explained. The risk of open surgical conversion for robotic/laparoscopic prostatectomy was also discussed.   Considering the high risk nature of his disease, I did recommend aggressive therapy of curative intent with either primary surgical therapy (possibly in the context of multimodality therapy) or radiation therapy plus long term ADT. We reviewed the pros and cons of each approach. Currently, Mr. Markovic is having a difficult time accepting his diagnosis and the need for aggressive therapy. He appears to have a clear understanding of his options and expressed his understanding multiple times and reiterated that all his questions were answered to his stated satisfaction. He will also be seen by Dr. Alen Blew and Freeman Caldron, PA-C today.   If he elects to proceed with surgical therapy, I have recommended a UNS RAL radical prostatectomy and BPLND. He will notify me if he would like to proceed in this fashion. I will at least offer him the opportunity to meet again prior to surgery to answer additional questions if he proceeds in this direction.   CC: Dr. Kathlene November  Dr. Eda Keys  Dr. Zola Button  Dr. Tyler Pita

## 2018-03-02 ENCOUNTER — Telehealth: Payer: Self-pay

## 2018-03-02 NOTE — Telephone Encounter (Signed)
Per 7/12 no los

## 2018-03-06 ENCOUNTER — Other Ambulatory Visit: Payer: Self-pay | Admitting: Urology

## 2018-03-06 ENCOUNTER — Encounter: Payer: Self-pay | Admitting: Medical Oncology

## 2018-03-06 ENCOUNTER — Telehealth: Payer: Self-pay | Admitting: Medical Oncology

## 2018-03-06 NOTE — Telephone Encounter (Signed)
Spoke with Danny [name] as follow up to [facility]. He has decided on robotic prostatectomy for treatment of his prostate cancer. I will notify Dr. Alinda Money of his decision. I informed him that Dr. Lynne Logan office will contact him with PT appointment and surgery date. He voiced understanding. I asked him to call me back if he does not hear from them in a week.

## 2018-03-06 NOTE — Progress Notes (Signed)
Notified Dr. Gwen Pounds of patient treatment decision. His office will contact him with appointments.

## 2018-04-13 DIAGNOSIS — M62838 Other muscle spasm: Secondary | ICD-10-CM | POA: Diagnosis not present

## 2018-04-13 DIAGNOSIS — M6281 Muscle weakness (generalized): Secondary | ICD-10-CM | POA: Diagnosis not present

## 2018-04-16 DIAGNOSIS — M6281 Muscle weakness (generalized): Secondary | ICD-10-CM | POA: Diagnosis not present

## 2018-04-16 DIAGNOSIS — N393 Stress incontinence (female) (male): Secondary | ICD-10-CM | POA: Diagnosis not present

## 2018-04-16 DIAGNOSIS — M62838 Other muscle spasm: Secondary | ICD-10-CM | POA: Diagnosis not present

## 2018-04-21 NOTE — Patient Instructions (Signed)
ABSALOM ARO  04/21/2018   Your procedure is scheduled on: 04-27-18   Report to [facility] Main  Entrance    Report to Admitting at 9:30 AM    Call this number if you have problems the morning of surgery [phone]   Remember: Do not eat food or drink liquids :After Midnight.     Take these medicines the morning of surgery with A SIP OF WATER: None                                You may not have any metal on your body including hair pins and              piercings  Do not wear jewelry, lotions, powders, cologne or deodorant              Men may shave face and neck.   Do not bring valuables to the hospital. [place].  Contacts, dentures or bridgework may not be worn into surgery.  Leave suitcase in the car. After surgery it may be brought to your room.   Special Instructions: Please follow your surgeon's prep instructions              Please read over the following fact sheets you were given: _____________________________________________________________________             [facility] - Preparing for Surgery Before surgery, you can play an important role.  Because skin is not sterile, your skin needs to be as free of germs as possible.  You can reduce the number of germs on your skin by washing with CHG (chlorahexidine gluconate) soap before surgery.  CHG is an antiseptic cleaner which kills germs and bonds with the skin to continue killing germs even after washing. Please DO NOT use if you have an allergy to CHG or antibacterial soaps.  If your skin becomes reddened/irritated stop using the CHG and inform your nurse when you arrive at Short Stay. Do not shave (including legs and underarms) for at least 48 hours prior to the first CHG shower.  You may shave your face/neck. Please follow these instructions carefully:  1.  Shower with CHG Soap the night before surgery and the  morning of Surgery.  2.  If  you choose to wash your hair, wash your hair first as usual with your  normal  shampoo.  3.  After you shampoo, rinse your hair and body thoroughly to remove the  shampoo.                           4.  Use CHG as you would any other liquid soap.  You can apply chg directly  to the skin and wash                       Gently with a scrungie or clean washcloth.  5.  Apply the CHG Soap to your body ONLY FROM THE NECK DOWN.   Do not use on face/ open                           Wound or  open sores. Avoid contact with eyes, ears mouth and genitals (private parts).                       Wash face,  Genitals (private parts) with your normal soap.             6.  Wash thoroughly, paying special attention to the area where your surgery  will be performed.  7.  Thoroughly rinse your body with warm water from the neck down.  8.  DO NOT shower/wash with your normal soap after using and rinsing off  the CHG Soap.                9.  Pat yourself dry with a clean towel.            10.  Wear clean pajamas.            11.  Place clean sheets on your bed the night of your first shower and do not  sleep with pets. Day of Surgery : Do not apply any lotions/deodorants the morning of surgery.  Please wear clean clothes to the hospital/surgery center.  FAILURE TO FOLLOW THESE INSTRUCTIONS MAY RESULT IN THE CANCELLATION OF YOUR SURGERY PATIENT SIGNATURE_________________________________  NURSE SIGNATURE__________________________________  ________________________________________________________________________

## 2018-04-22 DIAGNOSIS — M62838 Other muscle spasm: Secondary | ICD-10-CM | POA: Diagnosis not present

## 2018-04-22 DIAGNOSIS — N393 Stress incontinence (female) (male): Secondary | ICD-10-CM | POA: Diagnosis not present

## 2018-04-22 DIAGNOSIS — M6281 Muscle weakness (generalized): Secondary | ICD-10-CM | POA: Diagnosis not present

## 2018-04-23 ENCOUNTER — Encounter (HOSPITAL_COMMUNITY): Payer: Self-pay

## 2018-04-23 ENCOUNTER — Encounter (INDEPENDENT_AMBULATORY_CARE_PROVIDER_SITE_OTHER): Payer: Self-pay

## 2018-04-23 ENCOUNTER — Other Ambulatory Visit: Payer: Self-pay

## 2018-04-23 ENCOUNTER — Encounter (HOSPITAL_COMMUNITY)
Admission: RE | Admit: 2018-04-23 | Discharge: 2018-04-23 | Disposition: A | Discharge status: Home / Self Care | Payer: 59 | Source: Ambulatory Visit | Attending: Urology | Admitting: Urology

## 2018-04-23 DIAGNOSIS — Z01812 Encounter for preprocedural laboratory examination: Secondary | ICD-10-CM | POA: Insufficient documentation

## 2018-04-23 LAB — BASIC METABOLIC PANEL
Anion gap: 7 (ref 5–15)
BUN: 16 mg/dL (ref 6–20)
CHLORIDE: 107 mmol/L (ref 98–111)
CO2: 27 mmol/L (ref 22–32)
CREATININE: 0.98 mg/dL (ref 0.61–1.24)
Calcium: 9.3 mg/dL (ref 8.9–10.3)
GFR calc Af Amer: >60 mL/min (ref >60)
GFR calc non Af Amer: >60 mL/min (ref >60)
Glucose, Bld: 115 mg/dL — ABNORMAL HIGH (ref 70–99)
Potassium: 4.6 mmol/L (ref 3.5–5.1)
Sodium: 141 mmol/L (ref 135–145)

## 2018-04-23 LAB — CBC
HCT: 44.1 % (ref 39.0–52.0)
Hemoglobin: 15.1 g/dL (ref 13.0–17.0)
MCH: 30.8 pg (ref 26.0–34.0)
MCHC: 34.2 g/dL (ref 30.0–36.0)
MCV: 90 fL (ref 78.0–100.0)
PLATELETS: 287 10*3/uL (ref 150–400)
RBC: 4.9 MIL/uL (ref 4.22–5.81)
RDW: 13.2 % (ref 11.5–15.5)
WBC: 3.7 10*3/uL — ABNORMAL LOW (ref 4.0–10.5)

## 2018-04-23 LAB — ABO/RH: ABO/RH(D): B POS

## 2018-04-27 ENCOUNTER — Encounter (HOSPITAL_COMMUNITY): Payer: Self-pay | Admitting: Certified Registered Nurse Anesthetist

## 2018-04-27 ENCOUNTER — Other Ambulatory Visit: Payer: Self-pay

## 2018-04-27 ENCOUNTER — Ambulatory Visit (HOSPITAL_COMMUNITY): Payer: 59 | Admitting: Certified Registered Nurse Anesthetist

## 2018-04-27 ENCOUNTER — Encounter (HOSPITAL_COMMUNITY)
Admission: RE | Disposition: A | Discharge status: Home / Self Care | Payer: Self-pay | Source: Ambulatory Visit | Attending: Urology

## 2018-04-27 ENCOUNTER — Observation Stay (HOSPITAL_COMMUNITY)
Admission: RE | Admit: 2018-04-27 | Discharge: 2018-04-28 | Disposition: A | Discharge status: Home / Self Care | Payer: 59 | Source: Ambulatory Visit | Attending: Urology | Admitting: Urology

## 2018-04-27 DIAGNOSIS — N5201 Erectile dysfunction due to arterial insufficiency: Secondary | ICD-10-CM | POA: Insufficient documentation

## 2018-04-27 DIAGNOSIS — Z8042 Family history of malignant neoplasm of prostate: Secondary | ICD-10-CM | POA: Diagnosis not present

## 2018-04-27 DIAGNOSIS — Z7982 Long term (current) use of aspirin: Secondary | ICD-10-CM | POA: Insufficient documentation

## 2018-04-27 DIAGNOSIS — Z791 Long term (current) use of non-steroidal anti-inflammatories (NSAID): Secondary | ICD-10-CM | POA: Diagnosis not present

## 2018-04-27 DIAGNOSIS — Z79899 Other long term (current) drug therapy: Secondary | ICD-10-CM | POA: Diagnosis not present

## 2018-04-27 DIAGNOSIS — Z87891 Personal history of nicotine dependence: Secondary | ICD-10-CM | POA: Diagnosis not present

## 2018-04-27 DIAGNOSIS — C61 Malignant neoplasm of prostate: Secondary | ICD-10-CM | POA: Diagnosis not present

## 2018-04-27 DIAGNOSIS — N529 Male erectile dysfunction, unspecified: Secondary | ICD-10-CM | POA: Diagnosis not present

## 2018-04-27 HISTORY — PX: ROBOT ASSISTED LAPAROSCOPIC RADICAL PROSTATECTOMY: SHX5141

## 2018-04-27 HISTORY — PX: LYMPHADENECTOMY: SHX5960

## 2018-04-27 LAB — TYPE AND SCREEN
ABO/RH(D): B POS
Antibody Screen: NEGATIVE

## 2018-04-27 LAB — HEMOGLOBIN AND HEMATOCRIT, BLOOD
HCT: 40.2 % (ref 39.0–52.0)
Hemoglobin: 13.9 g/dL (ref 13.0–17.0)

## 2018-04-27 SURGERY — XI ROBOTIC ASSISTED LAPAROSCOPIC RADICAL PROSTATECTOMY LEVEL 2
Anesthesia: General

## 2018-04-27 MED ORDER — MAGNESIUM CITRATE PO SOLN: 1.0000 | Freq: Once | ORAL | Status: DC

## 2018-04-27 MED ORDER — FENTANYL CITRATE (PF) 250 MCG/5ML IJ SOLN
INTRAMUSCULAR | Status: AC
  Filled 2018-04-27: qty 5

## 2018-04-27 MED ORDER — LIDOCAINE 2% (20 MG/ML) 5 ML SYRINGE
INTRAMUSCULAR | Status: DC | PRN
  Administered 2018-04-27: 100 mg via INTRAVENOUS

## 2018-04-27 MED ORDER — HYDROMORPHONE HCL 1 MG/ML IJ SOLN
0.2500 mg | INTRAMUSCULAR | Status: DC | PRN
  Administered 2018-04-27 (×4): 0.5 mg via INTRAVENOUS

## 2018-04-27 MED ORDER — ONDANSETRON HCL 4 MG/2ML IJ SOLN: 4.0000 mg | INTRAMUSCULAR | Status: DC | PRN

## 2018-04-27 MED ORDER — BACITRACIN-NEOMYCIN-POLYMYXIN 400-5-5000 EX OINT: 1.0000 "application " | TOPICAL_OINTMENT | Freq: Three times a day (TID) | CUTANEOUS | Status: DC | PRN

## 2018-04-27 MED ORDER — LACTATED RINGERS IV SOLN
INTRAVENOUS | Status: DC | PRN
  Administered 2018-04-27: 1000 mL

## 2018-04-27 MED ORDER — SUGAMMADEX SODIUM 200 MG/2ML IV SOLN
INTRAVENOUS | Status: DC | PRN
  Administered 2018-04-27: 200 mg via INTRAVENOUS

## 2018-04-27 MED ORDER — FLEET ENEMA 7-19 GM/118ML RE ENEM: 1.0000 | ENEMA | Freq: Once | RECTAL | Status: DC

## 2018-04-27 MED ORDER — FENTANYL CITRATE (PF) 100 MCG/2ML IJ SOLN
INTRAMUSCULAR | Status: AC
  Filled 2018-04-27: qty 2

## 2018-04-27 MED ORDER — FENTANYL CITRATE (PF) 100 MCG/2ML IJ SOLN
INTRAMUSCULAR | Status: DC | PRN
  Administered 2018-04-27 (×6): 50 ug via INTRAVENOUS
  Administered 2018-04-27: 100 ug via INTRAVENOUS
  Administered 2018-04-27 (×2): 25 ug via INTRAVENOUS

## 2018-04-27 MED ORDER — ORAL CARE MOUTH RINSE
15.0000 mL | Freq: Two times a day (BID) | OROMUCOSAL | Status: DC
  Administered 2018-04-27: 15 mL via OROMUCOSAL

## 2018-04-27 MED ORDER — SODIUM CHLORIDE 0.9 % IR SOLN
Status: DC | PRN
  Administered 2018-04-27: 1000 mL

## 2018-04-27 MED ORDER — PROPOFOL 10 MG/ML IV BOLUS
INTRAVENOUS | Status: AC
  Filled 2018-04-27: qty 20

## 2018-04-27 MED ORDER — CEFAZOLIN SODIUM-DEXTROSE 1-4 GM/50ML-% IV SOLN
1.0000 g | Freq: Three times a day (TID) | INTRAVENOUS | Status: AC
  Administered 2018-04-27 – 2018-04-28 (×2): 1 g via INTRAVENOUS
  Filled 2018-04-27 (×2): qty 50

## 2018-04-27 MED ORDER — SULFAMETHOXAZOLE-TRIMETHOPRIM 800-160 MG PO TABS: 1.0000 | ORAL_TABLET | Freq: Two times a day (BID) | ORAL | 0 refills | Status: DC

## 2018-04-27 MED ORDER — LACTATED RINGERS IV SOLN
INTRAVENOUS | Status: DC
  Administered 2018-04-27 (×2): via INTRAVENOUS

## 2018-04-27 MED ORDER — DEXAMETHASONE SODIUM PHOSPHATE 10 MG/ML IJ SOLN
INTRAMUSCULAR | Status: DC | PRN
  Administered 2018-04-27: 10 mg via INTRAVENOUS

## 2018-04-27 MED ORDER — KETOROLAC TROMETHAMINE 30 MG/ML IJ SOLN
INTRAMUSCULAR | Status: AC
  Filled 2018-04-27: qty 1

## 2018-04-27 MED ORDER — BELLADONNA ALKALOIDS-OPIUM 16.2-60 MG RE SUPP
1.0000 | Freq: Once | RECTAL | Status: AC
  Administered 2018-04-27: 1 via RECTAL
  Filled 2018-04-27: qty 1

## 2018-04-27 MED ORDER — CEFAZOLIN SODIUM-DEXTROSE 2-4 GM/100ML-% IV SOLN
2.0000 g | Freq: Once | INTRAVENOUS | Status: AC
  Administered 2018-04-27: 2 g via INTRAVENOUS
  Filled 2018-04-27: qty 100

## 2018-04-27 MED ORDER — DIPHENHYDRAMINE HCL 50 MG/ML IJ SOLN: 12.5000 mg | Freq: Four times a day (QID) | INTRAMUSCULAR | Status: DC | PRN

## 2018-04-27 MED ORDER — PROPOFOL 10 MG/ML IV BOLUS
INTRAVENOUS | Status: DC | PRN
  Administered 2018-04-27: 170 mg via INTRAVENOUS

## 2018-04-27 MED ORDER — KCL IN DEXTROSE-NACL 20-5-0.45 MEQ/L-%-% IV SOLN
INTRAVENOUS | Status: DC
  Administered 2018-04-27 – 2018-04-28 (×2): via INTRAVENOUS
  Filled 2018-04-27 (×3): qty 1000

## 2018-04-27 MED ORDER — DEXAMETHASONE SODIUM PHOSPHATE 10 MG/ML IJ SOLN
INTRAMUSCULAR | Status: AC
  Filled 2018-04-27: qty 1

## 2018-04-27 MED ORDER — EPHEDRINE SULFATE-NACL 50-0.9 MG/10ML-% IV SOSY
PREFILLED_SYRINGE | INTRAVENOUS | Status: DC | PRN
  Administered 2018-04-27: 10 mg via INTRAVENOUS

## 2018-04-27 MED ORDER — ONDANSETRON HCL 4 MG/2ML IJ SOLN
INTRAMUSCULAR | Status: AC
  Filled 2018-04-27: qty 2

## 2018-04-27 MED ORDER — ROCURONIUM BROMIDE 10 MG/ML (PF) SYRINGE
PREFILLED_SYRINGE | INTRAVENOUS | Status: AC
  Filled 2018-04-27: qty 10

## 2018-04-27 MED ORDER — PROMETHAZINE HCL 25 MG/ML IJ SOLN: 6.2500 mg | INTRAMUSCULAR | Status: DC | PRN

## 2018-04-27 MED ORDER — SODIUM CHLORIDE 0.9 % IJ SOLN
INTRAMUSCULAR | Status: AC
  Filled 2018-04-27: qty 100

## 2018-04-27 MED ORDER — LIDOCAINE 2% (20 MG/ML) 5 ML SYRINGE
INTRAMUSCULAR | Status: AC
  Filled 2018-04-27: qty 5

## 2018-04-27 MED ORDER — BUPIVACAINE-EPINEPHRINE (PF) 0.5% -1:200000 IJ SOLN
INTRAMUSCULAR | Status: AC
  Filled 2018-04-27: qty 30

## 2018-04-27 MED ORDER — KETOROLAC TROMETHAMINE 15 MG/ML IJ SOLN
15.0000 mg | Freq: Four times a day (QID) | INTRAMUSCULAR | Status: DC
  Administered 2018-04-27 – 2018-04-28 (×3): 15 mg via INTRAVENOUS
  Filled 2018-04-27 (×3): qty 1

## 2018-04-27 MED ORDER — ONDANSETRON HCL 4 MG/2ML IJ SOLN
INTRAMUSCULAR | Status: DC | PRN
  Administered 2018-04-27 (×2): 4 mg via INTRAVENOUS

## 2018-04-27 MED ORDER — DOCUSATE SODIUM 100 MG PO CAPS
100.0000 mg | ORAL_CAPSULE | Freq: Two times a day (BID) | ORAL | Status: DC
  Administered 2018-04-27 – 2018-04-28 (×2): 100 mg via ORAL
  Filled 2018-04-27 (×2): qty 1

## 2018-04-27 MED ORDER — SODIUM CHLORIDE 0.9 % IV BOLUS
1000.0000 mL | Freq: Once | INTRAVENOUS | Status: AC
  Administered 2018-04-27: 1000 mL via INTRAVENOUS

## 2018-04-27 MED ORDER — MIDAZOLAM HCL 2 MG/2ML IJ SOLN
INTRAMUSCULAR | Status: AC
  Filled 2018-04-27: qty 2

## 2018-04-27 MED ORDER — ACETAMINOPHEN 325 MG PO TABS: 650.0000 mg | ORAL_TABLET | ORAL | Status: DC | PRN

## 2018-04-27 MED ORDER — MORPHINE SULFATE (PF) 2 MG/ML IV SOLN
2.0000 mg | INTRAVENOUS | Status: DC | PRN
  Administered 2018-04-27: 4 mg via INTRAVENOUS
  Administered 2018-04-27: 3 mg via INTRAVENOUS
  Administered 2018-04-27: 4 mg via INTRAVENOUS
  Filled 2018-04-27 (×3): qty 2

## 2018-04-27 MED ORDER — HYDROMORPHONE HCL 1 MG/ML IJ SOLN
INTRAMUSCULAR | Status: AC
  Filled 2018-04-27: qty 2

## 2018-04-27 MED ORDER — MIDAZOLAM HCL 2 MG/2ML IJ SOLN
INTRAMUSCULAR | Status: DC | PRN
  Administered 2018-04-27: 2 mg via INTRAVENOUS

## 2018-04-27 MED ORDER — BUPIVACAINE-EPINEPHRINE (PF) 0.5% -1:200000 IJ SOLN
INTRAMUSCULAR | Status: DC | PRN
  Administered 2018-04-27: 30 mL

## 2018-04-27 MED ORDER — TRAMADOL HCL 50 MG PO TABS: 50.0000 mg | ORAL_TABLET | Freq: Four times a day (QID) | ORAL | 0 refills | Status: DC | PRN

## 2018-04-27 MED ORDER — ROCURONIUM BROMIDE 50 MG/5ML IV SOSY
PREFILLED_SYRINGE | INTRAVENOUS | Status: DC | PRN
  Administered 2018-04-27: 50 mg via INTRAVENOUS
  Administered 2018-04-27 (×2): 20 mg via INTRAVENOUS

## 2018-04-27 MED ORDER — DIPHENHYDRAMINE HCL 12.5 MG/5ML PO ELIX: 12.5000 mg | ORAL_SOLUTION | Freq: Four times a day (QID) | ORAL | Status: DC | PRN

## 2018-04-27 MED ORDER — HEPARIN SODIUM (PORCINE) 1000 UNIT/ML IJ SOLN
INTRAMUSCULAR | Status: AC
  Filled 2018-04-27: qty 1

## 2018-04-27 MED ORDER — KETOROLAC TROMETHAMINE 30 MG/ML IJ SOLN
30.0000 mg | Freq: Once | INTRAMUSCULAR | Status: AC | PRN
  Administered 2018-04-27: 30 mg via INTRAVENOUS

## 2018-04-27 SURGICAL SUPPLY — 57 items
ADH SKN CLS APL DERMABOND .7 (GAUZE/BANDAGES/DRESSINGS) ×2
APL SWBSTK 6 STRL LF DISP (MISCELLANEOUS) ×2
APPLICATOR COTTON TIP 6 STRL (MISCELLANEOUS) ×2 IMPLANT
APPLICATOR COTTON TIP 6IN STRL (MISCELLANEOUS) ×3
CATH FOLEY 2WAY SLVR 18FR 30CC (CATHETERS) ×3 IMPLANT
CATH ROBINSON RED A/P 16FR (CATHETERS) ×3 IMPLANT
CATH ROBINSON RED A/P 8FR (CATHETERS) ×3 IMPLANT
CATH TIEMANN FOLEY 18FR 5CC (CATHETERS) ×3 IMPLANT
CHLORAPREP W/TINT 26ML (MISCELLANEOUS) ×3 IMPLANT
CLIP VESOLOCK LG 6/CT PURPLE (CLIP) ×7 IMPLANT
COVER SURGICAL LIGHT HANDLE (MISCELLANEOUS) ×3 IMPLANT
COVER TIP SHEARS 8 DVNC (MISCELLANEOUS) ×2 IMPLANT
COVER TIP SHEARS 8MM DA VINCI (MISCELLANEOUS) ×2
CUTTER ECHEON FLEX ENDO 45 340 (ENDOMECHANICALS) ×3 IMPLANT
DECANTER SPIKE VIAL GLASS SM (MISCELLANEOUS) ×3 IMPLANT
DERMABOND ADVANCED (GAUZE/BANDAGES/DRESSINGS) ×1
DERMABOND ADVANCED .7 DNX12 (GAUZE/BANDAGES/DRESSINGS) IMPLANT
DRAPE ARM DVNC X/XI (DISPOSABLE) ×8 IMPLANT
DRAPE COLUMN DVNC XI (DISPOSABLE) ×2 IMPLANT
DRAPE DA VINCI XI ARM (DISPOSABLE) ×4
DRAPE DA VINCI XI COLUMN (DISPOSABLE) ×1
DRAPE SURG IRRIG POUCH 19X23 (DRAPES) ×3 IMPLANT
DRSG TEGADERM 4X4.75 (GAUZE/BANDAGES/DRESSINGS) ×3 IMPLANT
ELECT REM PT RETURN 15FT ADLT (MISCELLANEOUS) ×3 IMPLANT
GLOVE BIO SURGEON STRL SZ 6.5 (GLOVE) ×3 IMPLANT
GLOVE BIOGEL M STRL SZ7.5 (GLOVE) ×6 IMPLANT
GOWN STRL REUS W/TWL LRG LVL3 (GOWN DISPOSABLE) ×9 IMPLANT
HEMOSTAT SURGICEL 2X3 (HEMOSTASIS) ×1 IMPLANT
HOLDER FOLEY CATH W/STRAP (MISCELLANEOUS) ×3 IMPLANT
IRRIG SUCT STRYKERFLOW 2 WTIP (MISCELLANEOUS) ×3
IRRIGATION SUCT STRKRFLW 2 WTP (MISCELLANEOUS) ×2 IMPLANT
IV LACTATED RINGERS 1000ML (IV SOLUTION) ×2 IMPLANT
NDL SAFETY ECLIPSE 18X1.5 (NEEDLE) ×2 IMPLANT
NEEDLE HYPO 18GX1.5 SHARP (NEEDLE) ×3
PACK ROBOT UROLOGY CUSTOM (CUSTOM PROCEDURE TRAY) ×3 IMPLANT
RELOAD STAPLE 45 4.1 GRN THCK (STAPLE) ×2 IMPLANT
SEAL CANN UNIV 5-8 DVNC XI (MISCELLANEOUS) ×8 IMPLANT
SEAL XI 5MM-8MM UNIVERSAL (MISCELLANEOUS) ×4
SOLUTION ELECTROLUBE (MISCELLANEOUS) ×3 IMPLANT
STAPLE RELOAD 45 GRN (STAPLE) ×2 IMPLANT
STAPLE RELOAD 45MM GREEN (STAPLE) ×3
SUT ETHILON 3 0 PS 1 (SUTURE) ×3 IMPLANT
SUT MNCRL 3 0 RB1 (SUTURE) ×2 IMPLANT
SUT MNCRL 3 0 VIOLET RB1 (SUTURE) ×2 IMPLANT
SUT MNCRL AB 4-0 PS2 18 (SUTURE) ×6 IMPLANT
SUT MONOCRYL 3 0 RB1 (SUTURE) ×2
SUT VIC AB 0 CT1 27 (SUTURE) ×3
SUT VIC AB 0 CT1 27XBRD ANTBC (SUTURE) ×2 IMPLANT
SUT VIC AB 0 UR5 27 (SUTURE) ×3 IMPLANT
SUT VIC AB 2-0 SH 27 (SUTURE) ×3
SUT VIC AB 2-0 SH 27X BRD (SUTURE) ×2 IMPLANT
SUT VICRYL 0 UR6 27IN ABS (SUTURE) ×6 IMPLANT
SYR 27GX1/2 1ML LL SAFETY (SYRINGE) ×3 IMPLANT
TOWEL OR 17X26 10 PK STRL BLUE (TOWEL DISPOSABLE) ×3 IMPLANT
TOWEL OR NON WOVEN STRL DISP B (DISPOSABLE) ×3 IMPLANT
TUBING INSUFFLATION 10FT LAP (TUBING) IMPLANT
WATER STERILE IRR 1000ML POUR (IV SOLUTION) ×6 IMPLANT

## 2018-04-27 NOTE — Discharge Instructions (Signed)

## 2018-04-27 NOTE — Transfer of Care (Signed)
Immediate Anesthesia Transfer of Care Note  Patient: Danny [name]  Procedure(s) Performed: XI ROBOTIC ASSISTED LAPAROSCOPIC RADICAL PROSTATECTOMY LEVEL 2 (N/A ) LYMPHADENECTOMY, BILATERAL PELVIC (Bilateral )  Patient Location: PACU  Anesthesia Type:General  Level of Consciousness: awake, alert  and oriented  Airway & Oxygen Therapy: Patient Spontanous Breathing and Patient connected to face mask oxygen  Post-op Assessment: Report given to RN and Post -op Vital signs reviewed and stable  Post vital signs: Reviewed and stable  Last Vitals:  Vitals Value Taken Time  BP    Temp    Pulse 85 04/27/2018  3:31 PM  Resp    SpO2 100 % 04/27/2018  3:31 PM  Vitals shown include unvalidated device data.  Last Pain:  Vitals:   04/27/18 0940  TempSrc: Oral         Complications: No apparent anesthesia complications

## 2018-04-27 NOTE — Progress Notes (Signed)
Patient ID: Danny [name], male   DOB: 05/22/1961, 57 y.o.   MRN: [id number]  Post-op note  Subjective: The patient is doing well.  Complains of bladder spasms.  Objective: Vital signs in last 24 hours: Temp:  [98.4 F (36.9 C)-99.1 F (37.3 C)] 99.1 F (37.3 C) (09/09 1802) Pulse Rate:  [59-104] 81 (09/09 1802) Resp:  [10-23] 12 (09/09 1802) BP: (114-146)/(70-87) 133/73 (09/09 1802) SpO2:  [92 %-100 %] 100 % (09/09 1802) Weight:  [83 kg] 83 kg (09/09 0949)  Intake/Output from previous day: No intake/output data recorded. Intake/Output this shift: Total I/O In: 1200 [I.V.:1200] Out: 50 [Blood:50]  Physical Exam:  General: Alert and oriented. Abdomen: Soft, Nondistended. Incisions: Clean and dry. GU: Urine clear.  Lab Results: Recent Labs    04/27/18 1620  HGB 13.9  HCT 40.2    Assessment/Plan: POD#0   1) Continue to monitor, ambulate, IS, B&O suppository   Roxy Horseman, Brooke Bonito. MD   LOS: 0 days   Destan Franchini,LES 04/27/2018, 6:21 PM

## 2018-04-27 NOTE — H&P (Signed)
CC/HPI: CC: Prostate Cancer   Physician requesting consult: Dr. Eda Keys  PCP: Dr. Kathlene November  Location of consult: [facility] - Prostate Cancer Multidisciplinary Clinic   Mr. Docken is a 57 year old gentleman who was noted to have an elevated PSA of 5.19 prompting a TRUS biopsy of the prostate by Dr. Junious Silk on 02/03/18. This demonstrated Gleason 4+5=9 adenocarcinoma of the prostate with 9 out of 12 biopsy cores positive for prostate cancer. He underwent staging studies on 02/25/18 including a bone scan and CT of the pelvis that were negative for metastatic disease.   Family history: He has an uncle and grandfather with a history of prostate cancer.   Imaging studies:  Bone scan (02/25/18): Negative for metastatic disease.  CT pelvis (02/25/18): Negative for metastatic disease.   PMH: He has a history of GERD.  PSH: No abdominal surgeries.   TNM stage: cT1c N0 M0  PSA: 5.19  Gleason score: 4+5=9  Biopsy (02/03/18): 9/12 cores positive  Left: L lateral apex (90%, 3+4=7, PNI), L apex (90%, 4+3=7), L lateral mid (95%, 3+4=7), L mid (95%, 4+3=7, PNI), L lateral base (90%, 3+4=7), L base (90%, 4+5=9, PNI)  Right: R apex (80%, 4+3=7), R mid (50%, 3+4=7), R base (60%, 4+4=8)  Prostate volume: 34.9 cc   Nomogram  OC disease: 18%  EPE: 80%  SVI: 32%  LNI: 27%  PFS (5 year, 10 year): 42%, 28%   Urinary function: IPSS is 4.  Erectile function: SHIM score is 1. He has not been sexually active recently but states that he does have a response to PDE-5 inhibitors.     ALLERGIES: No Allergies    MEDICATIONS: Viagra 100 mg tablet PRN  Adult Aspirin 81 mg tablet, delayed release PRN  Multiple Vitamin  Naproxen 500 mg tablet     GU PSH: Locm 300-399Mg /Ml Iodine,1Ml - 02/25/2018 Prostate Needle Biopsy - 02/03/2018    NON-GU PSH: Surgical Pathology, Gross And Microscopic Examination For Prostate Needle - 02/03/2018        GU PMH: Prostate Cancer - 02/11/2018 Elevated PSA -  02/03/2018, - 12/23/2017 ED due to arterial insufficiency - 12/23/2017    NON-GU PMH: GERD     FAMILY HISTORY: Death of family member - Mother, Father Kidney Stones - Uncle, Father Lung Cancer - Grandfather, Interior and spatial designer, Father Prostate Cancer - Grandfather, Uncle     Notes: 1 son (deceased); 1 daughter    SOCIAL HISTORY: Marital Status: Single Preferred Language: English; Ethnicity: Not Hispanic Or Latino; Race: Black or African American Current Smoking Status: Patient does not smoke anymore. Has not smoked since 12/18/2006. Smoked for 6 years.  <DIV'  Tobacco Use Assessment Completed:  Used Tobacco in last 30 days?   Does not use smokeless tobacco. Does drink.  Drinks 4+ caffeinated drinks per day. Patient's occupation Pharmacist, hospital.     REVIEW OF SYSTEMS:     GU Review Male:  Patient denies frequent urination, hard to postpone urination, burning/ pain with urination, get up at night to urinate, leakage of urine, stream starts and stops, trouble starting your streams, and have to strain to urinate .    Gastrointestinal (Upper):  Patient denies nausea and vomiting.    Gastrointestinal (Lower):  Patient denies diarrhea and constipation.    Constitutional:  Patient denies fever, night sweats, weight loss, and fatigue.    Skin:  Patient denies skin rash/ lesion and itching.    Eyes:  Patient denies blurred vision and  double vision.    Ears/ Nose/ Throat:  Patient denies sore throat and sinus problems.    Hematologic/Lymphatic:  Patient denies swollen glands and easy bruising.    Cardiovascular:  Patient denies leg swelling and chest pains.    Respiratory:  Patient denies cough and shortness of breath.    Endocrine:  Patient denies excessive thirst.    Musculoskeletal:  Patient denies back pain and joint pain.    Neurological:  Patient denies headaches and dizziness.    Psychologic:  Patient denies depression and anxiety.         GU PHYSICAL EXAMINATION:          MULTI-SYSTEM  PHYSICAL EXAMINATION:     Constitutional: Well-nourished. No physical deformities. Normally developed. Good grooming.    Neck: Neck symmetrical, not swollen. Normal tracheal position.    Respiratory: No labored breathing, no use of accessory muscles. Normal breath sounds. Clear bilaterally.    Cardiovascular: Regular rate and rhythm. Normal temperature, normal extremity pulses, no swelling, no varicosities.     Lymphatic: No enlargement of neck, axillae, groin.    Skin: No paleness, no jaundice, no cyanosis. No lesion, no ulcer, no rash.    Neurologic / Psychiatric: Oriented to time, oriented to place, oriented to person. No depression, no anxiety, no agitation.    Gastrointestinal: No mass, no tenderness, no rigidity, non obese abdomen.    Eyes: Normal conjunctivae. Normal eyelids.    Ears, Nose, Mouth, and Throat: Left ear no scars, no lesions, no masses. Right ear no scars, no lesions, no masses. Nose no scars, no lesions, no masses. Normal hearing. Normal lips.    Musculoskeletal: Normal gait and station of head and neck.            ASSESSMENT:     ICD-10 Details  1 GU:  Prostate Cancer - C61    PLAN:   He has elected to proceed with primary surgical therapy and will undergo a RAL radical prostatectomy and BPLND.

## 2018-04-27 NOTE — Plan of Care (Signed)

## 2018-04-27 NOTE — Anesthesia Procedure Notes (Signed)
Procedure Name: Intubation Date/Time: 04/27/2018 11:58 AM Performed by: Genelle Bal, CRNA Pre-anesthesia Checklist: Patient identified, Emergency Drugs available, Suction available and Patient being monitored Patient Re-evaluated:Patient Re-evaluated prior to induction Oxygen Delivery Method: Circle system utilized Preoxygenation: Pre-oxygenation with 100% oxygen Induction Type: IV induction Ventilation: Mask ventilation without difficulty Laryngoscope Size: Miller and 2 Grade View: Grade II Tube type: Oral Tube size: 7.5 mm Number of attempts: 1 Airway Equipment and Method: Stylet Placement Confirmation: ETT inserted through vocal cords under direct vision,  positive ETCO2 and breath sounds checked- equal and bilateral Secured at: 22 cm Tube secured with: Tape Dental Injury: Teeth and Oropharynx as per pre-operative assessment

## 2018-04-27 NOTE — Anesthesia Preprocedure Evaluation (Signed)
Anesthesia Evaluation  Patient identified by MRN, date of birth, ID band Patient awake    Reviewed: Allergy & Precautions, NPO status , Patient's Chart, lab work & pertinent test results  Airway Mallampati: II  TM Distance: >3 FB Neck ROM: Full    Dental no notable dental hx.    Pulmonary neg pulmonary ROS, former smoker,    Pulmonary exam normal breath sounds clear to auscultation       Cardiovascular negative cardio ROS Normal cardiovascular exam Rhythm:Regular Rate:Normal     Neuro/Psych negative neurological ROS  negative psych ROS   GI/Hepatic Neg liver ROS, GERD  ,  Endo/Other  negative endocrine ROS  Renal/GU negative Renal ROS  negative genitourinary   Musculoskeletal negative musculoskeletal ROS (+)   Abdominal   Peds negative pediatric ROS (+)  Hematology negative hematology ROS (+)   Anesthesia Other Findings   Reproductive/Obstetrics negative OB ROS                            Anesthesia Physical Anesthesia Plan  ASA: II  Anesthesia Plan: General   Post-op Pain Management:    Induction: Intravenous  PONV Risk Score and Plan: 2 and Ondansetron, Dexamethasone and Treatment may vary due to age or medical condition  Airway Management Planned: Oral ETT  Additional Equipment:   Intra-op Plan:   Post-operative Plan: Extubation in OR  Informed Consent: I have reviewed the patients History and Physical, chart, labs and discussed the procedure including the risks, benefits and alternatives for the proposed anesthesia with the patient or authorized representative who has indicated his/her understanding and acceptance.   Dental advisory given  Plan Discussed with: CRNA and Surgeon  Anesthesia Plan Comments:         Anesthesia Quick Evaluation

## 2018-04-27 NOTE — Anesthesia Postprocedure Evaluation (Signed)
Anesthesia Post Note  Patient: Danny [name]  Procedure(s) Performed: XI ROBOTIC ASSISTED LAPAROSCOPIC RADICAL PROSTATECTOMY LEVEL 2 (N/A ) LYMPHADENECTOMY, BILATERAL PELVIC (Bilateral )     Patient location during evaluation: PACU Anesthesia Type: General Level of consciousness: awake and alert Pain management: pain level controlled Vital Signs Assessment: post-procedure vital signs reviewed and stable Respiratory status: spontaneous breathing, nonlabored ventilation, respiratory function stable and patient connected to nasal cannula oxygen Cardiovascular status: blood pressure returned to baseline and stable Postop Assessment: no apparent nausea or vomiting Anesthetic complications: no    Last Vitals:  Vitals:   04/27/18 1600 04/27/18 1613  BP: (!) 144/87   Pulse: 82 (!) 101  Resp: 12 (!) 23  Temp:    SpO2: 100% 97%    Last Pain:  Vitals:   04/27/18 1613  TempSrc:   PainSc: 10-Worst pain ever                 Kelsey Edman S

## 2018-04-27 NOTE — Op Note (Signed)
Preoperative diagnosis: Clinically localized adenocarcinoma of the prostate (clinical stage T1c N0 M0)  Postoperative diagnosis: Clinically localized adenocarcinoma of the prostate (clinical stage T1c N0 M0)  Procedure:  1. Robotic assisted laparoscopic radical prostatectomy (right nerve sparing) 2. Bilateral robotic assisted laparoscopic extended pelvic lymphadenectomy  Surgeon: Pryor Curia. M.D.  Assistant(s): Debbrah Alar, PA-C  An assistant was required for this surgical procedure.  The duties of the assistant included but were not limited to suctioning, passing suture, camera manipulation, retraction. This procedure would not be able to be performed without an Environmental consultant.   Resident: Dr. Basilio Cairo  Anesthesia: General  Complications: None  EBL: 50 mL  IVF:  1200 mL crystalloid  Specimens: 1. Prostate and seminal vesicles 2. Right pelvic lymph nodes 3. Left pelvic lymph nodes  Disposition of specimens: Pathology  Drains: 1. 20 Fr coude catheter 2. # 19 Blake pelvic drain  Indication: Danny [name] is a 57 y.o. patient with clinically localized prostate cancer.  After a thorough review of the management options for treatment of prostate cancer, he elected to proceed with surgical therapy and the above procedure(s).  We have discussed the potential benefits and risks of the procedure, side effects of the proposed treatment, the likelihood of the patient achieving the goals of the procedure, and any potential problems that might occur during the procedure or recuperation. Informed consent has been obtained.  Description of procedure:  The patient was taken to the operating room and a general anesthetic was administered. He was given preoperative antibiotics, placed in the dorsal lithotomy position, and prepped and draped in the usual sterile fashion. Next a preoperative timeout was performed. A urethral catheter was placed into the bladder and a site was  selected near the umbilicus for placement of the camera port. This was placed using a standard open Hassan technique which allowed entry into the peritoneal cavity under direct vision and without difficulty. An 8 mm port was placed and a pneumoperitoneum established. The camera was then used to inspect the abdomen and there was no evidence of any intra-abdominal injuries or other abnormalities. The remaining abdominal ports were then placed. 8 mm robotic ports were placed in the right lower quadrant, left lower quadrant, and far left lateral abdominal wall. A 5 mm port was placed in the right upper quadrant and a 12 mm port was placed in the right lateral abdominal wall for laparoscopic assistance. All ports were placed under direct vision without difficulty. The surgical cart was then docked.   Utilizing the cautery scissors, the bladder was reflected posteriorly allowing entry into the space of Retzius and identification of the endopelvic fascia and prostate. The periprostatic fat was then removed from the prostate allowing full exposure of the endopelvic fascia. The endopelvic fascia was then incised from the apex back to the base of the prostate bilaterally and the underlying levator muscle fibers were swept laterally off the prostate thereby isolating the dorsal venous complex. The dorsal vein was then stapled and divided with a 45 mm Flex Echelon stapler. Attention then turned to the bladder neck which was divided anteriorly thereby allowing entry into the bladder and exposure of the urethral catheter. The catheter balloon was deflated and the catheter was brought into the operative field and used to retract the prostate anteriorly. The posterior bladder neck was then examined and was divided allowing further dissection between the bladder and prostate posteriorly until the vasa deferentia and seminal vessels were identified. The vasa deferentia were isolated,  divided, and lifted anteriorly. The seminal  vesicles were dissected down to their tips with care to control the seminal vascular arterial blood supply. These structures were then lifted anteriorly and the space between Denonvillier's fascia and the anterior rectum was developed with a combination of sharp and blunt dissection. This isolated the vascular pedicles of the prostate.  The lateral prostatic fascia on the right side of the prostate was then sharply incised allowing release of the neurovascular bundle. The vascular pedicle of the prostate on the right side was then ligated with Weck clips between the prostate and neurovascular bundle and divided with sharp cold scissor dissection resulting in neurovascular bundle preservation. On the left side, a wide non nerve sparing dissection was performed with Weck clips used to ligate the vascular pedicle of the prostate. The neurovascular bundle on the right side was then separated off the apex of the prostate and urethra.  The urethra was then sharply transected allowing the prostate specimen to be disarticulated. The pelvis was copiously irrigated and hemostasis was ensured. There was no evidence for rectal injury.  Attention then turned to the right pelvic sidewall. The fibrofatty tissue between the genitofemoral nerve, confluence of the iliac vessels, hypogastric artery, and Cooper's ligament was dissected free from the pelvic sidewall with care to preserve the obturator nerve. Weck clips were used for lymphostasis and hemostasis. An identical procedure was performed on the contralateral side and the lymphatic packets were removed for permanent pathologic analysis.  Attention then turned to the urethral anastomosis. A 2-0 Vicryl slip knot was placed between Denonvillier's fascia, the posterior bladder neck, and the posterior urethra to reapproximate these structures. A double-armed 3-0 Monocryl suture was then used to perform a 360 running tension-free anastomosis between the bladder neck and  urethra. A new urethral catheter was then placed into the bladder and irrigated. There were no blood clots within the bladder and the anastomosis appeared to be watertight. A #19 Blake drain was then brought through the left lateral 8 mm port site and positioned appropriately within the pelvis. It was secured to the skin with a nylon suture. The surgical cart was then undocked. The right lateral 12 mm port site was closed at the fascial level with a 0 Vicryl suture placed laparoscopically. All remaining ports were then removed under direct vision. The prostate specimen was removed intact within the Endopouch retrieval bag via the periumbilical camera port site. This fascial opening was closed with two running 0 Vicryl sutures. 0.25% Marcaine was then injected into all port sites and all incisions were reapproximated at the skin level with 4-0 Monocryl subcuticular sutures. Dermabond was applied. The patient appeared to tolerate the procedure well and without complications. The patient was able to be extubated and transferred to the recovery unit in satisfactory condition.   Pryor Curia MD

## 2018-04-28 ENCOUNTER — Encounter (HOSPITAL_COMMUNITY): Payer: Self-pay | Admitting: Urology

## 2018-04-28 DIAGNOSIS — C61 Malignant neoplasm of prostate: Secondary | ICD-10-CM | POA: Diagnosis not present

## 2018-04-28 LAB — HEMOGLOBIN AND HEMATOCRIT, BLOOD
HEMATOCRIT: 39.1 % (ref 39.0–52.0)
HEMOGLOBIN: 13.2 g/dL (ref 13.0–17.0)

## 2018-04-28 MED ORDER — TRAMADOL HCL 50 MG PO TABS
50.0000 mg | ORAL_TABLET | Freq: Four times a day (QID) | ORAL | Status: DC | PRN
  Administered 2018-04-28: 50 mg via ORAL
  Filled 2018-04-28: qty 1

## 2018-04-28 MED ORDER — BISACODYL 10 MG RE SUPP
10.0000 mg | Freq: Once | RECTAL | Status: AC
  Administered 2018-04-28: 10 mg via RECTAL
  Filled 2018-04-28: qty 1

## 2018-04-28 NOTE — Progress Notes (Signed)
Patient ID: Danny [name], male   DOB: Jul 24, 1961, 57 y.o.   MRN: [id number]  1 Day Post-Op Subjective: The patient is doing well.  No nausea or vomiting. Pain is adequately controlled.  Objective: Vital signs in last 24 hours: Temp:  [98.4 F (36.9 C)-99.7 F (37.6 C)] 99.7 F (37.6 C) (09/10 0604) Pulse Rate:  [54-104] 57 (09/10 0604) Resp:  [10-23] 16 (09/10 0604) BP: (105-146)/(58-87) 117/82 (09/10 0604) SpO2:  [92 %-100 %] 99 % (09/10 0604) Weight:  [83 kg] 83 kg (09/09 0949)  Intake/Output from previous day: 09/09 0701 - 09/10 0700 In: 6318 [I.V.:3185.2; IV Piggyback:3132.8] Out: 1520 [Urine:1350; Drains:120; Blood:50] Intake/Output this shift: No intake/output data recorded.  Physical Exam:  General: Alert and oriented. CV: RRR Lungs: Clear bilaterally. GI: Soft, Nondistended. Incisions: Clean, dry, and intact Urine: Clear Extremities: Nontender, no erythema, no edema.  Lab Results: Recent Labs    04/27/18 1620 04/28/18 0406  HGB 13.9 13.2  HCT 40.2 39.1      Assessment/Plan: POD# 1 s/p robotic prostatectomy.  1) SL IVF 2) Ambulate, Incentive spirometry 3) Transition to oral pain medication 4) Dulcolax suppository 5) D/C pelvic drain 6) Plan for likely discharge later today   Pryor Curia. MD   LOS: 0 days   Reverie Vaquera,LES 04/28/2018, 7:26 AM

## 2018-04-28 NOTE — Discharge Summary (Signed)
  Date of admission: 04/27/2018  Date of discharge: 04/28/2018  Admission diagnosis: Prostate Cancer  Discharge diagnosis: Prostate Cancer  History and Physical: For full details, please see admission history and physical. Briefly, Danny [name] is a 57 y.o. gentleman with localized prostate cancer.  After discussing management/treatment options, he elected to proceed with surgical treatment.  Hospital Course: THOREN HOSANG was taken to the operating room on 04/27/2018 and underwent a robotic assisted laparoscopic radical prostatectomy. He tolerated this procedure well and without complications. Postoperatively, he was able to be transferred to a regular hospital room following recovery from anesthesia.  He was able to begin ambulating the night of surgery. He remained hemodynamically stable overnight.  He had excellent urine output with appropriately minimal output from his pelvic drain and his pelvic drain was removed on POD #1.  He was transitioned to oral pain medication, tolerated a clear liquid diet, and had met all discharge criteria and was able to be discharged home later on POD#1.  Laboratory values:  Recent Labs    04/27/18 1620 04/28/18 0406  HGB 13.9 13.2  HCT 40.2 39.1    Disposition: Home  Discharge instruction: He was instructed to be ambulatory but to refrain from heavy lifting, strenuous activity, or driving. He was instructed on urethral catheter care.  Discharge medications:   Allergies as of 04/28/2018   No Known Allergies     Medication List    STOP taking these medications   multivitamin with minerals Tabs tablet   naproxen 500 MG tablet Commonly known as:  NAPROSYN   naproxen sodium 220 MG tablet Commonly known as:  ALEVE   VIAGRA 100 MG tablet Generic drug:  sildenafil     TAKE these medications   sulfamethoxazole-trimethoprim 800-160 MG tablet Commonly known as:  BACTRIM DS,SEPTRA DS Take 1 tablet by mouth 2 (two) times daily. Start the day prior  to foley removal appointment   traMADol 50 MG tablet Commonly known as:  ULTRAM Take 1-2 tablets (50-100 mg total) by mouth every 6 (six) hours as needed for moderate pain or severe pain.       Followup: He will followup in 1 week for catheter removal and to discuss his surgical pathology results.

## 2018-04-28 NOTE — Progress Notes (Signed)
Discharge instructions reviewed with pt and family members at bedside. Foley care and incision care was taught to pt before discharge. No questions or concerns from the family/patient at this time.

## 2018-05-08 ENCOUNTER — Telehealth: Payer: Self-pay | Admitting: Medical Oncology

## 2018-05-08 NOTE — Telephone Encounter (Signed)
Danny [name] states he is doing pretty well post robotic prostatectomy 04/27/18. He has experienced issues with constipation post surgery. We discussed taking Miralax with increased fluid intake. He has started his PT exercises for continence. He has follow up with Dr. Alinda Money 9/24 to discuss his pathology report. I asked him to call me with questions or concerns. He voiced understanding.

## 2018-05-12 DIAGNOSIS — B961 Klebsiella pneumoniae [K. pneumoniae] as the cause of diseases classified elsewhere: Secondary | ICD-10-CM | POA: Diagnosis not present

## 2018-05-12 DIAGNOSIS — N390 Urinary tract infection, site not specified: Secondary | ICD-10-CM | POA: Diagnosis not present

## 2018-05-12 DIAGNOSIS — C61 Malignant neoplasm of prostate: Secondary | ICD-10-CM | POA: Diagnosis not present

## 2018-05-25 DIAGNOSIS — M62838 Other muscle spasm: Secondary | ICD-10-CM | POA: Diagnosis not present

## 2018-05-25 DIAGNOSIS — N393 Stress incontinence (female) (male): Secondary | ICD-10-CM | POA: Diagnosis not present

## 2018-05-25 DIAGNOSIS — M6281 Muscle weakness (generalized): Secondary | ICD-10-CM | POA: Diagnosis not present

## 2018-06-09 DIAGNOSIS — M62838 Other muscle spasm: Secondary | ICD-10-CM | POA: Diagnosis not present

## 2018-06-09 DIAGNOSIS — N393 Stress incontinence (female) (male): Secondary | ICD-10-CM | POA: Diagnosis not present

## 2018-06-09 DIAGNOSIS — M6281 Muscle weakness (generalized): Secondary | ICD-10-CM | POA: Diagnosis not present

## 2018-06-24 NOTE — Telephone Encounter (Signed)
Error opening  

## 2018-07-05 ENCOUNTER — Other Ambulatory Visit: Payer: Self-pay | Admitting: Internal Medicine

## 2018-07-09 DIAGNOSIS — M6281 Muscle weakness (generalized): Secondary | ICD-10-CM | POA: Diagnosis not present

## 2018-07-09 DIAGNOSIS — M62838 Other muscle spasm: Secondary | ICD-10-CM | POA: Diagnosis not present

## 2018-07-09 DIAGNOSIS — N393 Stress incontinence (female) (male): Secondary | ICD-10-CM | POA: Diagnosis not present

## 2018-07-30 DIAGNOSIS — C61 Malignant neoplasm of prostate: Secondary | ICD-10-CM | POA: Diagnosis not present

## 2018-07-30 LAB — PSA

## 2018-08-05 ENCOUNTER — Encounter: Payer: Self-pay | Admitting: Internal Medicine

## 2018-08-05 ENCOUNTER — Ambulatory Visit: Payer: 59 | Admitting: Internal Medicine

## 2018-08-05 VITALS — BP 126/70 | HR 76 | Temp 98.6°F | Resp 16 | Ht 68.0 in | Wt 188.1 lb

## 2018-08-05 DIAGNOSIS — B349 Viral infection, unspecified: Secondary | ICD-10-CM

## 2018-08-05 DIAGNOSIS — F528 Other sexual dysfunction not due to a substance or known physiological condition: Secondary | ICD-10-CM

## 2018-08-05 MED ORDER — AMOXICILLIN 875 MG PO TABS: 875.0000 mg | ORAL_TABLET | Freq: Two times a day (BID) | ORAL | 0 refills | Status: DC

## 2018-08-05 MED ORDER — SILDENAFIL CITRATE 100 MG PO TABS: 100.0000 mg | ORAL_TABLET | Freq: Every day | ORAL | 6 refills | Status: DC | PRN

## 2018-08-05 MED ORDER — AZELASTINE HCL 0.1 % NA SOLN: 2.0000 | Freq: Every evening | NASAL | 3 refills | Status: DC | PRN

## 2018-08-05 NOTE — Patient Instructions (Signed)
Rest, fluids , tylenol  For cough:  Take Mucinex DM twice a day as needed until better  For nasal congestion: Use OTC   Flonase : 2 nasal sprays on each side of the nose in the morning until you feel better Use ASTELIN a prescribed spray : 2 nasal sprays on each side of the nose at night until you feel better    Take the antibiotic as prescribed  (Amoxicillin) only if no better in 3-4 days   Call if not gradually better over the next  10 days  Call anytime if the symptoms are severe

## 2018-08-05 NOTE — Progress Notes (Signed)
Subjective:    Patient ID: Danny [name], male    DOB: 05-30-1961, 57 y.o.   MRN: [id number]  DOS:  08/05/2018 Type of visit - description : Acute visit Symptoms started 4 to 5 days ago: Cough, clear sputum.  Some chills and shortness of breath.  + Sneezing.  Sinus and chest congestion. Taking OTC Mucinex. Today feels slightly better. ED: Urology is prescribing generic sildenafil, would like the original Viagra.  Review of Systems Had subjective fevers at first, that is better. Had some myalgias. Had mild sore throat with the onset of symptoms. No headaches.  Past Medical History:  Diagnosis Date  . Abnormal electrocardiogram   . Abnormal glucose   . Anxiety   . Cigarette smoker    former smoker- none now   . Erectile dysfunction   . GERD (gastroesophageal reflux disease)   . Paresthesia   . Prostate cancer [facility])     Past Surgical History:  Procedure Laterality Date  . LYMPHADENECTOMY Bilateral 04/27/2018   Procedure: LYMPHADENECTOMY, BILATERAL PELVIC;  Surgeon: Raynelle Bring, MD;  Location: [facility];  Service: Urology;  Laterality: Bilateral;  . NO PAST SURGERIES    . PROSTATE BIOPSY    . ROBOT ASSISTED LAPAROSCOPIC RADICAL PROSTATECTOMY N/A 04/27/2018   Procedure: XI ROBOTIC ASSISTED LAPAROSCOPIC RADICAL PROSTATECTOMY LEVEL 2;  Surgeon: Raynelle Bring, MD;  Location: [facility];  Service: Urology;  Laterality: N/A;    Social History   Socioeconomic History  . Marital status: Single    Spouse name: Not on file  . Number of children: 2  . Years of education: Not on file  . Highest education level: Not on file  Occupational History  . 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 Financial resource strain: Not on file  . Food insecurity:    Worry: Not on file    Inability: Not on file  . Transportation needs:    Medical: Not on file    Non-medical: Not on file  Tobacco Use  . Smoking status: Former Smoker    Packs/day: 0.50    Years: 10.00   Pack years: 5.00    Types: Cigarettes    Last attempt to quit: 08/20/2007    Years since quitting: 10.9  . Smokeless tobacco: Never Used  . Tobacco comment: 2009  Substance and Sexual Activity  . Alcohol use: Yes    Alcohol/week: 3.0 - 5.0 standard drinks    Types: 1 - 3 Standard drinks or equivalent, 2 Cans of beer per week    Comment: beer -weekends  . Drug use: No  . Sexual activity: Yes    Comment: with the aid of Viagra  Lifestyle  . Physical activity:    Days per week: Not on file    Minutes per session: Not on file  . Stress: Not on file  Relationships  . Social connections:    Talks on phone: Not on file    Gets together: Not on file    Attends religious service: Not on file    Active member of club or organization: Not on file    Attends meetings of clubs or organizations: Not on file    Relationship status: Not on file  . Intimate partner violence:    Fear of current or ex partner: Not on file    Emotionally abused: Not on file    Physically abused: Not on file    Forced sexual activity: Not on file  Other Topics Concern  .  Not on file  Social History Narrative   Lives by himself    Lost 1 child       Allergies as of 08/05/2018   No Known Allergies     Medication List       Accurate as of August 05, 2018  6:11 PM. Always use your most recent med list.        amoxicillin 875 MG tablet Commonly known as:  AMOXIL Take 1 tablet (875 mg total) by mouth 2 (two) times daily.   azelastine 0.1 % nasal spray Commonly known as:  ASTELIN Place 2 sprays into both nostrils at bedtime as needed for rhinitis. Use in each nostril as directed   naproxen sodium 220 MG tablet Commonly known as:  ALEVE Take 220 mg by mouth 2 (two) times daily as needed.   sildenafil 100 MG tablet Commonly known as:  VIAGRA Take 1 tablet (100 mg total) by mouth daily as needed for erectile dysfunction.   sulfamethoxazole-trimethoprim 800-160 MG tablet Commonly known as:   BACTRIM DS,SEPTRA DS Take 1 tablet by mouth 2 (two) times daily. Start the day prior to foley removal appointment   traMADol 50 MG tablet Commonly known as:  ULTRAM Take 1-2 tablets (50-100 mg total) by mouth every 6 (six) hours as needed for moderate pain or severe pain.           Objective:   Physical Exam BP 126/70 (BP Location: Left Arm, Patient Position: Sitting, Cuff Size: Normal)   Pulse 76   Temp 98.6 F (37 C) (Oral)   Resp 16   Ht 5\' 8"  (1.727 m)   Wt 188 lb 2 oz (85.3 kg)   SpO2 96%   BMI 28.60 kg/m  General:   Well developed, NAD, BMI noted. HEENT:  Normocephalic . Face symmetric, atraumatic.  Nose is slightly congested, throat symmetric not red.  Sinuses no TTP.  TMs obscured by wax. Lungs:  CTA B Normal respiratory effort, no intercostal retractions, no accessory muscle use. Heart: RRR,  no murmur.  No pretibial edema bilaterally  Skin: Not pale. Not jaundice Neurologic:  alert & oriented X3.  Speech normal, gait appropriate for age and unassisted Psych--  Cognition and judgment appear intact.  Cooperative with normal attention span and concentration.  Behavior appropriate. No anxious or depressed appearing.      Assessment & Plan:    Assessment Prediabetes MSK: Cervical spondylosis Prostate cancer: Prostatectomy 04-2018 Thyroid nodules per ultrasound 04-2015: "Follow-up by clinical exam is recommended. If patient has known risk factors for thyroid carcinoma, consider follow-up ultrasound in 12 months. If patient is clinically hyperthyroid, consider nuclear medicine thyroid uptake and scan " ED ++FH lun cancer  H/o Smoker  H/o early repol on EKG H/o anxiety   PLAN: Viral syndrome: Sx C/W viral syndrome, does not look toxic or acutely ill.  Recommend supportive treatment with fluids, Mucinex, Astelin, Flonase.  If not better, he will take amoxicillin. ED: Likes a prescription for Viagra, 100 mg, likes them better than sildenafil 20 mg,  prescription sent.

## 2018-08-05 NOTE — Assessment & Plan Note (Signed)
Viral syndrome: Sx C/W viral syndrome, does not look toxic or acutely ill.  Recommend supportive treatment with fluids, Mucinex, Astelin, Flonase.  If not better, he will take amoxicillin. ED: Likes a prescription for Viagra, 100 mg, likes them better than sildenafil 20 mg, prescription sent.

## 2018-08-05 NOTE — Progress Notes (Signed)
Pre visit review using our clinic review tool, if applicable. No additional management support is needed unless otherwise documented below in the visit note. 

## 2018-08-07 DIAGNOSIS — C61 Malignant neoplasm of prostate: Secondary | ICD-10-CM | POA: Diagnosis not present

## 2018-08-07 DIAGNOSIS — N393 Stress incontinence (female) (male): Secondary | ICD-10-CM | POA: Diagnosis not present

## 2018-08-17 ENCOUNTER — Encounter: Payer: Self-pay | Admitting: Internal Medicine

## 2018-10-28 DIAGNOSIS — C61 Malignant neoplasm of prostate: Secondary | ICD-10-CM | POA: Diagnosis not present

## 2018-10-28 LAB — PSA: PSA: 0.016

## 2018-11-04 DIAGNOSIS — C61 Malignant neoplasm of prostate: Secondary | ICD-10-CM | POA: Diagnosis not present

## 2018-11-04 DIAGNOSIS — N393 Stress incontinence (female) (male): Secondary | ICD-10-CM | POA: Diagnosis not present

## 2018-11-13 ENCOUNTER — Encounter: Payer: 59 | Admitting: Internal Medicine

## 2019-02-05 LAB — PSA: PSA: 0.017

## 2019-02-11 ENCOUNTER — Encounter: Payer: Self-pay | Admitting: Internal Medicine

## 2019-02-11 ENCOUNTER — Other Ambulatory Visit: Payer: Self-pay

## 2019-02-11 ENCOUNTER — Ambulatory Visit (INDEPENDENT_AMBULATORY_CARE_PROVIDER_SITE_OTHER): Payer: 59 | Admitting: Internal Medicine

## 2019-02-11 VITALS — BP 116/82 | HR 61 | Temp 98.3°F | Resp 16 | Ht 68.0 in | Wt 181.0 lb

## 2019-02-11 DIAGNOSIS — K5900 Constipation, unspecified: Secondary | ICD-10-CM

## 2019-02-11 DIAGNOSIS — F528 Other sexual dysfunction not due to a substance or known physiological condition: Secondary | ICD-10-CM

## 2019-02-11 DIAGNOSIS — C61 Malignant neoplasm of prostate: Secondary | ICD-10-CM

## 2019-02-11 DIAGNOSIS — Z0000 Encounter for general adult medical examination without abnormal findings: Secondary | ICD-10-CM

## 2019-02-11 DIAGNOSIS — E041 Nontoxic single thyroid nodule: Secondary | ICD-10-CM | POA: Diagnosis not present

## 2019-02-11 DIAGNOSIS — R739 Hyperglycemia, unspecified: Secondary | ICD-10-CM

## 2019-02-11 LAB — COMPREHENSIVE METABOLIC PANEL
ALT: 11 U/L (ref 0–53)
AST: 12 U/L (ref 0–37)
Albumin: 4.1 g/dL (ref 3.5–5.2)
Alkaline Phosphatase: 56 U/L (ref 39–117)
BUN: 19 mg/dL (ref 6–23)
CO2: 27 mEq/L (ref 19–32)
Calcium: 8.9 mg/dL (ref 8.4–10.5)
Chloride: 106 mEq/L (ref 96–112)
Creatinine, Ser: 0.82 mg/dL (ref 0.40–1.50)
GFR: 117.81 mL/min (ref >60.00)
Glucose, Bld: 81 mg/dL (ref 70–99)
Potassium: 3.9 mEq/L (ref 3.5–5.1)
Sodium: 141 mEq/L (ref 135–145)
Total Bilirubin: 0.7 mg/dL (ref 0.2–1.2)
Total Protein: 6.6 g/dL (ref 6.0–8.3)

## 2019-02-11 LAB — CBC WITH DIFFERENTIAL/PLATELET
Basophils Absolute: 0 10*3/uL (ref 0.0–0.1)
Basophils Relative: 0.7 % (ref 0.0–3.0)
Eosinophils Absolute: 0.1 10*3/uL (ref 0.0–0.7)
Eosinophils Relative: 3.1 % (ref 0.0–5.0)
HCT: 42.3 % (ref 39.0–52.0)
Hemoglobin: 14.8 g/dL (ref 13.0–17.0)
Lymphocytes Relative: 31.2 % (ref 12.0–46.0)
Lymphs Abs: 1.5 10*3/uL (ref 0.7–4.0)
MCHC: 34.9 g/dL (ref 30.0–36.0)
MCV: 90.1 fl (ref 78.0–100.0)
Monocytes Absolute: 0.8 10*3/uL (ref 0.1–1.0)
Monocytes Relative: 17.4 % — ABNORMAL HIGH (ref 3.0–12.0)
Neutro Abs: 2.3 10*3/uL (ref 1.4–7.7)
Neutrophils Relative %: 47.6 % (ref 43.0–77.0)
Platelets: 297 10*3/uL (ref 150.0–400.0)
RBC: 4.69 Mil/uL (ref 4.22–5.81)
RDW: 13.3 % (ref 11.5–15.5)
WBC: 4.9 10*3/uL (ref 4.0–10.5)

## 2019-02-11 LAB — LIPID PANEL
Cholesterol: 159 mg/dL (ref 0–200)
HDL: 38.3 mg/dL — ABNORMAL LOW (ref >39.00)
LDL Cholesterol: 108 mg/dL — ABNORMAL HIGH (ref 0–99)
NonHDL: 120.32
Total CHOL/HDL Ratio: 4
Triglycerides: 60 mg/dL (ref 0.0–149.0)
VLDL: 12 mg/dL (ref 0.0–40.0)

## 2019-02-11 LAB — HEMOGLOBIN A1C: Hgb A1c MFr Bld: 6.2 % (ref 4.6–6.5)

## 2019-02-11 LAB — TSH: TSH: 1.08 u[IU]/mL (ref 0.35–4.50)

## 2019-02-11 MED ORDER — NAPROXEN SODIUM 220 MG PO TABS: 220.0000 mg | ORAL_TABLET | Freq: Two times a day (BID) | ORAL | 4 refills | Status: AC | PRN

## 2019-02-11 NOTE — Progress Notes (Signed)
Pre visit review using our clinic review tool, if applicable. No additional management support is needed unless otherwise documented below in the visit note. 

## 2019-02-11 NOTE — Patient Instructions (Signed)
Get the blood work     [place] Schedule your next appointment for a check up in 4 months   Get a flu shot early this season  Consider immunization to prevent shingles it is called Shingrix  For constipation: Take MiraLAX daily Start Metamucil 1 or 2 capsules every morning with fluids Increase your intake of fruits and salads  Will schedule ultrasound of your thyroid

## 2019-02-11 NOTE — Assessment & Plan Note (Addendum)
--  Last Td -2015 ; rec a flu shot, discussed shingrix --Prostate cancer : per urology --Colon cancer screening Cscope 04-2016, follow-up per GI --Labs: CMP, FLP, CBC, A1c, TSH --Diet and exercise discussed.

## 2019-02-11 NOTE — Progress Notes (Signed)
Subjective:    Patient ID: Danny [name], male    DOB: November 25, 1960, 58 y.o.   MRN: [id number]  DOS:  02/11/2019 Type of visit - description: cpx Several concerns Prostate cancer, overall doing okay, he continue with ED. Back pain occasionally, wonders if it is okay to take naproxen Also few weeks ago his girlfriend informed him that she had a genital herpes outbreak, the patient remains asymptomatic but is concerned about this diagnosis Continue constipation, used to take MiraLAX but is not doing so at this point. Denies blood in the stools.  No nausea, vomiting, diarrhea.   Review of Systems See above Specifically denies anxiety or depression, managing stress well.  Other than above, a 14 point review of systems is negative    Past Medical History:  Diagnosis Date  . Abnormal electrocardiogram   . Abnormal glucose   . Anxiety   . Cigarette smoker    former smoker- none now   . Erectile dysfunction   . GERD (gastroesophageal reflux disease)   . Paresthesia   . Prostate cancer [facility])     Past Surgical History:  Procedure Laterality Date  . LYMPHADENECTOMY Bilateral 04/27/2018   Procedure: LYMPHADENECTOMY, BILATERAL PELVIC;  Surgeon: Raynelle Bring, MD;  Location: [facility];  Service: Urology;  Laterality: Bilateral;  . NO PAST SURGERIES    . PROSTATE BIOPSY    . ROBOT ASSISTED LAPAROSCOPIC RADICAL PROSTATECTOMY N/A 04/27/2018   Procedure: XI ROBOTIC ASSISTED LAPAROSCOPIC RADICAL PROSTATECTOMY LEVEL 2;  Surgeon: Raynelle Bring, MD;  Location: [facility];  Service: Urology;  Laterality: N/A;    Social History   Socioeconomic History  . Marital status: Single    Spouse name: Not on file  . Number of children: 2  . Years of education: Not on file  . Highest education level: Not on file  Occupational History  . 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 Financial resource strain: Not on file  . Food insecurity    Worry: Not on file    Inability: Not on  file  . Transportation needs    Medical: Not on file    Non-medical: Not on file  Tobacco Use  . Smoking status: Former Smoker    Packs/day: 0.50    Years: 10.00    Pack years: 5.00    Types: Cigarettes    Quit date: 08/20/2007    Years since quitting: 11.4  . Smokeless tobacco: Never Used  . Tobacco comment: 2009  Substance and Sexual Activity  . Alcohol use: Yes    Alcohol/week: 3.0 - 5.0 standard drinks    Types: 2 Cans of beer, 1 - 3 Standard drinks or equivalent per week    Comment: beer -weekends  . Drug use: No  . Sexual activity: Yes    Comment: with the aid of Viagra  Lifestyle  . Physical activity    Days per week: Not on file    Minutes per session: Not on file  . Stress: Not on file  Relationships  . Social Herbalist on phone: Not on file    Gets together: Not on file    Attends religious service: Not on file    Active member of club or organization: Not on file    Attends meetings of clubs or organizations: Not on file    Relationship status: Not on file  . Intimate partner violence    Fear of current or ex partner: Not on file  Emotionally abused: Not on file    Physically abused: Not on file    Forced sexual activity: Not on file  Other Topics Concern  . Not on file  Social History Narrative   Lives by himself    Lost 1 child      Family History  Problem Relation Age of Onset  . Lung cancer Father        age 34, smoker  . Leukemia Brother   . Prostate cancer Other        2 uncles and grandfather  . Colon cancer Other        GM, in her 23s  . Colon cancer Paternal Grandmother   . Lung cancer Paternal Grandfather   . Prostate cancer Paternal Grandfather   . Lung cancer Other        2 great-uncles   . Lung cancer Paternal Uncle   . Prostate cancer Paternal Uncle   . Prostate cancer Maternal Uncle   . Prostate cancer Maternal Uncle   . CAD Neg Hx   . Stroke Neg Hx   . Diabetes Neg Hx   . Colon polyps Neg Hx     Allergies as  of 02/11/2019   No Known Allergies     Medication List       Accurate as of February 11, 2019 11:59 PM. If you have any questions, ask your nurse or doctor.        STOP taking these medications   amoxicillin 875 MG tablet Commonly known as: AMOXIL Stopped by: Kathlene November, MD   sildenafil 100 MG tablet Commonly known as: Viagra Stopped by: Kathlene November, MD   sulfamethoxazole-trimethoprim 800-160 MG tablet Commonly known as: BACTRIM DS Stopped by: Kathlene November, MD   traMADol 50 MG tablet Commonly known as: Ultram Stopped by: Kathlene November, MD     TAKE these medications   azelastine 0.1 % nasal spray Commonly known as: ASTELIN Place 2 sprays into both nostrils at bedtime as needed for rhinitis. Use in each nostril as directed   naproxen sodium 220 MG tablet Commonly known as: ALEVE Take 1 tablet (220 mg total) by mouth 2 (two) times daily as needed.           Objective:   Physical Exam BP 116/82 (BP Location: Left Arm, Patient Position: Sitting, Cuff Size: Small)   Pulse 61   Temp 98.3 F (36.8 C) (Oral)   Resp 16   Ht 5\' 8"  (1.727 m)   Wt 181 lb (82.1 kg)   SpO2 99%   BMI 27.52 kg/m  General: Well developed, NAD, BMI noted Neck: No  thyromegaly  HEENT:  Normocephalic . Face symmetric, atraumatic Neck: Thyroid slightly enlarged but not tender.  Most of the enlargement is on the R   Lungs:  CTA B Normal respiratory effort, no intercostal retractions, no accessory muscle use. Heart: RRR,  no murmur.  No pretibial edema bilaterally  Abdomen:  Not distended, soft, non-tender. No rebound or rigidity.   Skin: Exposed areas without rash. Not pale. Not jaundice Neurologic:  alert & oriented X3.  Speech normal, gait appropriate for age and unassisted Strength symmetric and appropriate for age.  Psych: Cognition and judgment appear intact.  Cooperative with normal attention span and concentration.  Behavior appropriate. No anxious or depressed appearing.     Assessment     ASSESSMENT Prediabetes MSK: Cervical spondylosis Prostate cancer: Prostatectomy 04-2018 Thyroid nodules per ultrasound 04-2015: "Follow-up by clinical exam is recommended. If patient  has known risk factors for thyroid carcinoma, consider follow-up ultrasound in 12 months. If patient is clinically hyperthyroid, consider nuclear medicine thyroid uptake and scan " ED ++FH lun cancer  H/o Smoker  H/o early repol on EKG H/o anxiety   PLAN: Here for CPX Prediabetes: Check A1c MSK: He has episodic back pain, recommend stretching -which he already knows how to do-  and judicious use of naproxen with food. Prostate cancer: Managed by urology ED: Not responding to Viagra for local injections, managed by urology Family history of lung cancer: Declined low-dose CT for lung cancer screening   Thyromegaly: Previous referrals for follow-up ultrasound failed, will try again today Genital herpes: Girlfriend has a history of genital herpes, explained the patient what it is, the potential to be contagious even she is asymptomatic, recommend use of condoms consistently. Constipation: Not a new issue, it used to respond to MiraLAX.  Recommend MiraLAX daily, lots of fluids, fruits/salads, Metamucil supplements.  See AVS.  Reassess at the next visit RTC 4 months  Today, in addition to the physical exam, I spent more than 20 minutes counseling/addressing other issues including back pain, he is family history of lung cancer, thyromegaly, recent exposure to genital herpes and constipation management.

## 2019-02-12 NOTE — Assessment & Plan Note (Signed)
Here for CPX Prediabetes: Check A1c MSK: He has episodic back pain, recommend stretching -which he already knows how to do-  and judicious use of naproxen with food. Prostate cancer: Managed by urology ED: Not responding to Viagra for local injections, managed by urology Family history of lung cancer: Declined low-dose CT for lung cancer screening   Thyromegaly: Previous referrals for follow-up ultrasound failed, will try again today Genital herpes: Girlfriend has a history of genital herpes, explained the patient what it is, the potential to be contagious even she is asymptomatic, recommend use of condoms consistently. Constipation: Not a new issue, it used to respond to MiraLAX.  Recommend MiraLAX daily, lots of fluids, fruits/salads, Metamucil supplements.  See AVS.  Reassess at the next visit RTC 4 months

## 2019-03-29 ENCOUNTER — Encounter: Payer: Self-pay | Admitting: Gastroenterology

## 2019-05-07 LAB — PSA: PSA: 0.022

## 2019-05-20 ENCOUNTER — Encounter: Payer: Self-pay | Admitting: Internal Medicine

## 2019-06-14 ENCOUNTER — Ambulatory Visit: Payer: 59 | Admitting: Internal Medicine

## 2020-05-23 LAB — PSA: PSA: 0.043

## 2020-06-12 ENCOUNTER — Encounter: Payer: Self-pay | Admitting: Internal Medicine

## 2020-10-26 ENCOUNTER — Encounter: Payer: Self-pay | Admitting: Internal Medicine

## 2020-11-14 ENCOUNTER — Encounter: Payer: Self-pay | Admitting: Internal Medicine

## 2020-11-14 ENCOUNTER — Other Ambulatory Visit: Payer: Self-pay

## 2020-11-14 ENCOUNTER — Ambulatory Visit: Payer: 59 | Admitting: Internal Medicine

## 2020-11-14 VITALS — BP 156/92 | HR 60 | Temp 97.6°F | Ht 68.0 in | Wt 190.0 lb

## 2020-11-14 DIAGNOSIS — H6123 Impacted cerumen, bilateral: Secondary | ICD-10-CM | POA: Diagnosis not present

## 2020-11-14 NOTE — Progress Notes (Signed)
Subjective:    Patient ID: Danny [name], male    DOB: 06-29-1961, 60 y.o.   MRN: [id number]  DOS:  11/14/2020 Type of visit - description: Acute 4 days history of not hearing out of the right ear, ear feels stopped up. He tried OTC cerumen medication without much help.  Denies any ear pain or discharge. No fever, chills, runny nose or sore throat.  BP noted to be elevated, was normal 3 weeks ago.    Review of Systems See above   Past Medical History:  Diagnosis Date  . Abnormal electrocardiogram   . Abnormal glucose   . Anxiety   . Cigarette smoker    former smoker- none now   . Erectile dysfunction   . GERD (gastroesophageal reflux disease)   . Paresthesia   . Prostate cancer [facility])     Past Surgical History:  Procedure Laterality Date  . LYMPHADENECTOMY Bilateral 04/27/2018   Procedure: LYMPHADENECTOMY, BILATERAL PELVIC;  Surgeon: Raynelle Bring, MD;  Location: [facility];  Service: Urology;  Laterality: Bilateral;  . NO PAST SURGERIES    . PROSTATE BIOPSY    . ROBOT ASSISTED LAPAROSCOPIC RADICAL PROSTATECTOMY N/A 04/27/2018   Procedure: XI ROBOTIC ASSISTED LAPAROSCOPIC RADICAL PROSTATECTOMY LEVEL 2;  Surgeon: Raynelle Bring, MD;  Location: [facility];  Service: Urology;  Laterality: N/A;    Allergies as of 11/14/2020   No Known Allergies     Medication List       Accurate as of November 14, 2020 11:30 AM. If you have any questions, ask your nurse or doctor.        azelastine 0.1 % nasal spray Commonly known as: ASTELIN Place 2 sprays into both nostrils at bedtime as needed for rhinitis. Use in each nostril as directed   naproxen sodium 220 MG tablet Commonly known as: ALEVE Take 1 tablet (220 mg total) by mouth 2 (two) times daily as needed.          Objective:   Physical Exam BP (!) 156/92 (BP Location: Right Arm, Patient Position: Sitting, Cuff Size: Large)   Pulse 60   Temp 97.6 F (36.4 C) (Temporal)   Ht 5\' 8"  (1.727 m)   Wt 190 lb (86.2 kg)   SpO2  99%   BMI 28.89 kg/m  General:   Well developed, NAD, BMI noted. HEENT:  Normocephalic . Face symmetric, atraumatic B cerumen impaction noted, attempted to remove wax from the right ear with a spoon  Lower extremities: no pretibial edema bilaterally  Skin: Not pale. Not jaundice Neurologic:  alert & oriented X3.  Speech normal, gait appropriate for age and unassisted Psych--  Cognition and judgment appear intact.  Cooperative with normal attention span and concentration.  Behavior appropriate. No anxious or depressed appearing.      Assessment      ASSESSMENT Prediabetes MSK: Cervical spondylosis Prostate cancer: Prostatectomy 04-2018 Thyroid nodules per ultrasound 04-2015: "Follow-up by clinical exam is recommended. If patient has known risk factors for thyroid carcinoma, consider follow-up ultrasound in 12 months. If patient is clinically hyperthyroid, consider nuclear medicine thyroid uptake and scan " ED ++FH lun cancer  H/o Smoker  H/o early repol on EKG H/o anxiety   PLAN: Cerumen impaction B: Procedure note: In a standard fashion, with the patient verbal concern, we were able to flush all cerumen from the right ear.  The patient did not get dizzy and tolerated well.  Exam after the procedure show a normal canal and TM. Unable to  do the same on the left side. Plan: OTC drops left ear, he will come back for CPX next week, lavage of the left ear if needed at the time if needed.   This visit occurred during the SARS-CoV-2 public health emergency.  Safety protocols were in place, including screening questions prior to the visit, additional usage of staff PPE, and extensive cleaning of exam room while observing appropriate contact time as indicated for disinfecting solutions.

## 2020-11-14 NOTE — Patient Instructions (Signed)
Please schedule a physical exam at your earliest convenience.

## 2020-11-15 NOTE — Assessment & Plan Note (Signed)
Cerumen impaction B: Procedure note: In a standard fashion, with the patient verbal concern, we were able to flush all cerumen from the right ear.  The patient did not get dizzy and tolerated well.  Exam after the procedure show a normal canal and TM. Unable to do the same on the left side. Plan: OTC drops left ear, he will come back for CPX next week, lavage of the left ear if needed at the time if needed.

## 2020-11-22 ENCOUNTER — Other Ambulatory Visit: Payer: Self-pay

## 2020-11-22 ENCOUNTER — Ambulatory Visit (INDEPENDENT_AMBULATORY_CARE_PROVIDER_SITE_OTHER): Payer: 59 | Admitting: Internal Medicine

## 2020-11-22 ENCOUNTER — Encounter: Payer: Self-pay | Admitting: Internal Medicine

## 2020-11-22 VITALS — BP 143/91 | HR 71 | Temp 98.0°F | Ht 68.0 in | Wt 191.0 lb

## 2020-11-22 DIAGNOSIS — M545 Low back pain, unspecified: Secondary | ICD-10-CM

## 2020-11-22 DIAGNOSIS — Z87891 Personal history of nicotine dependence: Secondary | ICD-10-CM

## 2020-11-22 DIAGNOSIS — R030 Elevated blood-pressure reading, without diagnosis of hypertension: Secondary | ICD-10-CM

## 2020-11-22 DIAGNOSIS — Z0001 Encounter for general adult medical examination with abnormal findings: Secondary | ICD-10-CM

## 2020-11-22 DIAGNOSIS — Z0000 Encounter for general adult medical examination without abnormal findings: Secondary | ICD-10-CM

## 2020-11-22 DIAGNOSIS — E042 Nontoxic multinodular goiter: Secondary | ICD-10-CM

## 2020-11-22 DIAGNOSIS — R739 Hyperglycemia, unspecified: Secondary | ICD-10-CM | POA: Diagnosis not present

## 2020-11-22 DIAGNOSIS — Z122 Encounter for screening for malignant neoplasm of respiratory organs: Secondary | ICD-10-CM

## 2020-11-22 DIAGNOSIS — Z801 Family history of malignant neoplasm of trachea, bronchus and lung: Secondary | ICD-10-CM | POA: Diagnosis not present

## 2020-11-22 DIAGNOSIS — K219 Gastro-esophageal reflux disease without esophagitis: Secondary | ICD-10-CM

## 2020-11-22 MED ORDER — PANTOPRAZOLE SODIUM 40 MG PO TBEC: 40.0000 mg | DELAYED_RELEASE_TABLET | Freq: Every day | ORAL | 0 refills | Status: DC

## 2020-11-22 NOTE — Progress Notes (Signed)
Subjective:    Patient ID: Danny [name], male    DOB: February 04, 1961, 60 y.o.   MRN: [id number]  DOS:  11/22/2020 Type of visit - description: CPX Here for CPX Multiple other issues discussed  Heartburn, he had it on and off for a while but in the last 6 months sxs have definitely increase. Described as a burning sensation from the upper stomach to the chest. No nausea, vomiting.  No diarrhea or blood in the stools.  No  odynophagia. No taking any medication.  Low back pain: This is a ongoing problem for years, every day he has to take ibuprofen or Tylenol. No radiation to the buttocks or the legs. No paresthesias.  BP remains elevated, no ambulatory BPs.   BP Readings from Last 3 Encounters:  11/22/20 (!) 143/91  11/14/20 (!) 156/92  02/11/19 116/82     Review of Systems  Other than above, a 14 point review of systems is negative     Past Medical History:  Diagnosis Date  . Abnormal electrocardiogram   . Abnormal glucose   . Anxiety   . Cigarette smoker    former smoker- none now   . Erectile dysfunction   . GERD (gastroesophageal reflux disease)   . Paresthesia   . Prostate cancer [facility])     Past Surgical History:  Procedure Laterality Date  . LYMPHADENECTOMY Bilateral 04/27/2018   Procedure: LYMPHADENECTOMY, BILATERAL PELVIC;  Surgeon: Raynelle Bring, MD;  Location: [facility];  Service: Urology;  Laterality: Bilateral;  . NO PAST SURGERIES    . PROSTATE BIOPSY    . ROBOT ASSISTED LAPAROSCOPIC RADICAL PROSTATECTOMY N/A 04/27/2018   Procedure: XI ROBOTIC ASSISTED LAPAROSCOPIC RADICAL PROSTATECTOMY LEVEL 2;  Surgeon: Raynelle Bring, MD;  Location: [facility];  Service: Urology;  Laterality: N/A;    Allergies as of 11/22/2020   No Known Allergies     Medication List       Accurate as of November 22, 2020 11:59 PM. If you have any questions, ask your nurse or doctor.        azelastine 0.1 % nasal spray Commonly known as: ASTELIN Place 2 sprays into both nostrils at  bedtime as needed for rhinitis. Use in each nostril as directed   naproxen sodium 220 MG tablet Commonly known as: ALEVE Take 1 tablet (220 mg total) by mouth 2 (two) times daily as needed.   pantoprazole 40 MG tablet Commonly known as: PROTONIX Take 1 tablet (40 mg total) by mouth daily. Started by: Kathlene November, MD          Objective:   Physical Exam BP (!) 143/91 (BP Location: Left Arm, Patient Position: Sitting, Cuff Size: Large)   Pulse 71   Temp 98 F (36.7 C) (Temporal)   Ht 5\' 8"  (1.727 m)   Wt 191 lb (86.6 kg)   SpO2 98%   BMI 29.04 kg/m  General: Well developed, NAD, BMI noted Neck: No  thyromegaly  HEENT:  Normocephalic . Face symmetric, atraumatic Lungs:  CTA B Normal respiratory effort, no intercostal retractions, no accessory muscle use. Heart: RRR,  no murmur.  Abdomen:  Not distended, soft, non-tender. No rebound or rigidity.   Lower extremities: no pretibial edema bilaterally  Skin: Exposed areas without rash. Not pale. Not jaundice Neurologic:  alert & oriented X3.  Speech normal, gait appropriate for age and unassisted Strength symmetric and appropriate for age.  Psych: Cognition and judgment appear intact.  Cooperative with normal attention span and concentration.  Behavior appropriate. No anxious or depressed appearing.     Assessment      ASSESSMENT Prediabetes MSK: Cervical spondylosis Prostate cancer: Prostatectomy 04-2018 Thyroid nodules per ultrasound 04-2015: "Follow-up by clinical exam is recommended. If patient has known risk factors for thyroid carcinoma, consider follow-up ultrasound in 12 months. If patient is clinically hyperthyroid, consider nuclear medicine thyroid uptake and scan " ED ++FH lun cancer  H/o Smoker  H/o early repol on EKG H/o anxiety   PLAN: Here for CPX Prediabetes: Admits diet is not healthy, encouraged to eat healthier and increase his physical activity Elevated BP: New issue, rec a low-salt diet, we  talked about medication but at the end we agreed to come back in 4 months and reassess then.  Recommend to check ambulatory BPs. Thyroid nodules per ultrasound few years ago: Physical exam is normal. Low back pain: New to me, as described above, going on for a while, we agreed on do a x-ray (history of prostate cancer). PT referral: Declined, no time.  Recommend self PT, see AVS. Minimize the use of NSAIDs, try Tylenol instead. GERD: New issue, as described above, rec pantoprazole for 1 month, if symptoms do not get better or they resurface, refer to GI for EGD?. RTC 4 months   In addition to CPX we address his chronic medical problems. We also discussed 3  new problems: Low back pain, GERD, elevated BP.   This visit occurred during the SARS-CoV-2 public health emergency.  Safety protocols were in place, including screening questions prior to the visit, additional usage of staff PPE, and extensive cleaning of exam room while observing appropriate contact time as indicated for disinfecting solutions.

## 2020-11-22 NOTE — Patient Instructions (Addendum)
Watch her diet closely, try to eat low-salt.  Check the  blood pressure 2 or 3 times a month   BP GOAL is between 110/65 and  135/85. If it is consistently higher or lower, let me know  We are referring you to the pulmonary clinic to schedule a CAT scan of your chest to screen you for lung cancer  For back pain: Go to the [facility] website, they have good information about self physical therapy, stretching. Please use more Tylenol that ibuprofen for pain. If you decide to take ibuprofen, take it with food to prevent gastritis and ulcers.  Acid reflux: Start pantoprazole, 1 tablet before breakfast for 1 month.  Take it on an empty stomach. If you are not better or the acid reflux comes back:  let me know.   GO TO THE LAB : Get the blood work     [place], [place] Come back for a checkup in 4 months  STOP BY THE FIRST FLOOR:  get the XR

## 2020-11-23 ENCOUNTER — Encounter: Payer: Self-pay | Admitting: Internal Medicine

## 2020-11-23 LAB — CBC WITH DIFFERENTIAL/PLATELET
Basophils Absolute: 0 10*3/uL (ref 0.0–0.1)
Basophils Relative: 1.2 % (ref 0.0–3.0)
Eosinophils Absolute: 0.1 10*3/uL (ref 0.0–0.7)
Eosinophils Relative: 3.4 % (ref 0.0–5.0)
HCT: 41.6 % (ref 39.0–52.0)
Hemoglobin: 14.4 g/dL (ref 13.0–17.0)
Lymphocytes Relative: 33.3 % (ref 12.0–46.0)
Lymphs Abs: 1.3 10*3/uL (ref 0.7–4.0)
MCHC: 34.6 g/dL (ref 30.0–36.0)
MCV: 89.8 fl (ref 78.0–100.0)
Monocytes Absolute: 0.6 10*3/uL (ref 0.1–1.0)
Monocytes Relative: 15.5 % — ABNORMAL HIGH (ref 3.0–12.0)
Neutro Abs: 1.8 10*3/uL (ref 1.4–7.7)
Neutrophils Relative %: 46.6 % (ref 43.0–77.0)
Platelets: 301 10*3/uL (ref 150.0–400.0)
RBC: 4.64 Mil/uL (ref 4.22–5.81)
RDW: 13.3 % (ref 11.5–15.5)
WBC: 3.8 10*3/uL — ABNORMAL LOW (ref 4.0–10.5)

## 2020-11-23 LAB — COMPREHENSIVE METABOLIC PANEL
ALT: 20 U/L (ref 0–53)
AST: 15 U/L (ref 0–37)
Albumin: 4 g/dL (ref 3.5–5.2)
Alkaline Phosphatase: 63 U/L (ref 39–117)
BUN: 13 mg/dL (ref 6–23)
CO2: 31 mEq/L (ref 19–32)
Calcium: 9.2 mg/dL (ref 8.4–10.5)
Chloride: 105 mEq/L (ref 96–112)
Creatinine, Ser: 0.99 mg/dL (ref 0.40–1.50)
GFR: 84.69 mL/min (ref >60.00)
Glucose, Bld: 100 mg/dL — ABNORMAL HIGH (ref 70–99)
Potassium: 4.3 mEq/L (ref 3.5–5.1)
Sodium: 141 mEq/L (ref 135–145)
Total Bilirubin: 1.3 mg/dL — ABNORMAL HIGH (ref 0.2–1.2)
Total Protein: 6.5 g/dL (ref 6.0–8.3)

## 2020-11-23 LAB — LIPID PANEL
Cholesterol: 150 mg/dL (ref 0–200)
HDL: 38.4 mg/dL — ABNORMAL LOW (ref >39.00)
LDL Cholesterol: 101 mg/dL — ABNORMAL HIGH (ref 0–99)
NonHDL: 111.69
Total CHOL/HDL Ratio: 4
Triglycerides: 51 mg/dL (ref 0.0–149.0)
VLDL: 10.2 mg/dL (ref 0.0–40.0)

## 2020-11-23 LAB — HEMOGLOBIN A1C: Hgb A1c MFr Bld: 6.3 % (ref 4.6–6.5)

## 2020-11-23 NOTE — Assessment & Plan Note (Signed)
--  Td -2015 -COVID VAX x3 -shingrix: will wait -- h/o  Prostate cancer : per urology --Colon cancer screening Cscope 04-2016, colonoscopy was due in 2020, letter from GI reprinted.  Encouraged to call. --lung cancer screening: Patient is a former smoker, his father had lung cancer, 2 other family members had it as well.  Request a screening, refer to low-dose CT.  If he does not qualify by Medicare criteria, he is willing to pay out-of-pocket. --Labs:  CMP, FLP, CBC, A1c --Diet and exercise discussed.

## 2020-11-23 NOTE — Assessment & Plan Note (Signed)
Prediabetes: Admits diet is not healthy, encouraged to eat healthier and increase his physical activity Elevated BP: New issue, rec a low-salt diet, we talked about medication but at the end we agreed to come back in 4 months and reassess then.  Recommend to check ambulatory BPs. Thyroid nodules per ultrasound few years ago: Physical exam is normal. Low back pain: New to me, as described above, going on for a while, we agreed on do a x-ray (history of prostate cancer). PT referral: Declined, no time.  Recommend self PT, see AVS. Minimize the use of NSAIDs, try Tylenol instead. GERD: New issue, as described above, rec pantoprazole for 1 month, if symptoms do not get better or they resurface, refer to GI for EGD?. RTC 4 months

## 2020-12-16 ENCOUNTER — Other Ambulatory Visit: Payer: Self-pay | Admitting: Internal Medicine

## 2020-12-26 LAB — PSA: PSA: 0.084

## 2021-01-18 ENCOUNTER — Encounter: Payer: Self-pay | Admitting: Internal Medicine

## 2021-03-26 ENCOUNTER — Encounter: Payer: Self-pay | Admitting: Internal Medicine

## 2021-03-26 ENCOUNTER — Ambulatory Visit (HOSPITAL_BASED_OUTPATIENT_CLINIC_OR_DEPARTMENT_OTHER)
Admission: RE | Admit: 2021-03-26 | Discharge: 2021-03-26 | Disposition: A | Discharge status: Home / Self Care | Payer: 59 | Source: Ambulatory Visit | Attending: Internal Medicine | Admitting: Internal Medicine

## 2021-03-26 ENCOUNTER — Ambulatory Visit: Payer: 59 | Admitting: Internal Medicine

## 2021-03-26 ENCOUNTER — Other Ambulatory Visit: Payer: Self-pay

## 2021-03-26 VITALS — BP 136/94 | HR 70 | Temp 98.4°F | Resp 16 | Ht 68.0 in | Wt 189.5 lb

## 2021-03-26 DIAGNOSIS — R0789 Other chest pain: Secondary | ICD-10-CM | POA: Diagnosis not present

## 2021-03-26 DIAGNOSIS — R030 Elevated blood-pressure reading, without diagnosis of hypertension: Secondary | ICD-10-CM

## 2021-03-26 DIAGNOSIS — M25519 Pain in unspecified shoulder: Secondary | ICD-10-CM | POA: Diagnosis not present

## 2021-03-26 MED ORDER — PREDNISONE 10 MG PO TABS: ORAL_TABLET | ORAL | 0 refills | Status: DC

## 2021-03-26 NOTE — Progress Notes (Signed)
Subjective:    Patient ID: Danny [name], male    DOB: Dec 13, 1960, 60 y.o.   MRN: [id number]  DOS:  03/26/2021 Type of visit - description: Acute  Symptoms a started 2 weeks ago: Reports pain at the left shoulder, left upper chest, inner aspect of the left arm and left upper back. Apparently the most affected area is the inner aspect of the left arm. The pain decreases with ibuprofen temporarily.  Otherwise pain is a steady. No injury, no fall. Has not seen any rash Pain is not described as burning.   Review of Systems No fever chills No substernal chest pain No palpitations or difficulty breathing No cough  Past Medical History:  Diagnosis Date   Abnormal electrocardiogram    Abnormal glucose    Anxiety    Cigarette smoker    former smoker- none now    Erectile dysfunction    GERD (gastroesophageal reflux disease)    Paresthesia    Prostate cancer [facility]     Past Surgical History:  Procedure Laterality Date   LYMPHADENECTOMY Bilateral 04/27/2018   Procedure: LYMPHADENECTOMY, BILATERAL PELVIC;  Surgeon: Raynelle Bring, MD;  Location: [facility];  Service: Urology;  Laterality: Bilateral;   NO PAST SURGERIES     PROSTATE BIOPSY     ROBOT ASSISTED LAPAROSCOPIC RADICAL PROSTATECTOMY N/A 04/27/2018   Procedure: XI ROBOTIC ASSISTED LAPAROSCOPIC RADICAL PROSTATECTOMY LEVEL 2;  Surgeon: Raynelle Bring, MD;  Location: [facility];  Service: Urology;  Laterality: N/A;    Allergies as of 03/26/2021   No Known Allergies      Medication List        Accurate as of March 26, 2021  1:05 PM. If you have any questions, ask your nurse or doctor.          Advil 200 MG Caps Generic drug: Ibuprofen Take 200-400 mg by mouth every 6 (six) hours as needed.   azelastine 0.1 % nasal spray Commonly known as: ASTELIN Place 2 sprays into both nostrils at bedtime as needed for rhinitis. Use in each nostril as directed   naproxen sodium 220 MG tablet Commonly known as: ALEVE Take 1 tablet  (220 mg total) by mouth 2 (two) times daily as needed.   pantoprazole 40 MG tablet Commonly known as: PROTONIX Take 1 tablet (40 mg total) by mouth daily.           Objective:   Physical Exam BP (!) 136/94 (BP Location: Left Arm, Patient Position: Sitting, Cuff Size: Small)   Pulse 70   Temp 98.4 F (36.9 C) (Oral)   Resp 16   Ht '5\' 8"'$  (1.727 m)   Wt 189 lb 8 oz (86 kg)   SpO2 98%   BMI 28.81 kg/m  General:   Well developed, NAD, BMI noted. HEENT:  Normocephalic . Face symmetric, atraumatic Lungs:  CTA B Normal respiratory effort, no intercostal retractions, no accessory muscle use. Heart: RRR,  no murmur. MSK: No TTP at the cervical or thoracic spine. R shoulder normal L shoulder: Symmetric, no TTP, mild discomfort with extremes of range of motion. Left arm: No swelling. Lower extremities: no pretibial edema bilaterally  Skin: Not pale. Not jaundice.  No rash. Neurologic:  alert & oriented X3.  Speech normal, gait appropriate for age and unassisted Psych--  Cognition and judgment appear intact.  Cooperative with normal attention span and concentration.  Behavior appropriate. No anxious or depressed appearing.      Assessment     ASSESSMENT Prediabetes  MSK: Cervical spondylosis Prostate cancer: Prostatectomy 04-2018 Thyroid nodules per ultrasound 04-2015: "Follow-up by clinical exam is recommended. If patient has known risk factors for thyroid carcinoma, consider follow-up ultrasound in 12 months. If patient is clinically hyperthyroid, consider nuclear medicine thyroid uptake and scan " ED ++FH lun cancer  H/o Smoker  H/o early repol on EKG H/o anxiety   PLAN: L shoulder pain, left arm pain, left chest pain: As described above, the area of worst pain seems to be the inner aspect of the left arm. EKG today: NSR, no change from previous. Suspect symptoms are MSK in nature: Shoulder or a radiculopathy is possible (but the pain encompasses dermatomes C6,  C7, T1). Plan: Chest, cervical and thoracic spine x-rays Ortho referral Round of prednisone, Tylenol, minimize the use of ibuprofen.  See AVS. Elevated BP: BP is a slightly elevated today, BP was normal when he went to urology in June. Low back pain: see last OV, resolved  Prediabetes: Last A1c slightly elevated. RTC 4 months s     This visit occurred during the SARS-CoV-2 public health emergency.  Safety protocols were in place, including screening questions prior to the visit, additional usage of staff PPE, and extensive cleaning of exam room while observing appropriate contact time as indicated for disinfecting solutions.

## 2021-03-26 NOTE — Patient Instructions (Addendum)
Come back in 4 months, please make an appointment.  Recommend to get COVID #4-you may get this done downstairs at the pharmacy today if you like.  Go to the first floor, proceed with x-rays  Take prednisone as prescribed for few days  Tylenol  500 mg OTC 2 tabs a day every 8 hours as needed for pain  Try to minimize the use of ibuprofen or naproxen If you decide to take them, always take it with food because may cause gastritis and ulcers.  If you notice nausea, stomach pain, change in the color of stools --->  Stop the medicine and let us know

## 2021-03-26 NOTE — Assessment & Plan Note (Signed)
L shoulder pain, left arm pain, left chest pain: As described above, the area of worst pain seems to be the inner aspect of the left arm. EKG today: NSR, no change from previous. Suspect symptoms are MSK in nature: Shoulder or a radiculopathy is possible (but the pain encompasses dermatomes C6, C7, T1). Plan: Chest, cervical and thoracic spine x-rays Ortho referral Round of prednisone, Tylenol, minimize the use of ibuprofen.  See AVS. Elevated BP: BP is a slightly elevated today, BP was normal when he went to urology in June. Low back pain: see last OV, resolved  Prediabetes: Last A1c slightly elevated. RTC 4 months s

## 2021-04-11 ENCOUNTER — Ambulatory Visit: Payer: 59 | Admitting: Orthopaedic Surgery

## 2021-06-18 ENCOUNTER — Telehealth: Payer: Self-pay | Admitting: Internal Medicine

## 2021-06-18 NOTE — Telephone Encounter (Signed)
Patient would like to get ear drops prescribed to help out with his ear ache. He was advised to make an appointment in order to further exam his easr and patient declined stating he just wants ear drops. He was informed that a note would be sent to Dr. Larose Kells and he would be contacted with further advise. Please advice.

## 2021-06-18 NOTE — Telephone Encounter (Signed)
Spoke w/ Pt- informed that per PCP he would need to see someone before ear drops can be sent. Offered to schedule him an appt for tomorrow- Pt declined d/t work schedule- he will go to urgent care.

## 2021-06-18 NOTE — Telephone Encounter (Signed)
Unable to prescribe eardrops without examination.

## 2021-07-31 ENCOUNTER — Ambulatory Visit: Payer: 59 | Admitting: Internal Medicine

## 2021-08-24 LAB — PSA: PSA: 0.1

## 2021-09-03 ENCOUNTER — Encounter: Payer: Self-pay | Admitting: Internal Medicine

## 2021-10-18 ENCOUNTER — Encounter: Payer: Self-pay | Admitting: Internal Medicine

## 2021-12-11 ENCOUNTER — Encounter: Payer: Self-pay | Admitting: Internal Medicine

## 2021-12-11 ENCOUNTER — Ambulatory Visit (INDEPENDENT_AMBULATORY_CARE_PROVIDER_SITE_OTHER): Payer: 59 | Admitting: Internal Medicine

## 2021-12-11 VITALS — BP 122/86 | HR 107 | Temp 98.1°F | Resp 16 | Ht 68.0 in | Wt 188.5 lb

## 2021-12-11 DIAGNOSIS — R739 Hyperglycemia, unspecified: Secondary | ICD-10-CM | POA: Diagnosis not present

## 2021-12-11 DIAGNOSIS — Z87891 Personal history of nicotine dependence: Secondary | ICD-10-CM

## 2021-12-11 DIAGNOSIS — Z0000 Encounter for general adult medical examination without abnormal findings: Secondary | ICD-10-CM

## 2021-12-11 DIAGNOSIS — Z23 Encounter for immunization: Secondary | ICD-10-CM | POA: Diagnosis not present

## 2021-12-11 DIAGNOSIS — Z1211 Encounter for screening for malignant neoplasm of colon: Secondary | ICD-10-CM

## 2021-12-11 NOTE — Progress Notes (Signed)
? ?Subjective:  ? ? Patient ID: Danny [name], male    DOB: 1960/12/13, 61 y.o.   MRN: [id number] ? ?DOS:  12/11/2021 ?Type of visit - description: cpx ? ?Here for CPX ?Continue with low back pain periodically.  Aleve helps. ?Was seen several months ago with left arm pain, possibly a radiculopathy.  Did not get to see Ortho.  Still has occasional symptoms. ? ? ?Review of Systems ?Denies chest pain or difficulty breathing ?No hematuria.  Some urinary incontinence. ? ?Other than above, a 14 point review of systems is negative  ? ? ? ?Past Medical History:  ?Diagnosis Date  ? Abnormal electrocardiogram   ? Abnormal glucose   ? Anxiety   ? Cigarette smoker   ? former smoker- none now   ? Erectile dysfunction   ? GERD (gastroesophageal reflux disease)   ? Paresthesia   ? Prostate cancer ([place])   ? ? ?Past Surgical History:  ?Procedure Laterality Date  ? LYMPHADENECTOMY Bilateral 04/27/2018  ? Procedure: LYMPHADENECTOMY, BILATERAL PELVIC;  Surgeon: Raynelle Bring, MD;  Location: [facility];  Service: Urology;  Laterality: Bilateral;  ? PROSTATE BIOPSY    ? ROBOT ASSISTED LAPAROSCOPIC RADICAL PROSTATECTOMY N/A 04/27/2018  ? Procedure: XI ROBOTIC ASSISTED LAPAROSCOPIC RADICAL PROSTATECTOMY LEVEL 2;  Surgeon: Raynelle Bring, MD;  Location: [facility];  Service: Urology;  Laterality: N/A;  ? ?Social History  ? ?Socioeconomic History  ? Marital status: Single  ?  Spouse name: Not on file  ? Number of children: 2  ? Years of education: Not on file  ? Highest education level: Not on file  ?Occupational History  ? 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 Smoking status: Former  ?  Packs/day: 0.50  ?  Years: 10.00  ?  Pack years: 5.00  ?  Types: Cigarettes  ?  Quit date: 08/20/2007  ?  Years since quitting: 14.3  ? Smokeless tobacco: Never  ? Tobacco comments:  ?  2009  ?Vaping Use  ? Vaping Use: Never used  ?Substance and Sexual Activity  ? Alcohol use: Yes  ?  Alcohol/week: 3.0 - 5.0 standard drinks  ?  Types: 2 Cans  of beer, 1 - 3 Standard drinks or equivalent per week  ?  Comment: beer -weekends  ? Drug use: No  ? Sexual activity: Yes  ?  Comment: with the aid of Viagra  ?Other Topics Concern  ? Not on file  ?Social History Narrative  ? Lives by himself   ? Lost 1 child   ? ?Social Determinants of Health  ? ?Financial Resource Strain: Not on file  ?Food Insecurity: Not on file  ?Transportation Needs: Not on file  ?Physical Activity: Not on file  ?Stress: Not on file  ?Social Connections: Not on file  ?Intimate Partner Violence: Not on file  ? ? ?Current Outpatient Medications  ?Medication Instructions  ? Ibuprofen 200-400 mg, Oral, Every 6 hours PRN  ? naproxen sodium (ALEVE) 220 mg, Oral, 2 times daily PRN  ? sildenafil (VIAGRA) 100 mg, Oral, Every other day  ? ? ?   ?Objective:  ? Physical Exam ?BP 122/86 (BP Location: Left Arm, Patient Position: Sitting, Cuff Size: Small)   Pulse (!) 107   Temp 98.1 ?F (36.7 ?C) (Oral)   Resp 16   Ht '5\' 8"'$  (1.727 m)   Wt 188 lb 8 oz (85.5 kg)   SpO2 98%   BMI 28.66 kg/m?  ?General: ?Well developed, NAD, BMI  noted ?Neck: No  thyromegaly  ?HEENT:  ?Normocephalic . Face symmetric, atraumatic ?Lungs:  ?CTA B ?Normal respiratory effort, no intercostal retractions, no accessory muscle use. ?Heart: RRR,  no murmur.  ?Abdomen:  ?Not distended, soft, non-tender. No rebound or rigidity.   ?Lower extremities: no pretibial edema bilaterally  ?Skin: Exposed areas without rash. Not pale. Not jaundice ?Neurologic:  ?alert & oriented X3.  ?Speech normal, gait appropriate for age and unassisted ?Strength symmetric and appropriate for age.  ?Psych: ?Cognition and judgment appear intact.  ?Cooperative with normal attention span and concentration.  ?Behavior appropriate. ?No anxious or depressed appearing. ? ?   ?Assessment   ? ?  ? ? ?ASSESSMENT ?Prediabetes ?MSK: Cervical spondylosis ?Prostate cancer: Prostatectomy 04-2018 ?Thyroid nodules per ultrasound 04-2015: "Follow-up by clinical exam is  recommended. If patient has known risk factors for thyroid carcinoma, consider follow-up ultrasound in 12 months. If patient is clinically hyperthyroid, consider nuclear medicine thyroid uptake and scan " ?ED ?++FH lun cancer  ?H/o Smoker  ?H/o early repol on EKG ?H/o anxiety  ? ?PLAN: ?Here for CPX ?Prediabetes: Diet and exercise discussed, check A1c ?MSK: ?See visit from few months ago, still having left arm pain with some tingling.  Referral to Ortho failed.  Also having low back pain periodically.  Again offered referral to orthopedics (declined for now).  Advised against frequent use of Aleve due to GI side effects. ?Thyroid nodules per [place] 04-2015: Previous referrals for follow-up failed, clinical exam normal, will reassess in 6 months. ?RTC 4 months ? ? ?  ?This visit occurred during the SARS-CoV-2 public health emergency.  Safety protocols were in place, including screening questions prior to the visit, additional usage of staff PPE, and extensive cleaning of exam room while observing appropriate contact time as indicated for disinfecting solutions.  ? ?

## 2021-12-11 NOTE — Patient Instructions (Signed)
? ?  We are referring you to gastroenterology to proceed with a colonoscopy.  You can contact them at [phone] ? ?We are referring you to have lung cancer screening CAT scan.  Expect a phone call ? ?You will need another shingles vaccine in 2 to 6 months ? ?GO TO THE LAB : Get the blood work   ? ? ?[place], [place] ?Come back for  a check up in 4 months  ?

## 2021-12-12 ENCOUNTER — Encounter: Payer: Self-pay | Admitting: Internal Medicine

## 2021-12-12 LAB — CBC WITH DIFFERENTIAL/PLATELET
Basophils Absolute: 0 10*3/uL (ref 0.0–0.1)
Basophils Relative: 1.1 % (ref 0.0–3.0)
Eosinophils Absolute: 0.1 10*3/uL (ref 0.0–0.7)
Eosinophils Relative: 1.6 % (ref 0.0–5.0)
HCT: 44.4 % (ref 39.0–52.0)
Hemoglobin: 15 g/dL (ref 13.0–17.0)
Lymphocytes Relative: 31.3 % (ref 12.0–46.0)
Lymphs Abs: 1.3 10*3/uL (ref 0.7–4.0)
MCHC: 33.7 g/dL (ref 30.0–36.0)
MCV: 91 fl (ref 78.0–100.0)
Monocytes Absolute: 0.6 10*3/uL (ref 0.1–1.0)
Monocytes Relative: 14.3 % — ABNORMAL HIGH (ref 3.0–12.0)
Neutro Abs: 2.1 10*3/uL (ref 1.4–7.7)
Neutrophils Relative %: 51.7 % (ref 43.0–77.0)
Platelets: 284 10*3/uL (ref 150.0–400.0)
RBC: 4.89 Mil/uL (ref 4.22–5.81)
RDW: 13.2 % (ref 11.5–15.5)
WBC: 4 10*3/uL (ref 4.0–10.5)

## 2021-12-12 LAB — LIPID PANEL
Cholesterol: 165 mg/dL (ref 0–200)
HDL: 43.5 mg/dL (ref >39.00)
LDL Cholesterol: 110 mg/dL — ABNORMAL HIGH (ref 0–99)
NonHDL: 121.54
Total CHOL/HDL Ratio: 4
Triglycerides: 60 mg/dL (ref 0.0–149.0)
VLDL: 12 mg/dL (ref 0.0–40.0)

## 2021-12-12 LAB — COMPREHENSIVE METABOLIC PANEL
ALT: 16 U/L (ref 0–53)
AST: 14 U/L (ref 0–37)
Albumin: 4.4 g/dL (ref 3.5–5.2)
Alkaline Phosphatase: 63 U/L (ref 39–117)
BUN: 19 mg/dL (ref 6–23)
CO2: 30 mEq/L (ref 19–32)
Calcium: 9.5 mg/dL (ref 8.4–10.5)
Chloride: 103 mEq/L (ref 96–112)
Creatinine, Ser: 0.99 mg/dL (ref 0.40–1.50)
GFR: 84.07 mL/min (ref >60.00)
Glucose, Bld: 88 mg/dL (ref 70–99)
Potassium: 4.8 mEq/L (ref 3.5–5.1)
Sodium: 140 mEq/L (ref 135–145)
Total Bilirubin: 1.6 mg/dL — ABNORMAL HIGH (ref 0.2–1.2)
Total Protein: 7 g/dL (ref 6.0–8.3)

## 2021-12-12 LAB — HEMOGLOBIN A1C: Hgb A1c MFr Bld: 6.3 % (ref 4.6–6.5)

## 2021-12-12 NOTE — Assessment & Plan Note (Signed)
--  Td -2015 ?-COVID VAX: plans to take a booster ?-shingrix: first shot today, next in few months.  Patient aware ?-- h/o  Prostate cancer : per urology ?--Colon cancer screening Cscope 04-2016, overdue for colonoscopy, will send a referral. ?--lung cancer screening: Has a FH of lung cancer, former smoker,  his father and 2 other family had lung cancer; we again discussed possible lung cancer CT screening, previous referrals failed.  He likes to proceed.  Referral sent. ?-FH lung, colon and prostate cancer: Offered genetic counselor referral.  Declined. ?--Labs: CMP, FLP, CBC, A1c, CT lung screening ?--Diet and exercise discussed.  ?

## 2021-12-12 NOTE — Assessment & Plan Note (Signed)
Here for CPX ?Prediabetes: Diet and exercise discussed, check A1c ?MSK: ?See visit from few months ago, still having left arm pain with some tingling.  Referral to Ortho failed.  Also having low back pain periodically.  Again offered referral to orthopedics (declined for now).  Advised against frequent use of Aleve due to GI side effects. ?Thyroid nodules per [place] 04-2015: Previous referrals for follow-up failed, clinical exam normal, will reassess in 6 months. ?RTC 4 months ?

## 2022-01-20 ENCOUNTER — Encounter (HOSPITAL_COMMUNITY): Payer: Self-pay

## 2022-01-20 ENCOUNTER — Emergency Department (HOSPITAL_COMMUNITY): Payer: 59

## 2022-01-20 ENCOUNTER — Emergency Department (HOSPITAL_COMMUNITY)
Admission: EM | Admit: 2022-01-20 | Discharge: 2022-01-20 | Disposition: A | Discharge status: Home / Self Care | Payer: 59 | Attending: Emergency Medicine | Admitting: Emergency Medicine

## 2022-01-20 DIAGNOSIS — N132 Hydronephrosis with renal and ureteral calculous obstruction: Secondary | ICD-10-CM | POA: Insufficient documentation

## 2022-01-20 DIAGNOSIS — R1031 Right lower quadrant pain: Secondary | ICD-10-CM | POA: Diagnosis present

## 2022-01-20 DIAGNOSIS — Z8546 Personal history of malignant neoplasm of prostate: Secondary | ICD-10-CM | POA: Insufficient documentation

## 2022-01-20 DIAGNOSIS — N200 Calculus of kidney: Secondary | ICD-10-CM

## 2022-01-20 LAB — COMPREHENSIVE METABOLIC PANEL
ALT: 18 U/L (ref 0–44)
AST: 15 U/L (ref 15–41)
Albumin: 4 g/dL (ref 3.5–5.0)
Alkaline Phosphatase: 57 U/L (ref 38–126)
Anion gap: 8 (ref 5–15)
BUN: 17 mg/dL (ref 6–20)
CO2: 24 mmol/L (ref 22–32)
Calcium: 8.8 mg/dL — ABNORMAL LOW (ref 8.9–10.3)
Chloride: 106 mmol/L (ref 98–111)
Creatinine, Ser: 1.21 mg/dL (ref 0.61–1.24)
GFR, Estimated: >60 mL/min (ref >60)
Glucose, Bld: 125 mg/dL — ABNORMAL HIGH (ref 70–99)
Potassium: 3.8 mmol/L (ref 3.5–5.1)
Sodium: 138 mmol/L (ref 135–145)
Total Bilirubin: 1.5 mg/dL — ABNORMAL HIGH (ref 0.3–1.2)
Total Protein: 6.9 g/dL (ref 6.5–8.1)

## 2022-01-20 LAB — CBC WITH DIFFERENTIAL/PLATELET
Abs Immature Granulocytes: 0.02 10*3/uL (ref 0.00–0.07)
Basophils Absolute: 0 10*3/uL (ref 0.0–0.1)
Basophils Relative: 1 %
Eosinophils Absolute: 0.1 10*3/uL (ref 0.0–0.5)
Eosinophils Relative: 1 %
HCT: 43.2 % (ref 39.0–52.0)
Hemoglobin: 14.4 g/dL (ref 13.0–17.0)
Immature Granulocytes: 0 %
Lymphocytes Relative: 12 %
Lymphs Abs: 0.7 10*3/uL (ref 0.7–4.0)
MCH: 30.6 pg (ref 26.0–34.0)
MCHC: 33.3 g/dL (ref 30.0–36.0)
MCV: 91.7 fL (ref 80.0–100.0)
Monocytes Absolute: 0.5 10*3/uL (ref 0.1–1.0)
Monocytes Relative: 8 %
Neutro Abs: 5 10*3/uL (ref 1.7–7.7)
Neutrophils Relative %: 78 %
Platelets: 287 10*3/uL (ref 150–400)
RBC: 4.71 MIL/uL (ref 4.22–5.81)
RDW: 12.6 % (ref 11.5–15.5)
WBC: 6.3 10*3/uL (ref 4.0–10.5)
nRBC: 0 % (ref 0.0–0.2)

## 2022-01-20 LAB — URINALYSIS, ROUTINE W REFLEX MICROSCOPIC
Bilirubin Urine: NEGATIVE
Glucose, UA: NEGATIVE mg/dL
Ketones, ur: 20 mg/dL — AB
Leukocytes,Ua: NEGATIVE
Nitrite: NEGATIVE
Protein, ur: NEGATIVE mg/dL
Specific Gravity, Urine: 1.026 (ref 1.005–1.030)
pH: 5 (ref 5.0–8.0)

## 2022-01-20 LAB — LIPASE, BLOOD: Lipase: 22 U/L (ref 11–51)

## 2022-01-20 MED ORDER — MORPHINE SULFATE (PF) 4 MG/ML IV SOLN
4.0000 mg | Freq: Once | INTRAVENOUS | Status: AC
  Administered 2022-01-20: 4 mg via INTRAVENOUS
  Filled 2022-01-20: qty 1

## 2022-01-20 MED ORDER — MORPHINE SULFATE 30 MG PO TABS: 30.0000 mg | ORAL_TABLET | Freq: Four times a day (QID) | ORAL | 0 refills | Status: AC | PRN

## 2022-01-20 MED ORDER — ONDANSETRON HCL 4 MG/2ML IJ SOLN
4.0000 mg | Freq: Once | INTRAMUSCULAR | Status: AC
  Administered 2022-01-20: 4 mg via INTRAVENOUS
  Filled 2022-01-20: qty 2

## 2022-01-20 MED ORDER — ONDANSETRON HCL 4 MG PO TABS: 4.0000 mg | ORAL_TABLET | Freq: Four times a day (QID) | ORAL | 0 refills | Status: AC | PRN

## 2022-01-20 MED ORDER — TAMSULOSIN HCL 0.4 MG PO CAPS: 0.4000 mg | ORAL_CAPSULE | Freq: Every day | ORAL | 0 refills | Status: AC

## 2022-01-20 MED ORDER — TAMSULOSIN HCL 0.4 MG PO CAPS
0.4000 mg | ORAL_CAPSULE | Freq: Once | ORAL | Status: AC
  Administered 2022-01-20: 0.4 mg via ORAL
  Filled 2022-01-20: qty 1

## 2022-01-20 MED ORDER — LACTATED RINGERS IV BOLUS
1000.0000 mL | Freq: Once | INTRAVENOUS | Status: AC
  Administered 2022-01-20: 1000 mL via INTRAVENOUS

## 2022-01-20 MED ORDER — KETOROLAC TROMETHAMINE 30 MG/ML IJ SOLN
30.0000 mg | Freq: Once | INTRAMUSCULAR | Status: AC
  Administered 2022-01-20: 30 mg via INTRAVENOUS
  Filled 2022-01-20: qty 1

## 2022-01-20 NOTE — ED Provider Triage Note (Signed)
Emergency Medicine Provider Triage Evaluation Note  Danny [name] , a 61 y.o. male  was evaluated in triage.  Pt complains of right lower quadrant abdominal pain.  He had a episode a few days ago that was in the same location however states it was much more mild and went away after he went to sleep. Today his pain started and became severe at about 5 PM.  He denies any fevers.  His last bowel movement was this morning.  His pain stays in his right lower quadrant, does not radiate or move.  No urinary symptoms.  He still has his appendix.    Physical Exam  BP 131/82 (BP Location: Right Arm)   [name] (!) 54   Temp 98.6 F (37 C) (Oral)   Resp 20   SpO2 100%  Gen:   Awake, appears uncomfortable Resp:  Normal effort  MSK:   Moves extremities without difficulty  Other:  2+ DP pulses bilaterally.  Abdomen is tender to palpation right lower quadrant  Medical Decision Making  Medically screening exam initiated at 7:46 PM.  Appropriate orders placed.  Danny [name] was informed that the remainder of the evaluation will be completed by another provider, this initial triage assessment does not replace that evaluation, and the importance of remaining in the ED until their evaluation is complete.     Lorin Glass, [place] 01/20/22 1947

## 2022-01-20 NOTE — ED Triage Notes (Signed)
Pt states that he has been having RLQ pain for the past few days, on and off with nausea, denies fevers.

## 2022-01-20 NOTE — Discharge Instructions (Addendum)
You are found to have a kidney stone on your CT scan which is already passed into your bladder.  You received IV pain medications and IV fluids as well as your first dose of Flomax while in the emergency department.  Your kidney function was normal.  Your urine did not show any signs of infection.  Please start Flomax, 1 capsule daily for 4 more days.  You have already received your first dose in the emergency department so you can take your first dose tomorrow. If you have recurrence of your pain, you can take morphine, 1 tablet every 6 hours as needed for severe pain.  You can also alternate Tylenol and Motrin every 3-4 hours as needed for pain. If you have recurrent nausea/vomiting, you can take Zofran, 1 tablet every 6 hours as needed for nausea/vomiting. Please follow-up with your urologist as soon as possible.

## 2022-01-20 NOTE — ED Provider Notes (Signed)
[facility] EMERGENCY DEPARTMENT Provider Note   CSN: [id number] Arrival date & time: 01/20/22  1922     History  Chief Complaint  Patient presents with   Abdominal Pain    Danny [name] is a 61 y.o. male with a history of prostate cancer s/p resection presenting to the ED with right flank pain.  Patient states that on Wednesday he had some mild pain over the right flank that last about 2 hours with include that resolved.  Today around 5 PM, he had acute onset severe right lower quadrant pain that has now traveled into the right flank.  He describes it as a severe sharp pain.  He has had nausea and vomiting with this pain.  Denies any hematuria or dysuria.  He has no history of kidney stones.  No recorded fevers, but did feel hot and clammy earlier.  No diarrhea, chest pain, shortness of breath.   Abdominal Pain Associated symptoms: nausea and vomiting   Associated symptoms: no diarrhea, no dysuria, no fever and no hematuria       Home Medications Prior to Admission medications   Medication Sig Start Date End Date Taking? Authorizing Provider  morphine (MSIR) 30 MG tablet Take 1 tablet (30 mg total) by mouth every 6 (six) hours as needed for up to 5 days for severe pain. 01/20/22 01/25/22 Yes Sondra Come, MD  ondansetron (ZOFRAN) 4 MG tablet Take 1 tablet (4 mg total) by mouth every 6 (six) hours as needed for up to 5 days for nausea or vomiting. 01/20/22 01/25/22 Yes Sondra Come, MD  tamsulosin (FLOMAX) 0.4 MG CAPS capsule Take 1 capsule (0.4 mg total) by mouth daily for 4 days. 01/20/22 01/24/22 Yes Sondra Come, MD  Ibuprofen 200 MG CAPS Take 200-400 mg by mouth every 6 (six) hours as needed.    [provider]  naproxen sodium (ALEVE) 220 MG tablet Take 1 tablet (220 mg total) by mouth 2 (two) times daily as needed. Patient not taking: Reported on 03/26/2021 02/11/19   Colon Branch, MD  sildenafil (VIAGRA) 100 MG tablet Take 100 mg by mouth every other day.     [provider]      Allergies    Patient has no known allergies.    Review of Systems   Review of Systems  Constitutional:  Negative for fever.  Gastrointestinal:  Positive for abdominal pain, nausea and vomiting. Negative for diarrhea.  Genitourinary:  Positive for flank pain. Negative for difficulty urinating, dysuria, hematuria, penile discharge, penile pain, penile swelling and testicular pain.   Physical Exam Updated Vital Signs BP (!) 146/86   Pulse 85   Temp 98.6 F (37 C) (Oral)   Resp (!) 21   SpO2 100%  Physical Exam Constitutional:      General: He is in acute distress.     Appearance: He is not diaphoretic.     Comments: Very uncomfortable appearing, writhing around in the bed.  HENT:     Head: Normocephalic and atraumatic.     Nose: Nose normal.     Mouth/Throat:     Mouth: Mucous membranes are moist.     Pharynx: Oropharynx is clear.  Cardiovascular:     Rate and Rhythm: Normal rate and regular rhythm.     Heart sounds: Normal heart sounds. No murmur heard.   No friction rub. No gallop.  Pulmonary:     Effort: Pulmonary effort is normal. No respiratory distress.  Breath sounds: Normal breath sounds. No stridor. No wheezing, rhonchi or rales.  Abdominal:     Palpations: Abdomen is soft.     Tenderness: There is no abdominal tenderness. There is no right CVA tenderness, left CVA tenderness, guarding or rebound. Negative signs include Murphy's sign, Rovsing's sign and McBurney's sign.  Musculoskeletal:        General: No deformity.     Cervical back: Neck supple.  Skin:    General: Skin is warm and dry.  Neurological:     General: No focal deficit present.     Mental Status: He is alert and oriented to person, place, and time.    ED Results / Procedures / Treatments   Labs (all labs ordered are listed, but only abnormal results are displayed) Labs Reviewed  COMPREHENSIVE METABOLIC PANEL - Abnormal; Notable for the following components:       Result Value   Glucose, Bld 125 (*)    Calcium 8.8 (*)    Total Bilirubin 1.5 (*)    All other components within normal limits  URINALYSIS, ROUTINE W REFLEX MICROSCOPIC - Abnormal; Notable for the following components:   Hgb urine dipstick SMALL (*)    Ketones, ur 20 (*)    Bacteria, UA RARE (*)    All other components within normal limits  LIPASE, BLOOD  CBC WITH DIFFERENTIAL/PLATELET    EKG None  Radiology CT Renal Stone Study  Result Date: 01/20/2022 CLINICAL DATA:  Flank pain, kidney stone suspected. EXAM: CT ABDOMEN AND PELVIS WITHOUT CONTRAST TECHNIQUE: Multidetector CT imaging of the abdomen and pelvis was performed following the standard protocol without IV contrast. RADIATION DOSE REDUCTION: This exam was performed according to the departmental dose-optimization program which includes automated exposure control, adjustment of the mA and/or kV according to patient size and/or use of iterative reconstruction technique. COMPARISON:  02/25/2018. FINDINGS: Lower chest: Mild atelectasis at the lung bases. Hepatobiliary: No focal liver abnormality is seen. No gallstones, gallbladder wall thickening, or biliary dilatation. Pancreas: Fatty atrophy of the pancreas is noted. No pancreatic ductal dilatation or surrounding inflammatory changes. Spleen: Normal in size without focal abnormality. Adrenals/Urinary Tract: No adrenal nodule or mass. A nonobstructive renal calculus is noted on the right. There is mild hydroureteronephrosis on the right with 2 stones in the urinary bladder on the right measuring up to 3 mm. There is mild perinephric and periureteral fat stranding on the right which is likely related to recently passed stone. No renal calculus or obstructive uropathy on the left. Stomach/Bowel: Small hiatal hernia is present. The stomach is otherwise within normal limits. Appendix appears normal. No evidence of bowel wall thickening, distention, or inflammatory changes. No free air or  pneumatosis. Vascular/Lymphatic: Aortic atherosclerosis. No enlarged abdominal or pelvic lymph nodes. Reproductive: Prostate is unremarkable. Other: No abdominopelvic ascites. A fat containing umbilical hernia is noted. Musculoskeletal: Degenerative changes are present in the thoracolumbar spine. No acute osseous abnormality. IMPRESSION: 1. Mild hydroureteronephrosis on the right with 2 calcifications in the urinary bladder on the right measuring up to 3 mm, suggesting recently passed stones. 2. Right renal calculus. 3. Aortic atherosclerosis. Electronically Signed   By: Brett Fairy M.D.   On: 01/20/2022 22:50    Procedures Procedures    Medications Ordered in ED Medications  tamsulosin (FLOMAX) capsule 0.4 mg (has no administration in time range)  lactated ringers bolus 1,000 mL (1,000 mLs Intravenous New Bag/Given 01/20/22 2223)  ondansetron (ZOFRAN) injection 4 mg (4 mg Intravenous Given 01/20/22 2218)  morphine (PF) 4 MG/ML injection 4 mg (4 mg Intravenous Given 01/20/22 2217)  ketorolac (TORADOL) 30 MG/ML injection 30 mg (30 mg Intravenous Given 01/20/22 2223)    ED Course/ Medical Decision Making/ A&P                           Medical Decision Making Amount and/or Complexity of Data Reviewed Labs: ordered.  Risk Prescription drug management.   61 year old male with a history of prostate cancer s/p resection presenting to the ED with right flank pain.  On exam, the patient is afebrile and hemodynamically stable.  He is very uncomfortable appearing and writhing around the bed.  He has no focal tenderness over his abdomen.  No CVA tenderness bilaterally.  I am most concerned for a kidney stone based on the patient's history and physical exam.  I have a low suspicion for appendicitis as the patient does not have focal right lower quadrant tenderness and no leukocytosis.  His lipase is normal so doubt pancreatitis.  CMP is unremarkable.  He has no focal tenderness over the right upper  quadrant, so low concern for cholecystitis or choledocholithiasis.  CBC with normal hemoglobin of 14.4.  His UA does show a small amount of blood, but negative nitrites, negative leukocytes, and only rare bacteria with no white blood cells, not concerning for infection.  Patient was given IV Zofran for nausea control.  Also given IV morphine, IV Toradol for pain control.  He was also given 1 L of IV fluids.  CT abdomen/pelvis without contrast was obtained and shows mild hydroureteronephrosis as well as 2 kidney stones within the bladder.  It does appear that he is likely passed the kidney stones.  On reevaluation, patient states that his pain is now resolved and he feels much improved.  He was given a dose of Flomax while in the ED.  I have recommended that he follow up closely with his urologist to discuss the kidney stones.  He was also given a prescription for Flomax to take daily for 4 more days (5 days total).  In case he does have recurrence of his pain and nausea, he was given a prescription for morphine and Zofran to be used as needed.  Strict return precautions were discussed and the patient was discharged home in stable condition.        Final Clinical Impression(s) / ED Diagnoses Final diagnoses:  Kidney stone    Rx / DC Orders ED Discharge Orders          Ordered    tamsulosin (FLOMAX) 0.4 MG CAPS capsule  Daily        01/20/22 2307    morphine (MSIR) 30 MG tablet  Every 6 hours PRN        01/20/22 2307    ondansetron (ZOFRAN) 4 MG tablet  Every 6 hours PRN        01/20/22 2307              Sondra Come, MD 01/20/22 2310    Noemi Chapel, MD 01/22/22 1623

## 2022-01-24 ENCOUNTER — Encounter: Payer: Self-pay | Admitting: Internal Medicine

## 2022-04-05 ENCOUNTER — Telehealth: Payer: Self-pay

## 2022-04-05 NOTE — Telephone Encounter (Signed)
Nurse Assessment Nurse: D'Heur Lucia Gaskins, RN, Adrienne Date/Time (Eastern Time): 04/05/2022 9:49:51 AM Confirm and document reason for call. If symptomatic, describe symptoms. ---Caller states he is having numbness and tingling in both arms at the same time. It started three weeks ago and is constant. No pain. No neck pain. Does the patient have any new or worsening symptoms? ---Yes Will a triage be completed? ---Yes Related visit to physician within the last 2 weeks? ---No Does the PT have any chronic conditions? (i.e. diabetes, asthma, this includes High risk factors for pregnancy, etc.) ---Yes List chronic conditions. ---Hx of prostate CA four years ago Is this a behavioral health or substance abuse call? ---No Guidelines Guideline Title Affirmed Question Affirmed Notes Nurse Date/Time (Eastern Time) Neurologic Deficit [1] Numbness or tingling on both sides of body AND [2] is a new symptom present > 24 hours D'Heur Lucia Gaskins, RN, Adrienne 04/05/2022 9:51:14 AM Disp. Time Eilene Ghazi Time) Disposition Final User 04/05/2022 9:58:03 AM SEE PCP WITHIN 3 DAYS Yes D'Heur Lucia Gaskins, RN, Adrienne PLEASE NOTE: All timestamps contained within this report are represented as [place] Standard Time. CONFIDENTIALTY NOTICE: This fax transmission is intended only for the addressee. It contains information that is legally privileged, confidential or otherwise protected from use or disclosure. If you are not the intended recipient, you are strictly prohibited from reviewing, disclosing, copying using or disseminating any of this information or taking any action in reliance on or regarding this information. If you have received this fax in error, please notify us immediately by telephone so that we can arrange for its return to [place]. Phone: [phone], Toll-Free: [phone], Fax: [phone] Page: 2 of 2 Call Id: [id number] Final Disposition 04/05/2022 9:58:03 AM SEE PCP WITHIN 3 DAYS Yes Battlement [place], RN,  Vincente Liberty Caller Disagree/Comply Comply Caller Understands Yes PreDisposition Call Doctor Care Advice Given Per Guideline SEE PCP WITHIN 3 DAYS: * You need to be seen within 2 or 3 days. CALL BACK IF: * You become worse CARE ADVICE given per Neurologic Deficit (Adult) guideline. Comments User: Vincente Liberty, D'Heur Lucia Gaskins, RN Date/Time Eilene Ghazi Time): 04/05/2022 9:57:53 AM Warm transferred to office

## 2022-04-05 NOTE — Telephone Encounter (Signed)
Appt scheduled w/ Dr. Etter Sjogren on 8/21.

## 2022-04-08 ENCOUNTER — Ambulatory Visit: Payer: 59 | Admitting: Family Medicine

## 2022-04-15 ENCOUNTER — Encounter: Payer: Self-pay | Admitting: Internal Medicine

## 2022-04-29 LAB — PSA: PSA: 0.2

## 2022-04-30 ENCOUNTER — Telehealth (HOSPITAL_BASED_OUTPATIENT_CLINIC_OR_DEPARTMENT_OTHER): Payer: Self-pay

## 2022-04-30 ENCOUNTER — Telehealth: Payer: Self-pay

## 2022-04-30 ENCOUNTER — Encounter: Payer: Self-pay | Admitting: Internal Medicine

## 2022-04-30 ENCOUNTER — Ambulatory Visit: Payer: 59 | Admitting: Internal Medicine

## 2022-04-30 VITALS — BP 142/84 | HR 77 | Temp 98.3°F | Resp 16 | Ht 68.0 in | Wt 187.0 lb

## 2022-04-30 DIAGNOSIS — Z114 Encounter for screening for human immunodeficiency virus [HIV]: Secondary | ICD-10-CM | POA: Diagnosis not present

## 2022-04-30 DIAGNOSIS — R739 Hyperglycemia, unspecified: Secondary | ICD-10-CM

## 2022-04-30 DIAGNOSIS — E042 Nontoxic multinodular goiter: Secondary | ICD-10-CM

## 2022-04-30 DIAGNOSIS — R202 Paresthesia of skin: Secondary | ICD-10-CM | POA: Diagnosis not present

## 2022-04-30 LAB — B12 AND FOLATE PANEL
Folate: 12.8 ng/mL (ref >5.9)
Vitamin B-12: 414 pg/mL (ref 211–911)

## 2022-04-30 LAB — CBC WITH DIFFERENTIAL/PLATELET
Basophils Absolute: 0 10*3/uL (ref 0.0–0.1)
Basophils Relative: 0.8 % (ref 0.0–3.0)
Eosinophils Absolute: 0.1 10*3/uL (ref 0.0–0.7)
Eosinophils Relative: 2 % (ref 0.0–5.0)
HCT: 45.1 % (ref 39.0–52.0)
Hemoglobin: 15 g/dL (ref 13.0–17.0)
Lymphocytes Relative: 31.6 % (ref 12.0–46.0)
Lymphs Abs: 1.3 10*3/uL (ref 0.7–4.0)
MCHC: 33.3 g/dL (ref 30.0–36.0)
MCV: 91.3 fl (ref 78.0–100.0)
Monocytes Absolute: 0.6 10*3/uL (ref 0.1–1.0)
Monocytes Relative: 15.1 % — ABNORMAL HIGH (ref 3.0–12.0)
Neutro Abs: 2 10*3/uL (ref 1.4–7.7)
Neutrophils Relative %: 50.5 % (ref 43.0–77.0)
Platelets: 279 10*3/uL (ref 150.0–400.0)
RBC: 4.94 Mil/uL (ref 4.22–5.81)
RDW: 13.5 % (ref 11.5–15.5)
WBC: 4 10*3/uL (ref 4.0–10.5)

## 2022-04-30 LAB — COMPREHENSIVE METABOLIC PANEL
ALT: 24 U/L (ref 0–53)
AST: 18 U/L (ref 0–37)
Albumin: 4.1 g/dL (ref 3.5–5.2)
Alkaline Phosphatase: 69 U/L (ref 39–117)
BUN: 18 mg/dL (ref 6–23)
CO2: 30 mEq/L (ref 19–32)
Calcium: 9.7 mg/dL (ref 8.4–10.5)
Chloride: 103 mEq/L (ref 96–112)
Creatinine, Ser: 0.98 mg/dL (ref 0.40–1.50)
GFR: 84.87 mL/min (ref >60.00)
Glucose, Bld: 113 mg/dL — ABNORMAL HIGH (ref 70–99)
Potassium: 5.4 mEq/L — ABNORMAL HIGH (ref 3.5–5.1)
Sodium: 140 mEq/L (ref 135–145)
Total Bilirubin: 0.8 mg/dL (ref 0.2–1.2)
Total Protein: 7.1 g/dL (ref 6.0–8.3)

## 2022-04-30 LAB — SEDIMENTATION RATE: Sed Rate: 6 mm/hr (ref 0–20)

## 2022-04-30 LAB — TSH: TSH: 2.65 u[IU]/mL (ref 0.35–5.50)

## 2022-04-30 LAB — VITAMIN D 25 HYDROXY (VIT D DEFICIENCY, FRACTURES): VITD: 19.57 ng/mL — ABNORMAL LOW (ref 30.00–100.00)

## 2022-04-30 LAB — T4, FREE: Free T4: 0.81 ng/dL (ref 0.60–1.60)

## 2022-04-30 LAB — HEMOGLOBIN A1C: Hgb A1c MFr Bld: 6.4 % (ref 4.6–6.5)

## 2022-04-30 LAB — T3, FREE: T3, Free: 3.5 pg/mL (ref 2.3–4.2)

## 2022-04-30 NOTE — Patient Instructions (Addendum)
Notify me if your symptoms get worse  We are referring you to neurology.   GO TO THE LAB : Get the blood work     [place], [place] Come back for a checkup in 3 months  STOP BY THE FIRST FLOOR: Schedule ultrasound of your thyroid

## 2022-04-30 NOTE — Progress Notes (Signed)
Subjective:    Patient ID: Danny [name], male    DOB: 1961-01-30, 61 y.o.   MRN: [id number]  DOS:  04/30/2022 Type of visit - description: f/u  Follow-up, his main concern is paresthesias. He describes his problems as follows.  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 About a week later he developed left arm paresthesia associated with mild weakness.  Symptoms lasted ~ 1 week. Approximately 2 weeks later he had similar symptoms, this time in both arms, symptoms resolved within few days.  At the time there was no fever chills No rash anywhere No neck pain. He has ongoing episodic back pain for which he takes Aleve. No bladder or bowel incontinence, no visual deficits.  No history of tick exposures.  No actual headache, dizziness, diplopia.  On 04/16/2022: Developed generalized, bilateral "pins-and-needles" feeling under the skin.  No rash, no hives, no itching. Symptoms are  improved but not completely gone, still has generalized pins-and-needles.  Denies shortness of breath, cough or weight loss.   Review of Systems See above   Past Medical History:  Diagnosis Date   Abnormal electrocardiogram    Abnormal glucose    Anxiety    Cigarette smoker    former smoker- none now    Erectile dysfunction    GERD (gastroesophageal reflux disease)    Paresthesia    Prostate cancer [facility])     Past Surgical History:  Procedure Laterality Date   LYMPHADENECTOMY Bilateral 04/27/2018   Procedure: LYMPHADENECTOMY, BILATERAL PELVIC;  Surgeon: Raynelle Bring, MD;  Location: [facility]S;  Service: Urology;  Laterality: Bilateral;   PROSTATE BIOPSY     ROBOT ASSISTED LAPAROSCOPIC RADICAL PROSTATECTOMY N/A 04/27/2018   Procedure: XI ROBOTIC ASSISTED LAPAROSCOPIC RADICAL PROSTATECTOMY LEVEL 2;  Surgeon: Raynelle Bring, MD;  Location: [facility];  Service: Urology;  Laterality: N/A;    Current Outpatient Medications  Medication Instructions   Ibuprofen 200-400 mg,  Every 6 hours PRN   Multiple Vitamin (MULTIVITAMIN WITH MINERALS) TABS tablet 1 tablet, Oral, Daily   naproxen sodium (ALEVE) 220 mg, Oral, 2 times daily PRN   sildenafil (VIAGRA) 100 mg, Oral, Every other day       Objective:   Physical Exam BP (!) 142/84   Pulse 77   Temp 98.3 F (36.8 C) (Oral)   Resp 16   Ht '5\' 8"'$  (1.727 m)   Wt 187 lb (84.8 kg)   SpO2 94%   BMI 28.43 kg/m  General:   Well developed, NAD, BMI noted.  HEENT:  Normocephalic . Face symmetric, atraumatic Neck: + R thyroid nodule, nontender. Lungs:  CTA B Normal respiratory effort, no intercostal retractions, no accessory muscle use. Heart: RRR,  no murmur.  Abdomen:  Not distended, soft, non-tender. No rebound or rigidity.   Skin: Not pale. Not jaundice Lower extremities: no pretibial edema bilaterally  Neurologic:  alert & oriented X3.  Speech normal, gait appropriate for age and unassisted Motor symmetric with the exception of the L arm, mildly weak? DTR symmetric Pinprick examination distal lower extremities normal.. Psych--  Cognition and judgment appear intact.  Cooperative with normal attention span and concentration.  Behavior appropriate. No anxious or depressed appearing.     Assessment    ASSESSMENT Prediabetes MSK: Cervical spondylosis Prostate cancer: Prostatectomy 04-2018 Thyroid nodules per ultrasound 04-2015: "Follow-up by clinical exam is recommended. If patient has known risk factors for thyroid carcinoma, consider follow-up ultrasound in 12 months. If patient  is clinically hyperthyroid, consider nuclear medicine thyroid uptake and scan " ED ++FH lun cancer  H/o Smoker  H/o early repol on EKG H/o anxiety   PLAN: Paresthesias: Started first week of July, as described above, etiology not clear. Had a shingles shot recently.   On chart review he also have cervical spondylosis documented by MRI years ago. DDx includes vitamin deficiency?  Myelopathy?  Others? Etiology not  completely clear Plan: Labs including X21, folic acid, sed rate, RPR, HIV, vitamin D. Neuro referral, call if symptoms worsen. Thyroid nodule: Check TFTs, check ultrasound. RTC 3 months

## 2022-04-30 NOTE — Assessment & Plan Note (Signed)
Paresthesias: Started first week of July, as described above, etiology not clear. Had a shingles shot recently.   On chart review he also have cervical spondylosis documented by MRI years ago. DDx includes vitamin deficiency?  Myelopathy?  Others? Etiology not completely clear Plan: Labs including V43, folic acid, sed rate, RPR, HIV, vitamin D. Neuro referral, call if symptoms worsen. Thyroid nodule: Check TFTs, check ultrasound. RTC 3 months

## 2022-04-30 NOTE — Telephone Encounter (Signed)
Pt called back this morning- he is very frustrated and yelling on the phone- wants to know what he can do for the pain he is in. Informed that he was just seen this morning, we are waiting for blood work to come back. He wanted to know if he can continue ibuprofen - recommended he switch to Tylenol '500mg'$  2 tabs q8h prn, he stated "oh great another sleepiness night", then hung up.

## 2022-05-01 MED ORDER — VITAMIN D (ERGOCALCIFEROL) 1.25 MG (50000 UNIT) PO CAPS: 50000.0000 [IU] | ORAL_CAPSULE | ORAL | 0 refills | Status: DC

## 2022-05-01 NOTE — Telephone Encounter (Signed)
Rx sent. Website mailed to Pt w/ note to call imaging to schedule thyroid  ultrasound.

## 2022-05-01 NOTE — Telephone Encounter (Signed)
Please add Lyme serology to his blood work. == Spoke with patient, aware of results. He tried Tylenol last night and helped. We agree on Tylenol 500 mg 2 tablets T.I.D. as needed. Avoid the stronger pain killers. Consider gabapentin.

## 2022-05-01 NOTE — Telephone Encounter (Signed)
Left a detailed message on his phone.  Vitamin D is low, potassium is slightly elevated, A1c 6.4.  RPR, HIV pending.  Plan:  -Pain management: Asked  for a call back to decide if he would like to try painkiller  -Ergocalciferol 50,000 units weekly x3 months, please send a prescription  -Needs a low potassium diet. Send a letter: Please visit this website for information about a low potassium diet " [employer]" [email or web address] management

## 2022-05-01 NOTE — Telephone Encounter (Signed)
Are we able to add on lyme serology? Dx: paresthesias?

## 2022-05-06 ENCOUNTER — Encounter: Payer: Self-pay | Admitting: Internal Medicine

## 2022-05-06 NOTE — Telephone Encounter (Signed)
Per Rashawn at [facility], Lyme testing was added on 9/14 and sent out for testing on 9/16.  Test will be set up today and result by 05/11/22.

## 2022-05-06 NOTE — Telephone Encounter (Signed)
Noted TY

## 2022-05-08 LAB — LYME DISEASE ANTIBODY WITH REFLEX TO IMMUNOASSAY (IGG, IGM): LYME AB, SCREEN: <=0.9 Index (ref <=0.90)

## 2022-05-08 LAB — RPR: RPR Ser Ql: NONREACTIVE

## 2022-05-08 LAB — HIV ANTIBODY (ROUTINE TESTING W REFLEX): HIV 1&2 Ab, 4th Generation: NONREACTIVE

## 2022-06-17 ENCOUNTER — Encounter: Payer: Self-pay | Admitting: Diagnostic Neuroimaging

## 2022-06-17 ENCOUNTER — Ambulatory Visit: Payer: 59 | Admitting: Diagnostic Neuroimaging

## 2022-06-17 VITALS — BP 127/87 | HR 87 | Ht 67.0 in | Wt 189.0 lb

## 2022-06-17 DIAGNOSIS — R200 Anesthesia of skin: Secondary | ICD-10-CM | POA: Diagnosis not present

## 2022-06-17 NOTE — Progress Notes (Signed)
[facility]  PATIENT: Danny [name] DOB: 10/29/1960  REFERRING CLINICIAN: Colon Branch, MD HISTORY FROM: patient  REASON FOR VISIT: new consult   HISTORICAL  CHIEF COMPLAINT:  Chief Complaint  Patient presents with   New Patient (Initial Visit)    Pt reports feeling numbness in both arms. He states feeling the tingling. He states he noticed it for the past 3 months. He reports he has a 'itchy' feeling that goes over his body and is concerned. Room 6 alone    HISTORY OF PRESENT ILLNESS:   61 year old male here for evaluation of intermittent numbness in bilateral upper extremities.  Symptoms started 2 to 3 months ago.  Symptoms tend to affect him later in the evening and when he is trying to go to sleep with numbness in his right greater than left arm from his shoulders down to his fingertips.  All fingers are affected.  No problems with lower extremities.  Symptoms can last 15 to 30 minutes at a time or longer.   REVIEW OF SYSTEMS: Full 14 system review of systems performed and negative with exception of: as per HPI.  ALLERGIES: No Known Allergies  HOME MEDICATIONS: Outpatient Medications Prior to Visit  Medication Sig Dispense Refill   Multiple Vitamin (MULTIVITAMIN WITH MINERALS) TABS tablet Take 1 tablet by mouth daily.     naproxen sodium (ALEVE) 220 MG tablet Take 1 tablet (220 mg total) by mouth 2 (two) times daily as needed. 60 tablet 4   sildenafil (VIAGRA) 100 MG tablet Take 100 mg by mouth every other day.     Vitamin D, Ergocalciferol, (DRISDOL) 1.25 MG (50000 UNIT) CAPS capsule Take 1 capsule (50,000 Units total) by mouth every 7 (seven) days. 12 capsule 0   Ibuprofen 200 MG CAPS Take 200-400 mg by mouth every 6 (six) hours as needed. (Patient not taking: Reported on 04/30/2022)     No facility-administered medications prior to visit.    PAST MEDICAL HISTORY: Past Medical History:  Diagnosis Date   Abnormal electrocardiogram    Abnormal  glucose    Anxiety    Cigarette smoker    former smoker- none now    Erectile dysfunction    GERD (gastroesophageal reflux disease)    Paresthesia    Prostate cancer ([place])     PAST SURGICAL HISTORY: Past Surgical History:  Procedure Laterality Date   LYMPHADENECTOMY Bilateral 04/27/2018   Procedure: LYMPHADENECTOMY, BILATERAL PELVIC;  Surgeon: Raynelle Bring, MD;  Location: [facility];  Service: Urology;  Laterality: Bilateral;   PROSTATE BIOPSY     ROBOT ASSISTED LAPAROSCOPIC RADICAL PROSTATECTOMY N/A 04/27/2018   Procedure: XI ROBOTIC ASSISTED LAPAROSCOPIC RADICAL PROSTATECTOMY LEVEL 2;  Surgeon: Raynelle Bring, MD;  Location: [facility];  Service: Urology;  Laterality: N/A;    FAMILY HISTORY: Family History  Problem Relation Age of Onset   Lung cancer Father        age 72, smoker   Leukemia Brother    Prostate cancer Other        2 uncles and grandfather   Colon cancer Other        GM, in her 49s   Colon cancer Paternal Grandmother    Lung cancer Paternal Grandfather    Prostate cancer Paternal Grandfather    Lung cancer Other        2 great-uncles    Lung cancer Paternal Uncle    Prostate cancer Paternal Uncle    Prostate cancer Maternal Uncle    Prostate  cancer Maternal Uncle    CAD Neg Hx    Stroke Neg Hx    Diabetes Neg Hx    Colon polyps Neg Hx     SOCIAL HISTORY: Social History   Socioeconomic History   Marital status: Single    Spouse name: Not on file   Number of children: 2   Years of education: Not on file   Highest education level: Not on file  Occupational History   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  Tobacco Use   Smoking status: Former    Packs/day: 0.50    Years: 10.00    Total pack years: 5.00    Types: Cigarettes    Quit date: 08/20/2007    Years since quitting: 14.8   Smokeless tobacco: Never   Tobacco comments:    2009  Vaping Use   Vaping Use: Never used  Substance and Sexual Activity   Alcohol use: Yes    Alcohol/week: 3.0  - 5.0 standard drinks of alcohol    Types: 2 Cans of beer, 1 - 3 Standard drinks or equivalent per week    Comment: beer -weekends   Drug use: No   Sexual activity: Yes    Comment: with the aid of Viagra  Other Topics Concern   Not on file  Social History Narrative   Lives by himself    Lost 1 child    Social Determinants of Health   Financial Resource Strain: Not on file  Food Insecurity: Not on file  Transportation Needs: Not on file  Physical Activity: Not on file  Stress: Not on file  Social Connections: Not on file  Intimate Partner Violence: Not on file     PHYSICAL EXAM  GENERAL EXAM/CONSTITUTIONAL: Vitals:  Vitals:   06/17/22 1028  BP: 127/87  Pulse: 87  Weight: 189 lb (85.7 kg)  Height: '5\' 7"'$  (1.702 m)   Body mass index is 29.6 kg/m. Wt Readings from Last 3 Encounters:  06/17/22 189 lb (85.7 kg)  04/30/22 187 lb (84.8 kg)  12/11/21 188 lb 8 oz (85.5 kg)   Patient is in no distress; well developed, nourished and groomed; neck is supple  CARDIOVASCULAR: Examination of carotid arteries is normal; no carotid bruits Regular rate and rhythm, no murmurs Examination of peripheral vascular system by observation and palpation is normal  EYES: Ophthalmoscopic exam of optic discs and posterior segments is normal; no papilledema or hemorrhages No results found.  MUSCULOSKELETAL: Gait, strength, tone, movements noted in Neurologic exam below  NEUROLOGIC: MENTAL STATUS:      No data to display         awake, alert, oriented to person, place and time recent and remote memory intact normal attention and concentration language fluent, comprehension intact, naming intact fund of knowledge appropriate  CRANIAL NERVE:  2nd - no papilledema on fundoscopic exam 2nd, 3rd, 4th, 6th - pupils equal and reactive to light, visual fields full to confrontation, extraocular muscles intact, no nystagmus 5th - facial sensation symmetric 7th - facial strength  symmetric 8th - hearing intact 9th - palate elevates symmetrically, uvula midline 11th - shoulder shrug symmetric 12th - tongue protrusion midline  MOTOR:  normal bulk and tone, full strength in the BUE, BLE  SENSORY:  normal and symmetric to light touch, temperature, vibration PHALEN'S NEGATIVE  COORDINATION:  finger-nose-finger, fine finger movements normal  REFLEXES:  deep tendon reflexes present and symmetric; SLIGHTLY BRISK IN BUE  GAIT/STATION:  narrow based gait  DIAGNOSTIC DATA (LABS, IMAGING, TESTING) - I reviewed patient records, labs, notes, testing and imaging myself where available.  Lab Results  Component Value Date   WBC 4.0 04/30/2022   HGB 15.0 04/30/2022   HCT 45.1 04/30/2022   MCV 91.3 04/30/2022   PLT 279.0 04/30/2022      Component Value Date/Time   NA 140 04/30/2022 0931   K 5.4 (H) 04/30/2022 0931   CL 103 04/30/2022 0931   CO2 30 04/30/2022 0931   GLUCOSE 113 (H) 04/30/2022 0931   GLUCOSE 99 08/29/2006 1038   BUN 18 04/30/2022 0931   CREATININE 0.98 04/30/2022 0931   CALCIUM 9.7 04/30/2022 0931   PROT 7.1 04/30/2022 0931   ALBUMIN 4.1 04/30/2022 0931   AST 18 04/30/2022 0931   ALT 24 04/30/2022 0931   ALKPHOS 69 04/30/2022 0931   BILITOT 0.8 04/30/2022 0931   GFRNONAA >60 01/20/2022 1950   GFRAA >60 04/23/2018 1236   Lab Results  Component Value Date   CHOL 165 12/11/2021   HDL 43.50 12/11/2021   LDLCALC 110 (H) 12/11/2021   TRIG 60.0 12/11/2021   CHOLHDL 4 12/11/2021   Lab Results  Component Value Date   HGBA1C 6.4 04/30/2022   Lab Results  Component Value Date   XFGHWEXH37 169 04/30/2022   Lab Results  Component Value Date   TSH 2.65 04/30/2022   07/05/11 MRI brain -Normal MRI of the brain   07/05/11 MRI cervical spine C3-4:  Spondylosis and central disc herniation.  Effacement of the  subarachnoid space and indentation of the cord.  Mild foraminal  encroachment bilaterally by osteophytes.   C4-5:   Spondylosis and central disc herniation.  Effacement of the  subarachnoid space and indentation of the cord.   C5-6:  Spondylosis and small central disc protrusion.  No apparent  neural compression.   C6-7:  Spondylosis and central/right paracentral disc herniation.  Indentation of the cord on the right side.  Neural foraminal  stenosis bilaterally because of osteophyte and disc material that  could compress either or both C7 nerve roots.     ASSESSMENT AND PLAN  61 y.o. year old male here with:   Dx:  1. Bilateral hand numbness     PLAN:  INTERMITTENT, NOCTURNAL BILATERAL HAND / ARM NUMBNESS AND PAIN (since July 2023) - check EMG / NCS (rule out carpal tunnel syndrome, ulnar neuropathies or cervical radiculopathies) - then may consider repeat MRI cervical spine  Orders Placed This Encounter  Procedures   NCV with EMG(electromyography)   Return for pending test results, pending if symptoms worsen or fail to improve.    Penni Bombard, MD 67/89/3810, 17:51 AM Certified in Neurology, Neurophysiology and Neuroimaging  [facility] [address] [place], [place] [address] [phone]

## 2022-06-17 NOTE — Patient Instructions (Signed)
BILATERAL HAND / ARM NUMBNESS (since ~July 2023) - check EMG / NCS (electrical nerve testing) - then may consider MRI cervical spine

## 2022-07-30 ENCOUNTER — Ambulatory Visit: Payer: 59 | Admitting: Internal Medicine

## 2022-07-30 ENCOUNTER — Encounter: Payer: Self-pay | Admitting: Internal Medicine

## 2022-07-30 VITALS — BP 134/82 | HR 71 | Temp 97.9°F | Resp 16 | Ht 67.0 in | Wt 187.4 lb

## 2022-07-30 DIAGNOSIS — R202 Paresthesia of skin: Secondary | ICD-10-CM

## 2022-07-30 DIAGNOSIS — E559 Vitamin D deficiency, unspecified: Secondary | ICD-10-CM

## 2022-07-30 DIAGNOSIS — E875 Hyperkalemia: Secondary | ICD-10-CM | POA: Diagnosis not present

## 2022-07-30 DIAGNOSIS — E042 Nontoxic multinodular goiter: Secondary | ICD-10-CM

## 2022-07-30 DIAGNOSIS — M79672 Pain in left foot: Secondary | ICD-10-CM

## 2022-07-30 LAB — BASIC METABOLIC PANEL
BUN: 18 mg/dL (ref 6–23)
CO2: 28 mEq/L (ref 19–32)
Calcium: 9.4 mg/dL (ref 8.4–10.5)
Chloride: 104 mEq/L (ref 96–112)
Creatinine, Ser: 0.96 mg/dL (ref 0.40–1.50)
GFR: 86.85 mL/min (ref >60.00)
Glucose, Bld: 120 mg/dL — ABNORMAL HIGH (ref 70–99)
Potassium: 4.7 mEq/L (ref 3.5–5.1)
Sodium: 140 mEq/L (ref 135–145)

## 2022-07-30 NOTE — Patient Instructions (Addendum)
Gabapentin 100 mg: Take 1 tablet at bedtime for 4 to 5 days. Then 2 tablets at bedtime for 4 to 5 days. Then 3 tablets at bedtime. If it is not helping with your problems in the arms then you can stop it  Please reach out to gastroenterology to schedule your colonoscopy.  They can be reached at [phone]  Take vitamin D over-the-counter: 2000 units daily  Continue doing your low potassium diet  Vaccines I recommend:  Covid booster Shingrix (shingles) #2     GO TO THE LAB : Get the blood work     [place], [place] back for a physical exam by 11-2022

## 2022-07-30 NOTE — Progress Notes (Unsigned)
Subjective:    Patient ID: Danny [name], male    DOB: 12-Sep-1960, 61 y.o.   MRN: [id number]  DOS:  07/30/2022 Type of visit - description: Follow-up  Here for follow-up History of paresthesias, they have not changed, saw neurology, note reviewed. Also has developed left foot pain few days ago, no injury, no redness, no swelling. Other chronical medical problems were addressed  Review of Systems See above   Past Medical History:  Diagnosis Date   Abnormal electrocardiogram    Abnormal glucose    Anxiety    Cigarette smoker    former smoker- none now    Erectile dysfunction    GERD (gastroesophageal reflux disease)    Paresthesia    Prostate cancer [facility])     Past Surgical History:  Procedure Laterality Date   LYMPHADENECTOMY Bilateral 04/27/2018   Procedure: LYMPHADENECTOMY, BILATERAL PELVIC;  Surgeon: Raynelle Bring, MD;  Location: [facility];  Service: Urology;  Laterality: Bilateral;   PROSTATE BIOPSY     ROBOT ASSISTED LAPAROSCOPIC RADICAL PROSTATECTOMY N/A 04/27/2018   Procedure: XI ROBOTIC ASSISTED LAPAROSCOPIC RADICAL PROSTATECTOMY LEVEL 2;  Surgeon: Raynelle Bring, MD;  Location: [facility];  Service: Urology;  Laterality: N/A;    Current Outpatient Medications  Medication Instructions   Ibuprofen 200-400 mg, Every 6 hours PRN   Multiple Vitamin (MULTIVITAMIN WITH MINERALS) TABS tablet 1 tablet, Oral, Daily   naproxen sodium (ALEVE) 220 mg, Oral, 2 times daily PRN   sildenafil (VIAGRA) 100 mg, Oral, Every other day   Vitamin D (Ergocalciferol) (DRISDOL) 50,000 Units, Oral, Every 7 days       Objective:   Physical Exam BP 134/82   Pulse 71   Temp 97.9 F (36.6 C) (Oral)   Resp 16   Ht '5\' 7"'$  (1.702 m)   Wt 187 lb 6 oz (85 kg)   SpO2 98%   BMI 29.35 kg/m  General:   Well developed, NAD, BMI noted. HEENT:  Normocephalic . Face symmetric, atraumatic Lungs:  CTA B Normal respiratory effort, no intercostal retractions, no accessory muscle use. Heart: RRR,   no murmur.  Lower extremities: Right knee TTP at the external side of the left foot dorsum.  Otherwise exam is normal Skin: Not pale. Not jaundice Neurologic:  alert & oriented X3.  Speech normal, gait appropriate for age and unassisted Psych--  Cognition and judgment appear intact.  Cooperative with normal attention span and concentration.  Behavior appropriate. No anxious or depressed appearing.      Assessment     ASSESSMENT Prediabetes MSK: Cervical spondylosis Prostate cancer: Prostatectomy 04-2018 Thyroid nodules per ultrasound 04-2015: "Follow-up by clinical exam is recommended. If patient has known risk factors for thyroid carcinoma, consider follow-up ultrasound in 12 months. If patient is clinically hyperthyroid, consider nuclear medicine thyroid uptake and scan " ED ++FH lun cancer  H/o Smoker  H/o early repol on EKG H/o anxiety   PLAN: Prediabetes: Last A1c 6.4, recheck on RTC Thyroid nodules: Again [place] referral failed late.  TFTs were normal. Hyperkalemia: was recommend the low K+ diet.  Check BMP Paresthesias: See LOV, blood work to rule out etiologies of paresthesias came back okay (vitamin D was low). Saw neurology 06/17/2022, Rx EMG/NCS (pending for 09-2022) and consider MRI cervical spine. Plan: Check homocystine and methylmalonic acid. Most symptoms are at night, trial with gabapentin 100 mg: 1, 2 and eventually 3 tablets at bedtime.  He understands this is only symptomatic treatment.   Vitamin D deficiencie: dx 04-2022, s/p  ergocalciferol.  Recommend OTC vitamin D3: 2000 units daily Left foot pain: Exam is negative except for mild TTP.  Declined further eval at this point Due for colonoscopy: Recommend to reach out to GI.  See AVS RTC 11-2018 for CPX       ==== Paresthesias: Started first week of July, as described above, etiology not clear. Had a shingles shot recently.   On chart review he also have cervical spondylosis documented by MRI years  ago. DDx includes vitamin deficiency?  Myelopathy?  Others? Etiology not completely clear Plan: Labs including L89, folic acid, sed rate, RPR, HIV, vitamin D. Neuro referral, call if symptoms worsen. Thyroid nodule: Check TFTs, check ultrasound. RTC 3 months

## 2022-07-31 DIAGNOSIS — E559 Vitamin D deficiency, unspecified: Secondary | ICD-10-CM | POA: Insufficient documentation

## 2022-07-31 LAB — PSA: PSA: 0.25

## 2022-07-31 LAB — HOMOCYSTEINE: Homocysteine: 10.2 umol/L (ref <11.4)

## 2022-07-31 NOTE — Assessment & Plan Note (Signed)
Prediabetes: Last A1c 6.4, recheck on RTC Thyroid nodules: Again [place] referral failed; TFTs were normal. Hyperkalemia: was recommend the low K+ diet.  Check BMP Paresthesias: See LOV, blood work to rule out etiologies of paresthesias came back okay (vitamin D was low). Saw neurology 06/17/2022, Rx EMG/NCS (pending for 09-2022) and consider MRI cervical spine. Plan: Check homocystine and methylmalonic acid for completeness While we wait for the NCS of the treatment, most sxsx are at night, trial with gabapentin 100 mg: 1, 2 and eventually 3 tablets at bedtime.  He understands this is only symptomatic treatment.   Vitamin D deficiencie: dx 04-2022, s/p  ergocalciferol.  Recommend OTC vitamin D3: 2000 units daily Left foot pain: Exam is negative except for mild TTP.  Declined further eval at this point Due for colonoscopy: Recommend to reach out to GI.  See AVS RTC 11-2018 for CPX

## 2022-08-01 ENCOUNTER — Telehealth: Payer: Self-pay | Admitting: Internal Medicine

## 2022-08-01 LAB — METHYLMALONIC ACID, SERUM: Methylmalonic Acid, Quant: 136 nmol/L (ref 87–318)

## 2022-08-01 MED ORDER — GABAPENTIN 100 MG PO CAPS: 100.0000 mg | ORAL_CAPSULE | Freq: Every day | ORAL | 0 refills | Status: DC

## 2022-08-01 NOTE — Telephone Encounter (Signed)
Sorry, prescription sent

## 2022-08-01 NOTE — Telephone Encounter (Signed)
Please advise- I don't see where gabapentin was sent to his pharmacy?

## 2022-08-01 NOTE — Telephone Encounter (Signed)
Patient states he was supposed to be given a new medication per his 12/12 OV, but his pharmacy has yet to receive it. He would like a call back to know if a new med will be sent. Please advise.

## 2022-08-02 NOTE — Telephone Encounter (Signed)
Mychart message sent.

## 2022-08-08 ENCOUNTER — Encounter: Payer: Self-pay | Admitting: Internal Medicine

## 2022-08-22 ENCOUNTER — Other Ambulatory Visit (HOSPITAL_COMMUNITY): Payer: Self-pay | Admitting: Urology

## 2022-08-22 ENCOUNTER — Encounter (HOSPITAL_COMMUNITY): Payer: Self-pay | Admitting: Urology

## 2022-08-22 DIAGNOSIS — C61 Malignant neoplasm of prostate: Secondary | ICD-10-CM

## 2022-08-23 ENCOUNTER — Telehealth: Payer: Self-pay

## 2022-08-23 NOTE — Telephone Encounter (Signed)
    1. I confirmed with the patient he is aware of his referral to the clinic 1/12, arriving @ 12:30 pm.    2. I discussed the format of the clinic and the physicians he will be seeing that day.   3. I discussed where the clinic is located and how to contact me.   4. I confirmed his address and informed him I would be mailing a packet of information and forms to be completed. I asked him to bring them with him the day of his appointment.    He voiced understanding of the above. I asked him to call me if he has any questions or concerns regarding his appointments or the forms he needs to complete.

## 2022-09-06 ENCOUNTER — Ambulatory Visit (HOSPITAL_COMMUNITY)
Admission: RE | Admit: 2022-09-06 | Discharge: 2022-09-06 | Disposition: A | Discharge status: Home / Self Care | Payer: 59 | Source: Ambulatory Visit | Attending: Urology | Admitting: Urology

## 2022-09-06 DIAGNOSIS — C61 Malignant neoplasm of prostate: Secondary | ICD-10-CM | POA: Insufficient documentation

## 2022-09-06 MED ORDER — PIFLIFOLASTAT F 18 (PYLARIFY) INJECTION
9.0000 | Freq: Once | INTRAVENOUS | Status: AC
  Administered 2022-09-06: 9.9 via INTRAVENOUS

## 2022-09-09 ENCOUNTER — Telehealth: Payer: Self-pay

## 2022-09-09 NOTE — Telephone Encounter (Signed)
Spoke to patient to confirm he received packet, he confirmed he did and will fill it out and bring it when he comes tomorrow

## 2022-09-09 NOTE — Progress Notes (Addendum)
Care Plan Summary  Name: Danny [name] DOB: 1961-08-03   Your Medical Team:   Urologist -  Dr. Raynelle Bring, [facility]  Radiation Oncologist - Dr. Tyler Pita, [place]    Recommendations: 1) Salvage radiation to the prostate fossa    * These recommendations are based on information available as of today's consult.      Recommendations may change depending on the results of further tests or exams.    Next Steps: 1) You will be contacted by the radiation department to get set up for your CT Simulation.    When appointments need to be scheduled, you will be contacted by [facility] and/or [facility].  Questions?  Please do not hesitate to call Katheren Puller, BSN, RN at [phone] with any questions or concerns.  Kathlee Nations is your Oncology Nurse Navigator and is available to assist you while you're receiving your medical care at [facility].

## 2022-09-10 ENCOUNTER — Inpatient Hospital Stay: Payer: 59 | Attending: Radiation Oncology

## 2022-09-10 ENCOUNTER — Inpatient Hospital Stay (HOSPITAL_BASED_OUTPATIENT_CLINIC_OR_DEPARTMENT_OTHER): Payer: 59 | Admitting: Genetic Counselor

## 2022-09-10 ENCOUNTER — Other Ambulatory Visit: Payer: Self-pay

## 2022-09-10 ENCOUNTER — Ambulatory Visit
Admission: RE | Admit: 2022-09-10 | Discharge: 2022-09-10 | Disposition: A | Discharge status: Home / Self Care | Payer: 59 | Source: Ambulatory Visit | Attending: Radiation Oncology | Admitting: Radiation Oncology

## 2022-09-10 ENCOUNTER — Other Ambulatory Visit: Payer: Self-pay | Admitting: Genetic Counselor

## 2022-09-10 VITALS — BP 138/95 | HR 66 | Temp 97.0°F | Resp 18 | Ht 67.0 in | Wt 189.8 lb

## 2022-09-10 DIAGNOSIS — Z1379 Encounter for other screening for genetic and chromosomal anomalies: Secondary | ICD-10-CM

## 2022-09-10 DIAGNOSIS — C61 Malignant neoplasm of prostate: Secondary | ICD-10-CM

## 2022-09-10 DIAGNOSIS — Z8042 Family history of malignant neoplasm of prostate: Secondary | ICD-10-CM

## 2022-09-10 LAB — GENETIC SCREENING ORDER

## 2022-09-10 NOTE — Progress Notes (Signed)
Radiation Oncology         [phone] ________________________________  Multidisciplinary Prostate Cancer Clinic  Initial Radiation Oncology Consultation  Name: Danny [name] MRN: [id number]  Date: 09/10/2022  DOB: 1960-10-01  CC:Paz, Alda Berthold, MD  Raynelle Bring, MD   REFERRING PHYSICIAN: Raynelle Bring, MD  DIAGNOSIS: 62 y.o. gentleman with a biochemical recurrence of prostate cancer with a current PSA of 0.25 s/p RALP 04/2018 for Stage p(T3b, N0), Gleason 4+5 prostate cancer.    ICD-10-CM   1. Malignant neoplasm of prostate ([place])  C61       HISTORY OF PRESENT ILLNESS: Danny [name] is a 62 y.o. male with a diagnosis of biochemically recurrent prostate cancer. He was previously followed by Dr. Junious Silk for BPH, but was noted to have a persistently elevated PSA of 5.19 in 12/2017. He was subsequently diagnosed with Gleason 4+5 prostate cancer on 02/03/18.  We initially met the patient in our multidisciplinary prostate cancer clinic on 02/27/18 to discuss treatment options.     He opted to proceed with RALP on 04/27/18 under the care of Dr. Alinda Money.  Final surgical pathology revealed Gleason 4+5 prostatic adenocarcinoma with extraprostatic extension, left seminal vesicle invasion, and lymphovascular invasion. All 12 lymph nodes and resection margins were negative. His postoperative PSA was undetectable, however it became low detectable at 0.016 on the subsequent PSA lab in 10/2018.     His PSA has continued to slowly rise since that time, with his most recent PSA from 07/2022 up to 0.25.  Posttreatment PSA trend: 0.016 10/2018 0.022 04/2019 0.043 05/2020 0.084 12/2020 0.10 08/2021 0.2 04/2022 0.25 07/2022 This prompted restaging PSMA PET scan on 09/06/22 which showed a cluster of radiotracer activity in the left aspect of the prostatectomy bed but no evidence of metastatic adenopathy, visceral metastasis, or skeletal metastasis.    The patient reviewed the biopsy results with his  urologist and he has kindly been referred today to the multidisciplinary prostate cancer clinic for presentation of pathology and radiology studies in our conference for discussion of potential radiation treatment options and clinical evaluation.   PREVIOUS RADIATION THERAPY: No  PAST MEDICAL HISTORY:  Past Medical History:  Diagnosis Date   Abnormal electrocardiogram    Abnormal glucose    Anxiety    Cigarette smoker    former smoker- none now    Erectile dysfunction    GERD (gastroesophageal reflux disease)    Paresthesia    Prostate cancer ([place])       PAST SURGICAL HISTORY: Past Surgical History:  Procedure Laterality Date   LYMPHADENECTOMY Bilateral 04/27/2018   Procedure: LYMPHADENECTOMY, BILATERAL PELVIC;  Surgeon: Raynelle Bring, MD;  Location: [facility];  Service: Urology;  Laterality: Bilateral;   PROSTATE BIOPSY     ROBOT ASSISTED LAPAROSCOPIC RADICAL PROSTATECTOMY N/A 04/27/2018   Procedure: XI ROBOTIC ASSISTED LAPAROSCOPIC RADICAL PROSTATECTOMY LEVEL 2;  Surgeon: Raynelle Bring, MD;  Location: [facility];  Service: Urology;  Laterality: N/A;    FAMILY HISTORY:  Family History  Problem Relation Age of Onset   Lung cancer Father        age 59, smoker   Leukemia Brother    Prostate cancer Other        2 uncles and grandfather   Colon cancer Other        GM, in her 27s   Colon cancer Paternal Grandmother    Lung cancer Paternal Grandfather    Prostate cancer Paternal Grandfather    Lung cancer Other  2 great-uncles    Lung cancer Paternal Uncle    Prostate cancer Paternal Uncle    Prostate cancer Maternal Uncle    Prostate cancer Maternal Uncle    CAD Neg Hx    Stroke Neg Hx    Diabetes Neg Hx    Colon polyps Neg Hx     SOCIAL HISTORY:  Social History   Socioeconomic History   Marital status: Single    Spouse name: Not on file   Number of children: 2   Years of education: Not on file   Highest education level: Not on file  Occupational History    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  Tobacco Use   Smoking status: Former    Packs/day: 0.50    Years: 10.00    Total pack years: 5.00    Types: Cigarettes    Quit date: 08/20/2007    Years since quitting: 15.0   Smokeless tobacco: Never   Tobacco comments:    2009  Vaping Use   Vaping Use: Never used  Substance and Sexual Activity   Alcohol use: Yes    Alcohol/week: 3.0 - 5.0 standard drinks of alcohol    Types: 2 Cans of beer, 1 - 3 Standard drinks or equivalent per week    Comment: beer -weekends   Drug use: No   Sexual activity: Yes    Comment: with the aid of Viagra  Other Topics Concern   Not on file  Social History Narrative   Lives by himself    Lost 1 child    Social Determinants of Health   Financial Resource Strain: Not on file  Food Insecurity: Not on file  Transportation Needs: Not on file  Physical Activity: Not on file  Stress: Not on file  Social Connections: Not on file  Intimate Partner Violence: Not on file    ALLERGIES: Patient has no known allergies.  MEDICATIONS:  Current Outpatient Medications  Medication Sig Dispense Refill   Cholecalciferol (VITAMIN D) 50 MCG (2000 UT) CAPS Take 2,000 Units by mouth daily.     gabapentin (NEURONTIN) 100 MG capsule Take 1 capsule (100 mg total) by mouth at bedtime. 1 tablet for 3 nights, then 2 tablets for 3 nights, then 3 tablets every night 90 capsule 0   Ibuprofen 200 MG CAPS Take 200-400 mg by mouth every 6 (six) hours as needed. (Patient not taking: Reported on 04/30/2022)     Multiple Vitamin (MULTIVITAMIN WITH MINERALS) TABS tablet Take 1 tablet by mouth daily.     naproxen sodium (ALEVE) 220 MG tablet Take 1 tablet (220 mg total) by mouth 2 (two) times daily as needed. 60 tablet 4   sildenafil (VIAGRA) 100 MG tablet Take 100 mg by mouth every other day.     Vitamin D, Ergocalciferol, (DRISDOL) 1.25 MG (50000 UNIT) CAPS capsule Take 1 capsule (50,000 Units total) by mouth every 7 (seven) days. 12  capsule 0   No current facility-administered medications for this encounter.    REVIEW OF SYSTEMS:  On review of systems, the patient reports that he is doing well overall. He denies any chest pain, shortness of breath, cough, fevers, chills, night sweats, unintended weight changes. He denies any bowel disturbances, and denies abdominal pain, nausea or vomiting. He denies any new musculoskeletal or joint aches or pains. His IPSS was 16, indicating moderate urinary symptoms. His SHIM was 19, indicating he he has mild postoperative erectile dysfunction. A complete review of systems is obtained and is  otherwise negative.    PHYSICAL EXAM:  Wt Readings from Last 3 Encounters:  07/30/22 187 lb 6 oz (85 kg)  06/17/22 189 lb (85.7 kg)  04/30/22 187 lb (84.8 kg)   Temp Readings from Last 3 Encounters:  07/30/22 97.9 F (36.6 C) (Oral)  04/30/22 98.3 F (36.8 C) (Oral)  01/20/22 98.6 F (37 C) (Oral)   BP Readings from Last 3 Encounters:  07/30/22 134/82  06/17/22 127/87  04/30/22 (!) 142/84   Pulse Readings from Last 3 Encounters:  07/30/22 71  06/17/22 87  04/30/22 77    /10  In general this is a well appearing African-American man in no acute distress. He's alert and oriented x4 and appropriate throughout the examination. Cardiopulmonary assessment is negative for acute distress, and he exhibits normal effort.     KPS = 100  100 - Normal; no complaints; no evidence of disease. 90   - Able to carry on normal activity; minor signs or symptoms of disease. 80   - Normal activity with effort; some signs or symptoms of disease. 5   - Cares for self; unable to carry on normal activity or to do active work. 60   - Requires occasional assistance, but is able to care for most of his personal needs. 50   - Requires considerable assistance and frequent medical care. 49   - Disabled; requires special care and assistance. 23   - Severely disabled; hospital admission is indicated although  death not imminent. 47   - Very sick; hospital admission necessary; active supportive treatment necessary. 10   - Moribund; fatal processes progressing rapidly. 0     - Dead  Karnofsky DA, Abelmann [place], Craver LS and Burchenal [facility] [phone]) The use of the nitrogen mustards in the palliative treatment of carcinoma: with particular reference to bronchogenic carcinoma Cancer 1 [id number]  LABORATORY DATA:  Lab Results  Component Value Date   WBC 4.0 04/30/2022   HGB 15.0 04/30/2022   HCT 45.1 04/30/2022   MCV 91.3 04/30/2022   PLT 279.0 04/30/2022   Lab Results  Component Value Date   NA 140 07/30/2022   K 4.7 07/30/2022   CL 104 07/30/2022   CO2 28 07/30/2022   Lab Results  Component Value Date   ALT 24 04/30/2022   AST 18 04/30/2022   ALKPHOS 69 04/30/2022   BILITOT 0.8 04/30/2022     RADIOGRAPHY: NM PET (PSMA) SKULL TO MID THIGH  Result Date: 09/07/2022 CLINICAL DATA:  62 year old male status post prostatectomy 2019. Gleason 9 prostate adenocarcinoma. Elevated PSA equal 0.25. EXAM: NUCLEAR MEDICINE PET SKULL BASE TO THIGH TECHNIQUE: 9.9 mCi F18 Piflufolastat (Pylarify) was injected intravenously. Full-ring PET imaging was performed from the skull base to thigh after the radiotracer. CT data was obtained and used for attenuation correction and anatomic localization. COMPARISON:  None Available. FINDINGS: NECK No radiotracer activity in neck lymph nodes. Incidental CT finding: None. CHEST No radiotracer accumulation within mediastinal or hilar lymph nodes. No suspicious pulmonary nodules on the CT scan. Incidental CT finding: None. ABDOMEN/PELVIS Prostate: Post prostatectomy. There is asymmetric radiotracer activity in the LEFT aspect of the prostatectomy bed. There is no clear asymmetric soft tissue between the LEFT in the RIGHT pelvis, however the activity is fairly intense. For example SUV max equal 7.5 on image 192. Second focus radiotracer activity with SUV max equal 5.5 on the LEFT on  image 189. Lymph nodes: No measurable radiotracer avid lymph nodes in the pelvis. No radiotracer avid  lymph nodes outside of the pelvis in the periaortic retroperitoneum. Liver: No evidence of liver metastasis. Incidental CT finding: None. SKELETON No focal activity to suggest skeletal metastasis. IMPRESSION: 1. Cluster of radiotracer activity in the LEFT aspect of the prostatectomy bed is concerning for CT occult prostate cancer local recurrence. 2. No evidence of metastatic adenopathy outside the pelvis. 3. No visceral metastasis or skeletal metastasis. Electronically Signed   By: Suzy Bouchard M.D.   On: 09/07/2022 10:10      IMPRESSION/PLAN: 1. 62 y.o. gentleman with a biochemical recurrence of prostate cancer with a current PSA of 0.25 s/p RALP 04/2018 for Stage p(T3b, N0), Gleason 4+5 prostate cancer. Today, we reviewed the findings and workup thus far.  We discussed the natural history of prostate cancer.  We reviewed the the implications of positive margins, extracapsular extension, and seminal vesicle involvement on the risk of prostate cancer recurrence. In his case, extraprostatic extension and left seminal vesicle invasion were present at the time of surgery and now he has a rising postoperative PSA. We reviewed some of the evidence suggesting an advantage for patients who undergo salvage radiotherapy in this setting in terms of disease control and overall survival. We discussed radiation treatment directed to the prostatic fossa and pelvic lymph nodes with regard to the logistics and delivery of external beam radiation treatment. We also reviewed data from the [facility] trial regarding use of ADT in combination with salvage XRT to the prostate fossa and pelvic lymph nodes in the setting of biochemical recurrence.  This study shows that extending radiation therapy to the pelvic lymph nodes combined with adding short-term hormone therapy to standard treatment of the prostate surgical bed can extend the  amount of time before disease progression but does not appear to demonstrate an overall survival benefit.  Therefore, it is felt that the toxicities associated with ADT and the negative impact it has on quality of life may outweigh the potential small benefits of the treatment. Following a lengthy discussion of this topic, the patient prefers to avoid ADT at this time and only consider the use of ADT should his PSA continue to rise despite radiation alone.    At the conclusion of our conversation, the patient is interested in moving forward with the recommended 7.5 week course of daily external beam therapy without ADT. We will share our discussion with Dr. Alinda Money and proceed with treatment planning accordingly.  He is tentatively scheduled for CT simulation/treatment planning at 10 AM on Tuesday, September 17, 2022, in anticipation of beginning his daily treatments in the near future.  The patient appears to have a good understanding of his disease and our treatment recommendations which are of curative intent and is in agreement with the stated plan.  We enjoyed meeting with him again today and look forward to continuing to participate in his care.  He knows that he is welcome to call at anytime with any questions or concerns in the interim.  We personally spent 60 minutes in this encounter including chart review, reviewing radiological studies, meeting face-to-face with the patient, entering orders and completing documentation.   Nicholos Johns, PA-C    Tyler Pita, MD  [facility] Direct Dial: [phone]  Fax: [phone] [email or web address]  Skype  LinkedIn   This document serves as a record of services personally performed by Tyler Pita, MD and Freeman Caldron, PA-C. It was created on their behalf by Wilburn Mylar, a trained medical scribe. The creation of this record  is based on the scribe's personal observations and the provider's statements to them. This  document has been checked and approved by the attending provider.

## 2022-09-10 NOTE — Consult Note (Signed)
CC: Prostate Cancer   PCP: Dr. Kathlene November  Location of consult: [facility] - Prostate Cancer Multidisciplinary Clinic   Danny [name] is a healthy 62 year old gentleman who was initially diagnosed with prostate cancer in 2019 when he was found to have a PSA of 5.19 prompting a TRUS biopsy of the prostate that demonstrated Gleason 4+5=9 adenocarcinoma with 9 out of 12 biopsy cores positive for malignancy. He underwent conventional staging imaging at that time with a bone scan and CT scan that were negative for metastatic disease. He elected primary surgical therapy and underwent a UNS RAL radical prostatectomy and BPLND on 04/27/18. Pathology demonstrated a pT3b N0 Mx, Gleason 4+5=9 adenocarcinoma with negative surgical margins. His PSA was undetectable on ultrasensitive testing postoperatively but began increasing gradually and was confirmed to have biochemical recurrence in December 2023. He underwent a PSMA PET scan on 09/06/22. This demonstrated uptake on the left side of the prostatic fossa.     ALLERGIES: No Allergies    MEDICATIONS: Bimix (30 mg PAP, 1.0 mg PHE) 5 mL vial (Please provide needles, alcohol swabs, etc.). Use as directed.  Sildenafil Citrate 20 mg tablet 1 tablet PO PRN as directed  Trimix (10 mcg PGE, 30 mg PAP, 1 mg PHE) 5 mL vial (Please provide needles, alcohol swabs, etc.)  Viagra 100 mg tablet 1 tablet PO As Directed  Adult Aspirin 81 mg tablet, delayed release PRN  Multiple Vitamin  Naproxen 500 mg tablet  Sulfamethoxazole-Trimethoprim  Tadalafil 20 mg tablet 1 tablet PO Daily As Directed Prior to intercourse  Tylenol Extra Strength 500 mg tablet     GU PSH: Laparoscopy; Lymphadenectomy - 2019 Locm 300-'399Mg'$ /Ml Iodine,1Ml - 2019 Penile Injection - 2020, 2020 Prostate Needle Biopsy - 2019 Robotic Radical Prostatectomy - 2019     NON-GU PSH: Surgical Pathology, Gross And Microscopic Examination For Prostate Needle - 2019     GU PMH: ED due to arterial  insufficiency - 08/07/2022, - 05/03/2022, - 08/31/2021, - 01/02/2021, - 05/30/2020, - 2019 Prostate Cancer - 08/07/2022, - 05/03/2022, - 08/31/2021, - 01/02/2021, - 05/30/2020, - 2019 Peyronies Disease - 2020      PMH Notes:   1) Prostate cancer: He is s/p a UNS RAL radical prostatectomy and BPLND on 04/27/18.   Diagnosis: pT3b N0 Mx, Gleason 4+5=9 adenocarcinoma with multiple areas of EPE and SVI but negative surgical margins  Pretreatment PSA: 5.19  Pretreatment SHIM score: 1 (he did respond to PDE-5 inhibitors)   2) Erectile dysfunction: He did not respond to PDE-5 inhibitors after his radical prostatectomy. He began ICI in October 2020.   Current treatment: Trimix   NON-GU PMH: GERD    FAMILY HISTORY: Death of family member - Mother, Father Kidney Stones - Uncle, Father Lung Cancer - Father, [name], Grandfather Prostate Cancer - Uncle, Grandfather    Notes: 1 son (deceased); 1 daughter   SOCIAL HISTORY: Marital Status: Single Preferred Language: English; Ethnicity: Not Hispanic Or Latino; Race: Black or African American Current Smoking Status: Patient does not smoke anymore. Has not smoked since 12/18/2006. Smoked for 6 years.   Tobacco Use Assessment Completed: Used Tobacco in last 30 days? Does not use smokeless tobacco. Does drink.  Drinks 4+ caffeinated drinks per day. Patient's occupation Pharmacist, hospital.    REVIEW OF SYSTEMS:    GU Review Male:   Patient denies frequent urination, hard to postpone urination, burning/ pain with urination, get up at night to urinate, leakage of urine, stream starts and  stops, trouble starting your streams, and have to strain to urinate .  Gastrointestinal (Upper):   Patient denies nausea and vomiting.  Gastrointestinal (Lower):   Patient denies diarrhea and constipation.  Constitutional:   Patient denies fever, night sweats, weight loss, and fatigue.  Skin:   Patient denies skin rash/ lesion and itching.  Eyes:   Patient denies  blurred vision and double vision.  Ears/ Nose/ Throat:   Patient denies sore throat and sinus problems.  Hematologic/Lymphatic:   Patient denies swollen glands and easy bruising.  Cardiovascular:   Patient denies leg swelling and chest pains.  Respiratory:   Patient denies cough and shortness of breath.  Endocrine:   Patient denies excessive thirst.  Musculoskeletal:   Patient denies back pain and joint pain.  Neurological:   Patient denies dizziness and headaches.  Psychologic:   Patient denies depression and anxiety.   VITAL SIGNS: None   MULTI-SYSTEM PHYSICAL EXAMINATION:    Constitutional: Well-nourished. No physical deformities. Normally developed. Good grooming.     Complexity of Data:  Lab Test Review:   PSA  Records Review:   Previous Patient Records  X-Ray Review: PET- PSMA Scan: Reviewed Films.     07/31/22 07/31/22 05/03/22 04/29/22 11/26/21 08/24/21 04/04/21 12/26/20  PSA  Total PSA 0.25 ng/mL 0.25 ng/ml 0.16 ng/mL 0.20 ng/mL 0.12 ng/mL 0.10 ng/mL 0.095 ng/mL 0.084 ng/mL   Notes:                     CLINICAL DATA: 62 year old male status post prostatectomy 2019.  Gleason 9 prostate adenocarcinoma. Elevated PSA equal 0.25.   EXAM:  NUCLEAR MEDICINE PET SKULL BASE TO THIGH   TECHNIQUE:  9.9 mCi F18 Piflufolastat (Pylarify) was injected intravenously.  Full-ring PET imaging was performed from the skull base to thigh  after the radiotracer. CT data was obtained and used for attenuation  correction and anatomic localization.   COMPARISON: None Available.   FINDINGS:  NECK   No radiotracer activity in neck lymph nodes.   Incidental CT finding: None.   CHEST   No radiotracer accumulation within mediastinal or hilar lymph nodes.  No suspicious pulmonary nodules on the CT scan.   Incidental CT finding: None.   ABDOMEN/PELVIS   Prostate: Post prostatectomy. There is asymmetric radiotracer  activity in the LEFT aspect of the prostatectomy bed. There is no   clear asymmetric soft tissue between the LEFT in the RIGHT pelvis,  however the activity is fairly intense. For example SUV max equal  7.5 on image 192. Second focus radiotracer activity with SUV max  equal 5.5 on the LEFT on image 189.   Lymph nodes: No measurable radiotracer avid lymph nodes in the  pelvis. No radiotracer avid lymph nodes outside of the pelvis in the  periaortic retroperitoneum..   Liver: No evidence of liver metastasis.   Incidental CT finding: None.   SKELETON   No focal activity to suggest skeletal metastasis.   IMPRESSION:  1. Cluster of radiotracer activity in the LEFT aspect of the  prostatectomy bed is concerning for CT occult prostate cancer local  recurrence.  2. No evidence of metastatic adenopathy outside the pelvis.  3. No visceral metastasis or skeletal metastasis.    Electronically Signed  By: Suzy Bouchard M.D.  On: 09/07/2022 10:10   PROCEDURES: None   ASSESSMENT:      ICD-10 Details  1 GU:   Prostate Cancer - C61    PLAN:  Schedule Labs: 6 Months - PSA    6 Months - Urinalysis  Return Visit/Planned Activity: 6 Months - Office Visit          Document Letter(s):  Created for Patient: Clinical Summary         Notes:   1. Biochemically recurrent prostate cancer: I did detailed discussion with Danny [name] and he was seen by Dr. Tammi Klippel as well today. After reviewing options for management, he has made the decision to proceed with salvage radiation therapy without androgen deprivation therapy. Based on his PSMA PET scan results which we reviewed today, his situation is consistent with a local recurrence. He plans to proceed in the next 1 to 2 weeks with simulation followed by radiation for the majority of February and March. I will plan to schedule him for an appointment with me in the summer to check his first post radiotherapy PSA. We have reviewed the potential risks associate with salvage radiation therapy. All questions were  answered to his stated satisfaction.   CC: Dr. Kathlene November  Dr. Tyler Pita

## 2022-09-11 ENCOUNTER — Encounter: Payer: Self-pay | Admitting: Genetic Counselor

## 2022-09-11 DIAGNOSIS — Z8042 Family history of malignant neoplasm of prostate: Secondary | ICD-10-CM | POA: Insufficient documentation

## 2022-09-11 NOTE — Progress Notes (Addendum)
REFERRING PROVIDER: Tyler Pita, MD  PRIMARY PROVIDER:  Colon Branch, MD  PRIMARY REASON FOR VISIT:  1. Malignant neoplasm of prostate ([place])   2. Family history of prostate cancer    HISTORY OF PRESENT ILLNESS:   Danny [name], a 62 y.o. male, was seen for a [place] cancer genetics consultation at the request of Dr. Tammi Klippel due to a personal and family history of cancer.  Danny [name] presents to clinic today to discuss the possibility of a hereditary predisposition to cancer, to discuss genetic testing, and to further clarify his future cancer risks, as well as potential cancer risks for family members.   Danny [name] was diagnosed with prostate cancer at age 34, Gleason score 9.  CANCER HISTORY:  Oncology History  Malignant neoplasm of prostate ([place])  02/03/2018 Cancer Staging   Staging form: Prostate, AJCC 8th Edition - Clinical stage from 02/03/2018: Stage IIIC (cT1c, cN0, cM0, PSA: 5.1, Grade Group: 5) - Signed by Freeman Caldron, PA-C on 09/10/2022 Histopathologic type: Adenocarcinoma, NOS Stage prefix: Initial diagnosis Prostate specific antigen (PSA) range: Less than 10 Gleason primary pattern: 4 Gleason secondary pattern: 5 Gleason score: 9 Histologic grading system: 5 grade system Number of biopsy cores examined: 12 Number of biopsy cores positive: 9 Location of positive needle core biopsies: Both sides   02/26/2018 Initial Diagnosis   Malignant neoplasm of prostate ([place])   04/27/2018 Cancer Staging   Staging form: Prostate, AJCC 8th Edition - Pathologic stage from 04/27/2018: Stage IIIC (pT3b, pN0, cM0, PSA: 5.2, Grade Group: 5) - Signed by Freeman Caldron, PA-C on 09/10/2022 Histopathologic type: Adenocarcinoma, NOS Stage prefix: Initial diagnosis Prostate specific antigen (PSA) range: Less than 10 Gleason primary pattern: 4 Gleason secondary pattern: 5 Gleason score: 9 Histologic grading system: 5 grade system Residual tumor (R): R0 - None      Past Medical  History:  Diagnosis Date   Abnormal electrocardiogram    Abnormal glucose    Anxiety    Cigarette smoker    former smoker- none now    Erectile dysfunction    GERD (gastroesophageal reflux disease)    Paresthesia    Prostate cancer [facility])     Past Surgical History:  Procedure Laterality Date   LYMPHADENECTOMY Bilateral 04/27/2018   Procedure: LYMPHADENECTOMY, BILATERAL PELVIC;  Surgeon: Raynelle Bring, MD;  Location: [facility];  Service: Urology;  Laterality: Bilateral;   PROSTATE BIOPSY     ROBOT ASSISTED LAPAROSCOPIC RADICAL PROSTATECTOMY N/A 04/27/2018   Procedure: XI ROBOTIC ASSISTED LAPAROSCOPIC RADICAL PROSTATECTOMY LEVEL 2;  Surgeon: Raynelle Bring, MD;  Location: [facility];  Service: Urology;  Laterality: N/A;    Social History   Socioeconomic History   Marital status: Single    Spouse name: Not on file   Number of children: 2   Years of education: Not on file   Highest education level: Not on file  Occupational History   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  Tobacco Use   Smoking status: Former    Packs/day: 0.50    Years: 10.00    Total pack years: 5.00    Types: Cigarettes    Quit date: 08/20/2007    Years since quitting: 15.0   Smokeless tobacco: Never   Tobacco comments:    2009  Vaping Use   Vaping Use: Never used  Substance and Sexual Activity   Alcohol use: Yes    Alcohol/week: 3.0 - 5.0 standard drinks of alcohol    Types: 2 Cans of beer, 1 -  3 Standard drinks or equivalent per week    Comment: beer -weekends   Drug use: No   Sexual activity: Yes    Comment: with the aid of Viagra  Other Topics Concern   Not on file  Social History Narrative   Lives by himself    Lost 1 child    Social Determinants of Health   Financial Resource Strain: Not on file  Food Insecurity: Not on file  Transportation Needs: Not on file  Physical Activity: Not on file  Stress: Not on file  Social Connections: Not on file     FAMILY HISTORY:  We obtained a  detailed, 4-generation family history.  Significant diagnoses are listed below: Family History  Problem Relation Age of Onset   Lung cancer Father 74       smoker   Leukemia Brother    Prostate cancer Maternal Uncle        metastatic   Prostate cancer Maternal Uncle    Prostate cancer Maternal Grandfather    Colon cancer Paternal Grandmother 33 - 68   Lung cancer Paternal Grandfather        smoked   CAD Neg Hx    Stroke Neg Hx    Diabetes Neg Hx    Colon polyps Neg Hx        Danny [name] has 7 maternal uncles. Two maternal uncles were diagnosed with prostate cancer, one of the two died due to metastatic prostate cancer. His maternal grandfather was diagnosed with prostate cancer, he is deceased. His father was diagnosed with lung cancer at age 36, he smoked and died at age 60. His paternal grandfather was diagnosed with lung cancer, he smoked. His paternal grandmother was diagnosed with colon cancer in her 62s, she died at age 14. His brother died due to leukemia at age 53. Danny [name] is unaware of previous family history of genetic testing for hereditary cancer risks.   GENETIC COUNSELING ASSESSMENT: Danny [name] is a 62 y.o. male with a personal and family history of cancer which is somewhat suggestive of a hereditary predisposition to cancer given his high risk prostate cancer and family history of prostate cancer. We, therefore, discussed and recommended the following at today's visit.   DISCUSSION: We discussed that 5 - 10% of cancer is hereditary, with most cases of prostate cancer associated with BRCA1/2. There are other genes that can be associated with hereditary prostate cancer syndromes. We discussed that testing is beneficial for several reasons including knowing how to follow individuals after completing their treatment, identifying whether potential treatment options would be beneficial, and understanding if other family members could be at risk for cancer and allowing them to  undergo genetic testing.   We reviewed the characteristics, features and inheritance patterns of hereditary cancer syndromes. We also discussed genetic testing, including the appropriate family members to test, the process of testing, insurance coverage and turn-around-time for results. We discussed the implications of a negative, positive, carrier and/or variant of uncertain significant result. We recommended Danny [name] pursue genetic testing for a panel that includes genes associated with prostate and colon cancer.   Danny [name] elected to have [facility] Common Hereditary Cancer Panel. The Common Hereditary Cancers Panel offered by [facility] includes sequencing and/or deletion duplication testing of the following 48 genes: APC, ATM, AXIN2, BAP1, BARD1, BMPR1A, BRCA1, BRCA2, BRIP1, CDH1, CDK4, CDKN2A (p14ARF and p16INK4a only), CHEK2, CTNNA1, DICER1, EPCAM (Deletion/duplication testing only), FH, GREM1 (promoter region duplication testing only), HOXB13, KIT, MBD4, MEN1,  MLH1, MSH2, MSH3, MSH6, MUTYH, NF1, NHTL1, PALB2, PDGFRA, PMS2, POLD1, POLE, PTEN, RAD51C, RAD51D, SDHA (sequencing analysis only except exon 14), SDHB, SDHC, SDHD, SMAD4, SMARCA4. STK11, TP53, TSC1, TSC2, and VHL.  Based on Danny [name]'s personal and family history of cancer, he meets medical criteria for genetic testing. Despite that he meets criteria, he may still have an out of pocket cost. We discussed that if his out of pocket cost for testing is over $100, the laboratory will call and confirm whether he wants to proceed with testing.  If the out of pocket cost of testing is less than $100 he will be billed by the genetic testing laboratory.   PLAN: After considering the risks, benefits, and limitations, Danny [name] provided informed consent to pursue genetic testing and the blood sample was sent to [facility] for analysis of the Common Cancer Panel. Results should be available within approximately 2-3 weeks' time, at which point they  will be disclosed by telephone to Danny [name], as will any additional recommendations warranted by these results. Danny [name] will receive a summary of his genetic counseling visit and a copy of his results once available. This information will also be available in Epic.   Danny [name] questions were answered to his satisfaction today. Our contact information was provided should additional questions or concerns arise. Thank you for the referral and allowing [place] to share in the care of your patient.   Lucille Passy, MS, [facility] Genetic Counselor [email or web address] (P) [phone]  The patient was seen for a total of 20 minutes in face-to-face genetic counseling.  Drs. Lindi Adie and/or Burr Medico were available to discuss this case as needed.   _______________________________________________________________________ For Office Staff:  Number of people involved in session: 2 Was an Intern/ student involved with case: no

## 2022-09-16 NOTE — Progress Notes (Signed)
Patient presented to the [facility] on 09/10/22 for his biochemical recurrence of prostate cancer with a current PSA of 0.25 s/p RALP 04/2018 for Stage p(T3b, N0), Gleason 4+5 prostate cancer.   Patient decided to proceed with 7.5 weeks of radiation without ADT.  CT Simulation is scheduled for 09/17/22.   RN spoke with patient, patient aware of upcoming appointment.   All questions were answered. No barriers identified at this time.  Will continue to follow.   Plan of care in progress.

## 2022-09-17 ENCOUNTER — Ambulatory Visit
Admission: RE | Admit: 2022-09-17 | Discharge: 2022-09-17 | Disposition: A | Discharge status: Home / Self Care | Payer: 59 | Source: Ambulatory Visit | Attending: Radiation Oncology | Admitting: Radiation Oncology

## 2022-09-17 ENCOUNTER — Other Ambulatory Visit: Payer: Self-pay

## 2022-09-17 DIAGNOSIS — C61 Malignant neoplasm of prostate: Secondary | ICD-10-CM | POA: Diagnosis present

## 2022-09-17 NOTE — Progress Notes (Signed)
  Radiation Oncology         [phone] ________________________________  Name: Danny [name] MRN: [id number]  Date: 09/17/2022  DOB: 14-Jun-1961  SIMULATION AND TREATMENT PLANNING NOTE    ICD-10-CM   1. Malignant neoplasm of prostate ([place])  C61       DIAGNOSIS:  62 y.o. gentleman with a biochemical recurrence of prostate cancer with a current PSA of 0.25 s/p RALP 04/2018 for Stage p(T3b, N0), Gleason 4+5 prostate cancer.  NARRATIVE:  The patient was brought to the [facility].  Identity was confirmed.  All relevant records and images related to the planned course of therapy were reviewed.  The patient freely provided informed written consent to proceed with treatment after reviewing the details related to the planned course of therapy. The consent form was witnessed and verified by the simulation staff.  Then, the patient was set-up in a stable reproducible supine position for radiation therapy.  A vacuum lock pillow device was custom fabricated to position his legs in a reproducible immobilized position.  Then, I performed a urethrogram under sterile conditions to identify the prostatic bed.  CT images were obtained.  Surface markings were placed.  The CT images were loaded into the planning software.  Then the prostate bed target, pelvic lymph node target and avoidance structures including the rectum, bladder, bowel and hips were contoured.  Treatment planning then occurred.  The radiation prescription was entered and confirmed.  A total of one complex treatment devices were fabricated. I have requested : Intensity Modulated Radiotherapy (IMRT) is medically necessary for this case for the following reason:  Rectal sparing.Marland Kitchen  PLAN:  The patient will receive 45 Gy in 25 fractions of 1.8 Gy, followed by a boost to the prostate bed to a total dose of 68.4 Gy with 13 additional fractions of 1.8 Gy with simultaneous integrated boost of 2.2 Gy per fraction to PET-avid prostatic fossa  region based on fused PET.   ________________________________  Sheral Apley Tammi Klippel, M.D.

## 2022-09-20 ENCOUNTER — Telehealth: Payer: Self-pay | Admitting: Genetic Counselor

## 2022-09-20 ENCOUNTER — Ambulatory Visit (INDEPENDENT_AMBULATORY_CARE_PROVIDER_SITE_OTHER): Payer: 59 | Admitting: Neurology

## 2022-09-20 ENCOUNTER — Encounter: Payer: Self-pay | Admitting: Genetic Counselor

## 2022-09-20 ENCOUNTER — Ambulatory Visit: Payer: 59 | Admitting: Neurology

## 2022-09-20 VITALS — BP 133/88 | HR 56 | Ht 67.0 in | Wt 189.0 lb

## 2022-09-20 DIAGNOSIS — Z1379 Encounter for other screening for genetic and chromosomal anomalies: Secondary | ICD-10-CM | POA: Insufficient documentation

## 2022-09-20 DIAGNOSIS — G5603 Carpal tunnel syndrome, bilateral upper limbs: Secondary | ICD-10-CM

## 2022-09-20 DIAGNOSIS — R200 Anesthesia of skin: Secondary | ICD-10-CM

## 2022-09-20 NOTE — Progress Notes (Signed)
ASSESSMENT AND PLAN  Danny [name] is a 62 y.o. male   Bilateral carpal tunnel syndrome  Moderate on the right, mild on the left side,  Suggested wrist splint,  NSAIDs as needed, Voltaren gel, gabapentin as needed  DIAGNOSTIC DATA (LABS, IMAGING, TESTING) - I reviewed patient records, labs, notes, testing and imaging myself where available.   MEDICAL HISTORY:  Danny [name], Danny [name] a 62 year old right hand male,  seen in request by Dr. Leta Baptist for electrodiagnostic study of bilateral hands paresthesia, his primary care physician is Dr. Larose Kells, [place].   I reviewed and summarized the referring note. PMHx Prostate Cancer, s/p proctectomy  In summer 2023, he did a lot of home projects, including lying on his back, wearing his crawl space, lifting up both hands to hold a heavy object, 4 days later, he noticed bilateral hands paresthesia, most discomfort was then, but symptoms persistent, now he will often wake up with bilateral hands numbness from sleep, he has to shake his hands, paresthesia can extend above elbow, he denies neck pain, no upper extremity weakness, no bilateral feet paresthesia or gait abnormality  PHYSICAL EXAM:   Vitals:   09/20/22 1057  Weight: 189 lb (85.7 kg)  Height: '5\' 7"'$  (1.702 m)     PHYSICAL EXAMNIATION:  Gen: NAD, conversant, well nourised, well groomed                     Cardiovascular: Regular rate rhythm, no peripheral edema, warm, nontender. Eyes: Conjunctivae clear without exudates or hemorrhage Neck: Supple, no carotid bruits. Pulmonary: Clear to auscultation bilaterally   NEUROLOGICAL EXAM:  MENTAL STATUS: Speech/cognition: Awake, alert, oriented to history taking and casual conversation CRANIAL NERVES: CN II: Visual fields are full to confrontation. Pupils are round equal and briskly reactive to light. CN III, IV, VI: extraocular movement are normal. No ptosis. CN V: Facial sensation is intact to light touch CN VII: Face is symmetric  with normal eye closure  CN VIII: Hearing is normal to causal conversation. CN IX, X: Phonation is normal. CN XI: Head turning and shoulder shrug are intact  MOTOR: There is no pronator drift of out-stretched arms. Muscle bulk and tone are normal. Muscle strength is normal.  REFLEXES: Reflexes are 2+ and symmetric at the biceps, triceps, knees, and ankles. Plantar responses are flexor.  SENSORY: Intact to light touch, pinprick and vibratory sensation are intact in fingers and toes.  COORDINATION: There is no trunk or limb dysmetria noted.  GAIT/STANCE: Posture is normal. Gait is    REVIEW OF SYSTEMS:  Full 14 system review of systems performed and notable only for as above All other review of systems were negative.   ALLERGIES: No Known Allergies  HOME MEDICATIONS: Current Outpatient Medications  Medication Sig Dispense Refill   Cholecalciferol (VITAMIN D) 50 MCG (2000 UT) CAPS Take 2,000 Units by mouth daily.     gabapentin (NEURONTIN) 100 MG capsule Take 1 capsule (100 mg total) by mouth at bedtime. 1 tablet for 3 nights, then 2 tablets for 3 nights, then 3 tablets every night 90 capsule 0   Ibuprofen 200 MG CAPS Take 200-400 mg by mouth every 6 (six) hours as needed. (Patient not taking: Reported on 04/30/2022)     Multiple Vitamin (MULTIVITAMIN WITH MINERALS) TABS tablet Take 1 tablet by mouth daily.     naproxen sodium (ALEVE) 220 MG tablet Take 1 tablet (220 mg total) by mouth 2 (two) times daily as  needed. 60 tablet 4   sildenafil (VIAGRA) 100 MG tablet Take 100 mg by mouth every other day.     Vitamin D, Ergocalciferol, (DRISDOL) 1.25 MG (50000 UNIT) CAPS capsule Take 1 capsule (50,000 Units total) by mouth every 7 (seven) days. 12 capsule 0   No current facility-administered medications for this visit.    PAST MEDICAL HISTORY: Past Medical History:  Diagnosis Date   Abnormal electrocardiogram    Abnormal glucose    Anxiety    Cigarette smoker    former smoker-  none now    Erectile dysfunction    GERD (gastroesophageal reflux disease)    Paresthesia    Prostate cancer ([place])     PAST SURGICAL HISTORY: Past Surgical History:  Procedure Laterality Date   LYMPHADENECTOMY Bilateral 04/27/2018   Procedure: LYMPHADENECTOMY, BILATERAL PELVIC;  Surgeon: Raynelle Bring, MD;  Location: [facility];  Service: Urology;  Laterality: Bilateral;   PROSTATE BIOPSY     ROBOT ASSISTED LAPAROSCOPIC RADICAL PROSTATECTOMY N/A 04/27/2018   Procedure: XI ROBOTIC ASSISTED LAPAROSCOPIC RADICAL PROSTATECTOMY LEVEL 2;  Surgeon: Raynelle Bring, MD;  [facility];  Service: Urology;  Laterality: N/A;    FAMILY HISTORY: Family History  Problem Relation Age of Onset   Lung cancer Father 32       smoker   Leukemia Brother    Prostate cancer Maternal Uncle        metastatic   Prostate cancer Maternal Uncle    Prostate cancer Maternal Grandfather    Colon cancer Paternal Grandmother 31 - 31   Lung cancer Paternal Grandfather        smoked   CAD Neg Hx    Stroke Neg Hx    Diabetes Neg Hx    Colon polyps Neg Hx     SOCIAL HISTORY: Social History   Socioeconomic History   Marital status: Single    Spouse name: Not on file   Number of children: 2   Years of education: Not on file   Highest education level: Not on file  Occupational History   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  Tobacco Use   Smoking status: Former    Packs/day: 0.50    Years: 10.00    Total pack years: 5.00    Types: Cigarettes    Quit date: 08/20/2007    Years since quitting: 15.0   Smokeless tobacco: Never   Tobacco comments:    2009  Vaping Use   Vaping Use: Never used  Substance and Sexual Activity   Alcohol use: Yes    Alcohol/week: 3.0 - 5.0 standard drinks of alcohol    Types: 2 Cans of beer, 1 - 3 Standard drinks or equivalent per week    Comment: beer -weekends   Drug use: No   Sexual activity: Yes    Comment: with the aid of Viagra  Other Topics Concern   Not  on file  Social History Narrative   Lives by himself    Lost 1 child    Social Determinants of Health   Financial Resource Strain: Not on file  Food Insecurity: Not on file  Transportation Needs: Not on file  Physical Activity: Not on file  Stress: Not on file  Social Connections: Not on file  Intimate Partner Violence: Not on file      Marcial Pacas, M.D. Ph.D.  [facility] [address] [place], [place] [address] Ph: [phone] Fax: [phone]  CC:  Colon Branch, MD [phone]  [address] [place],  [place] [address]  Colon Branch, MD

## 2022-09-20 NOTE — Telephone Encounter (Signed)
I contacted Mr. Zawadzki to discuss his genetic testing results. No pathogenic variants were identified in the 48 genes analyzed. Detailed clinic note to follow.  The test report has been scanned into EPIC and is located under the Molecular Pathology section of the Results Review tab.  A portion of the result report is included below for reference.   Lucille Passy, MS, [facility] Genetic Counselor [email or web address] (P) [phone]

## 2022-09-20 NOTE — Procedures (Addendum)
Full Name: Rocco Kerkhoff Gender: Male MRN #: [id number] Date of Birth: [birth year]    Visit Date: 09/20/2022 11:01 Age: 62 Years Examining Physician: Dr. Marcial Pacas Referring Physician: Dr. Andrey Spearman Height: 5 feet 7 inch History: 62 year old male presenting with intermittent bilateral hands paresthesia  Summary of the test:  Nerve conduction study: Bilateral ulnar sensory and motor responses were normal.  Bilateral median sensory response showed mildly to moderately prolonged peak latency with mildly decreased snap amplitude.  Bilateral median motor responses showed borderline distal latency, mildly prolonged on the right side.  Bilateral tibial H reflex were present and symmetric.  Electromyography: Selected needle examination of the right upper extremity muscles and right cervical paraspinal showed no significant abnormality with exception of mildly decreased recruitment pattern at right abductor pollicis brevis, there was no spontaneous activity noted.  Conclusion: This is an abnormal study.  There is electrodiagnostic evidence of bilateral median neuropathy across the wrist, right side is moderate, left side is mild.  There is no evidence of right cervical radiculopathy.    ------------------------------- Marcial Pacas, M.D. Ph.D.  [facility] [address], [place], [place] [address] Tel: [phone] Fax: [phone]  Verbal informed consent was obtained from the patient, patient was informed of potential risk of procedure, including bruising, bleeding, hematoma formation, infection, muscle weakness, muscle pain, numbness, among others.        [place]    Nerve / Sites Muscle Latency Ref. Amplitude Ref. Rel Amp Segments Distance Velocity Ref. Area    ms ms mV mV %  cm m/s m/s mVms  R Median - APB     Wrist APB 4.5 ?4.4 3.7 ?4.0 100 Wrist - APB 7   14.4     Upper arm APB 9.0  3.3  89.1 Upper arm - Wrist 23 51 ?49 12.5  L Median - APB      Wrist APB 4.3 ?4.4 4.0 ?4.0 100 Wrist - APB 7   12.7     Upper arm APB 9.1  4.9  124 Upper arm - Wrist 25 52 ?49 16.1  R Ulnar - ADM     Wrist ADM 2.6 ?3.3 10.8 ?6.0 100 Wrist - ADM 7   32.0     B.Elbow ADM 4.7  10.8  100 B.Elbow - Wrist 12 56 ?49 30.0     A.Elbow ADM 8.0  10.7  98.6 A.Elbow - B.Elbow 19 58 ?49 29.8  L Ulnar - ADM     Wrist ADM 2.9 ?3.3 8.3 ?6.0 100 Wrist - ADM 7   23.8     B.Elbow ADM 5.3  7.8  94.8 B.Elbow - Wrist 13 54 ?49 23.3     A.Elbow ADM 9.0  6.7  86.1 A.Elbow - B.Elbow 19 52 ?49 23.3             SNC    Nerve / Sites Rec. Site Peak Lat Ref.  Amp Ref. Segments Distance    ms ms V V  cm  R Median - Orthodromic (Dig II, Mid palm)     Dig II Wrist 4.2 ?3.4 4 ?10 Dig II - Wrist 13  L Median - Orthodromic (Dig II, Mid palm)     Dig II Wrist 4.1 ?3.4 6 ?10 Dig II - Wrist 13  R Ulnar - Orthodromic, (Dig V, Mid palm)     Dig V Wrist 3.1 ?3.1 9 ?5 Dig V - Wrist 11  L Ulnar - Orthodromic, (  Dig V, Mid palm)     Dig V Wrist 3.0 ?3.1 7 ?5 Dig V - Wrist 76               F  Wave    Nerve F Lat Ref.   ms ms  R Ulnar - ADM 28.8 ?32.0  L Ulnar - ADM 31.8 ?32.0         H Reflex    Nerve H Lat Lat Hmax   ms ms   Left Right Ref. Left Right Ref.  Tibial - [place] 39.3 39.5 ?35.0 33.4 32.7 ?35.0         EMG Summary Table    Spontaneous MUAP Recruitment  Muscle IA Fib PSW Fasc Other Amp Dur. Poly Pattern  R. Abductor pollicis brevis Normal None None None _______ Normal Normal Normal Reduced  R. First dorsal interosseous Normal None None None _______ Normal Normal Normal Normal  R. Pronator teres Normal None None None _______ Normal Normal Normal Normal  R. Biceps brachii Normal None None None _______ Normal Normal Normal Normal  R. Deltoid Normal None None None _______ Normal Normal Normal Normal  R. Triceps brachii Normal None None None _______ Normal Normal Normal Normal  R. Cervical paraspinals Normal None None None _______ Normal Normal Normal Normal

## 2022-09-23 ENCOUNTER — Ambulatory Visit: Payer: Self-pay | Admitting: Genetic Counselor

## 2022-09-23 DIAGNOSIS — Z1379 Encounter for other screening for genetic and chromosomal anomalies: Secondary | ICD-10-CM

## 2022-09-23 NOTE — Progress Notes (Signed)
HPI:   Danny [name] was previously seen in the [place] [facility] due to a personal and family history of cancer and concerns regarding a hereditary predisposition to cancer. Please refer to our prior cancer genetics clinic note for more information regarding our discussion, assessment and recommendations, at the time. Danny [name] recent genetic test results were disclosed to him, as were recommendations warranted by these results. These results and recommendations are discussed in more detail below.  CANCER HISTORY:  Oncology History  Malignant neoplasm of prostate ([place])  02/03/2018 Cancer Staging   Staging form: Prostate, AJCC 8th Edition - Clinical stage from 02/03/2018: Stage IIIC (cT1c, cN0, cM0, PSA: 5.1, Grade Group: 5) - Signed by Freeman Caldron, PA-C on 09/10/2022 Histopathologic type: Adenocarcinoma, NOS Stage prefix: Initial diagnosis Prostate specific antigen (PSA) range: Less than 10 Gleason primary pattern: 4 Gleason secondary pattern: 5 Gleason score: 9 Histologic grading system: 5 grade system Number of biopsy cores examined: 12 Number of biopsy cores positive: 9 Location of positive needle core biopsies: Both sides   02/26/2018 Initial Diagnosis   Malignant neoplasm of prostate ([place])   04/27/2018 Cancer Staging   Staging form: Prostate, AJCC 8th Edition - Pathologic stage from 04/27/2018: Stage IIIC (pT3b, pN0, cM0, PSA: 5.2, Grade Group: 5) - Signed by Freeman Caldron, PA-C on 09/10/2022 Histopathologic type: Adenocarcinoma, NOS Stage prefix: Initial diagnosis Prostate specific antigen (PSA) range: Less than 10 Gleason primary pattern: 4 Gleason secondary pattern: 5 Gleason score: 9 Histologic grading system: 5 grade system Residual tumor (R): R0 - None     FAMILY HISTORY:  We obtained a detailed, 4-generation family history.  Significant diagnoses are listed below:      Family History  Problem Relation Age of Onset   Lung cancer Father 54         smoker   Leukemia Brother     Prostate cancer Maternal Uncle          metastatic   Prostate cancer Maternal Uncle     Prostate cancer Maternal Grandfather     Colon cancer Paternal Grandmother 58 - 64   Lung cancer Paternal Grandfather          smoked   CAD Neg Hx     Stroke Neg Hx     Diabetes Neg Hx     Colon polyps Neg Hx             Danny [name] has 7 maternal uncles. Two maternal uncles were diagnosed with prostate cancer, one of the two died due to metastatic prostate cancer. His maternal grandfather was diagnosed with prostate cancer, he is deceased. His father was diagnosed with lung cancer at age 65, he smoked and died at age 79. His paternal grandfather was diagnosed with lung cancer, he smoked. His paternal grandmother was diagnosed with colon cancer in her 64s, she died at age 63. His brother died due to leukemia at age 68. Danny [name] is unaware of previous family history of genetic testing for hereditary cancer risks.   GENETIC TEST RESULTS:  The [facility] Common Cancer Panel found no pathogenic mutations.  The Common Hereditary Cancers Panel offered by [facility] includes sequencing and/or deletion duplication testing of the following 48 genes: APC, ATM, AXIN2, BAP1, BARD1, BMPR1A, BRCA1, BRCA2, BRIP1, CDH1, CDK4, CDKN2A (p14ARF and p16INK4a only), CHEK2, CTNNA1, DICER1, EPCAM (Deletion/duplication testing only), FH, GREM1 (promoter region duplication testing only), HOXB13, KIT, MBD4, MEN1, MLH1, MSH2, MSH3, MSH6, MUTYH, NF1, NHTL1, PALB2, PDGFRA, PMS2, POLD1, POLE,  PTEN, RAD51C, RAD51D, SDHA (sequencing analysis only except exon 14), SDHB, SDHC, SDHD, SMAD4, SMARCA4. STK11, TP53, TSC1, TSC2, and VHL.   The test report has been scanned into EPIC and is located under the Molecular Pathology section of the Results Review tab.  A portion of the result report is included below for reference. Genetic testing reported out on 09/19/2022.      Even though a pathogenic variant was not  identified, possible explanations for the cancer in the family may include: There may be no hereditary risk for cancer in the family. The cancers in Danny [name] and/or his family may be due to other genetic or environmental factors. There may be a gene mutation in one of these genes that current testing methods cannot detect, but that chance is small. There could be another gene that has not yet been discovered, or that we have not yet tested, that is responsible for the cancer diagnoses in the family.   Therefore, it is important to remain in touch with cancer genetics in the future so that we can continue to offer Danny [name] the most up to date genetic testing.   ADDITIONAL GENETIC TESTING:  We discussed with Danny [name] that his genetic testing was fairly extensive.  If there are genes identified to increase cancer risk that can be analyzed in the future, we would be happy to discuss and coordinate this testing at that time.      CANCER SCREENING RECOMMENDATIONS:  Danny [name] test result is considered negative (normal).  This means that we have not identified a hereditary cause for his personal and family history of cancer at this time.   An individual's cancer risk and medical management are not determined by genetic test results alone. Overall cancer risk assessment incorporates additional factors, including personal medical history, family history, and any available genetic information that may result in a personalized plan for cancer prevention and surveillance. Therefore, it is recommended he continue to follow the cancer management and screening guidelines provided by his oncology and primary healthcare provider.  RECOMMENDATIONS FOR FAMILY MEMBERS:   Since he did not inherit a mutation in a cancer predisposition gene included on this panel, his daughter could not have inherited a mutation from him in one of these genes.   FOLLOW-UP:  Cancer genetics is a rapidly advancing field and it is  possible that new genetic tests will be appropriate for him and/or his family members in the future. We encouraged him to remain in contact with cancer genetics on an annual basis so we can update his personal and family histories and let him know of advances in cancer genetics that may benefit this family.   Our contact number was provided. Danny [name] questions were answered to his satisfaction, and he knows he is welcome to call us at anytime with additional questions or concerns.   Lucille Passy, MS, [facility] Genetic Counselor [email or web address] [phone]) [phone]

## 2022-09-25 DIAGNOSIS — C61 Malignant neoplasm of prostate: Secondary | ICD-10-CM | POA: Insufficient documentation

## 2022-09-26 ENCOUNTER — Ambulatory Visit
Admission: RE | Admit: 2022-09-26 | Discharge: 2022-09-26 | Disposition: A | Discharge status: Home / Self Care | Payer: 59 | Source: Ambulatory Visit | Attending: Radiation Oncology | Admitting: Radiation Oncology

## 2022-09-26 ENCOUNTER — Other Ambulatory Visit: Payer: Self-pay

## 2022-09-26 DIAGNOSIS — C61 Malignant neoplasm of prostate: Secondary | ICD-10-CM | POA: Diagnosis not present

## 2022-09-26 LAB — RAD ONC ARIA SESSION SUMMARY
Course Elapsed Days: 0
Plan Fractions Treated to Date: 1
Plan Prescribed Dose Per Fraction: 1.8 Gy
Plan Total Fractions Prescribed: 25
Plan Total Prescribed Dose: 45 Gy
Reference Point Dosage Given to Date: 1.8 Gy
Reference Point Session Dosage Given: 1.8 Gy
Session Number: 1

## 2022-09-27 ENCOUNTER — Encounter: Payer: Self-pay | Admitting: Genetic Counselor

## 2022-09-27 ENCOUNTER — Other Ambulatory Visit: Payer: Self-pay

## 2022-09-27 ENCOUNTER — Ambulatory Visit
Admission: RE | Admit: 2022-09-27 | Discharge: 2022-09-27 | Disposition: A | Discharge status: Home / Self Care | Payer: 59 | Source: Ambulatory Visit | Attending: Radiation Oncology | Admitting: Radiation Oncology

## 2022-09-27 DIAGNOSIS — C61 Malignant neoplasm of prostate: Secondary | ICD-10-CM | POA: Diagnosis not present

## 2022-09-27 LAB — RAD ONC ARIA SESSION SUMMARY
Course Elapsed Days: 1
Plan Fractions Treated to Date: 2
Plan Prescribed Dose Per Fraction: 1.8 Gy
Plan Total Fractions Prescribed: 25
Plan Total Prescribed Dose: 45 Gy
Reference Point Dosage Given to Date: 3.6 Gy
Reference Point Session Dosage Given: 1.8 Gy
Session Number: 2

## 2022-09-30 ENCOUNTER — Ambulatory Visit
Admission: RE | Admit: 2022-09-30 | Discharge: 2022-09-30 | Disposition: A | Discharge status: Home / Self Care | Payer: 59 | Source: Ambulatory Visit | Attending: Radiation Oncology | Admitting: Radiation Oncology

## 2022-09-30 ENCOUNTER — Ambulatory Visit: Payer: 59

## 2022-09-30 ENCOUNTER — Other Ambulatory Visit: Payer: Self-pay

## 2022-09-30 DIAGNOSIS — C61 Malignant neoplasm of prostate: Secondary | ICD-10-CM | POA: Diagnosis not present

## 2022-09-30 LAB — RAD ONC ARIA SESSION SUMMARY
Course Elapsed Days: 4
Plan Fractions Treated to Date: 3
Plan Prescribed Dose Per Fraction: 1.8 Gy
Plan Total Fractions Prescribed: 25
Plan Total Prescribed Dose: 45 Gy
Reference Point Dosage Given to Date: 5.4 Gy
Reference Point Session Dosage Given: 1.8 Gy
Session Number: 3

## 2022-10-01 ENCOUNTER — Ambulatory Visit
Admission: RE | Admit: 2022-10-01 | Discharge: 2022-10-01 | Disposition: A | Discharge status: Home / Self Care | Payer: 59 | Source: Ambulatory Visit | Attending: Radiation Oncology | Admitting: Radiation Oncology

## 2022-10-01 ENCOUNTER — Ambulatory Visit: Payer: 59

## 2022-10-01 ENCOUNTER — Other Ambulatory Visit: Payer: Self-pay

## 2022-10-01 DIAGNOSIS — C61 Malignant neoplasm of prostate: Secondary | ICD-10-CM | POA: Diagnosis not present

## 2022-10-01 LAB — RAD ONC ARIA SESSION SUMMARY
Course Elapsed Days: 5
Plan Fractions Treated to Date: 4
Plan Prescribed Dose Per Fraction: 1.8 Gy
Plan Total Fractions Prescribed: 25
Plan Total Prescribed Dose: 45 Gy
Reference Point Dosage Given to Date: 7.2 Gy
Reference Point Session Dosage Given: 1.8 Gy
Session Number: 4

## 2022-10-02 ENCOUNTER — Ambulatory Visit: Payer: 59

## 2022-10-02 ENCOUNTER — Other Ambulatory Visit: Payer: Self-pay

## 2022-10-02 ENCOUNTER — Ambulatory Visit
Admission: RE | Admit: 2022-10-02 | Discharge: 2022-10-02 | Disposition: A | Discharge status: Home / Self Care | Payer: 59 | Source: Ambulatory Visit | Attending: Radiation Oncology | Admitting: Radiation Oncology

## 2022-10-02 DIAGNOSIS — C61 Malignant neoplasm of prostate: Secondary | ICD-10-CM | POA: Diagnosis not present

## 2022-10-02 LAB — RAD ONC ARIA SESSION SUMMARY
Course Elapsed Days: 6
Plan Fractions Treated to Date: 5
Plan Prescribed Dose Per Fraction: 1.8 Gy
Plan Total Fractions Prescribed: 25
Plan Total Prescribed Dose: 45 Gy
Reference Point Dosage Given to Date: 9 Gy
Reference Point Session Dosage Given: 1.8 Gy
Session Number: 5

## 2022-10-03 ENCOUNTER — Other Ambulatory Visit: Payer: Self-pay

## 2022-10-03 ENCOUNTER — Ambulatory Visit
Admission: RE | Admit: 2022-10-03 | Discharge: 2022-10-03 | Disposition: A | Discharge status: Home / Self Care | Payer: 59 | Source: Ambulatory Visit | Attending: Radiation Oncology | Admitting: Radiation Oncology

## 2022-10-03 ENCOUNTER — Ambulatory Visit: Payer: 59

## 2022-10-03 DIAGNOSIS — C61 Malignant neoplasm of prostate: Secondary | ICD-10-CM | POA: Diagnosis not present

## 2022-10-03 LAB — RAD ONC ARIA SESSION SUMMARY
Course Elapsed Days: 7
Plan Fractions Treated to Date: 6
Plan Prescribed Dose Per Fraction: 1.8 Gy
Plan Total Fractions Prescribed: 25
Plan Total Prescribed Dose: 45 Gy
Reference Point Dosage Given to Date: 10.8 Gy
Reference Point Session Dosage Given: 1.8 Gy
Session Number: 6

## 2022-10-04 ENCOUNTER — Ambulatory Visit
Admission: RE | Admit: 2022-10-04 | Discharge: 2022-10-04 | Disposition: A | Discharge status: Home / Self Care | Payer: 59 | Source: Ambulatory Visit | Attending: Radiation Oncology | Admitting: Radiation Oncology

## 2022-10-04 ENCOUNTER — Ambulatory Visit: Payer: 59

## 2022-10-04 ENCOUNTER — Other Ambulatory Visit: Payer: Self-pay

## 2022-10-04 DIAGNOSIS — C61 Malignant neoplasm of prostate: Secondary | ICD-10-CM | POA: Diagnosis not present

## 2022-10-04 LAB — RAD ONC ARIA SESSION SUMMARY
Course Elapsed Days: 8
Plan Fractions Treated to Date: 7
Plan Prescribed Dose Per Fraction: 1.8 Gy
Plan Total Fractions Prescribed: 25
Plan Total Prescribed Dose: 45 Gy
Reference Point Dosage Given to Date: 12.6 Gy
Reference Point Session Dosage Given: 1.8 Gy
Session Number: 7

## 2022-10-07 ENCOUNTER — Ambulatory Visit
Admission: RE | Admit: 2022-10-07 | Discharge: 2022-10-07 | Disposition: A | Discharge status: Home / Self Care | Payer: 59 | Source: Ambulatory Visit | Attending: Radiation Oncology | Admitting: Radiation Oncology

## 2022-10-07 ENCOUNTER — Other Ambulatory Visit: Payer: Self-pay

## 2022-10-07 ENCOUNTER — Ambulatory Visit: Payer: 59

## 2022-10-07 DIAGNOSIS — C61 Malignant neoplasm of prostate: Secondary | ICD-10-CM | POA: Diagnosis not present

## 2022-10-07 LAB — RAD ONC ARIA SESSION SUMMARY
Course Elapsed Days: 11
Plan Fractions Treated to Date: 8
Plan Prescribed Dose Per Fraction: 1.8 Gy
Plan Total Fractions Prescribed: 25
Plan Total Prescribed Dose: 45 Gy
Reference Point Dosage Given to Date: 14.4 Gy
Reference Point Session Dosage Given: 1.8 Gy
Session Number: 8

## 2022-10-08 ENCOUNTER — Ambulatory Visit: Payer: 59

## 2022-10-08 ENCOUNTER — Other Ambulatory Visit: Payer: Self-pay

## 2022-10-08 ENCOUNTER — Ambulatory Visit
Admission: RE | Admit: 2022-10-08 | Discharge: 2022-10-08 | Disposition: A | Discharge status: Home / Self Care | Payer: 59 | Source: Ambulatory Visit | Attending: Radiation Oncology | Admitting: Radiation Oncology

## 2022-10-08 DIAGNOSIS — C61 Malignant neoplasm of prostate: Secondary | ICD-10-CM | POA: Diagnosis not present

## 2022-10-08 LAB — RAD ONC ARIA SESSION SUMMARY
Course Elapsed Days: 12
Plan Fractions Treated to Date: 9
Plan Prescribed Dose Per Fraction: 1.8 Gy
Plan Total Fractions Prescribed: 25
Plan Total Prescribed Dose: 45 Gy
Reference Point Dosage Given to Date: 16.2 Gy
Reference Point Session Dosage Given: 1.8 Gy
Session Number: 9

## 2022-10-09 ENCOUNTER — Other Ambulatory Visit: Payer: Self-pay

## 2022-10-09 ENCOUNTER — Ambulatory Visit
Admission: RE | Admit: 2022-10-09 | Discharge: 2022-10-09 | Disposition: A | Discharge status: Home / Self Care | Payer: 59 | Source: Ambulatory Visit | Attending: Radiation Oncology | Admitting: Radiation Oncology

## 2022-10-09 ENCOUNTER — Ambulatory Visit: Payer: 59

## 2022-10-09 DIAGNOSIS — C61 Malignant neoplasm of prostate: Secondary | ICD-10-CM | POA: Diagnosis not present

## 2022-10-09 LAB — RAD ONC ARIA SESSION SUMMARY
Course Elapsed Days: 13
Plan Fractions Treated to Date: 10
Plan Prescribed Dose Per Fraction: 1.8 Gy
Plan Total Fractions Prescribed: 25
Plan Total Prescribed Dose: 45 Gy
Reference Point Dosage Given to Date: 18 Gy
Reference Point Session Dosage Given: 1.8 Gy
Session Number: 10

## 2022-10-10 ENCOUNTER — Ambulatory Visit
Admission: RE | Admit: 2022-10-10 | Discharge: 2022-10-10 | Disposition: A | Discharge status: Home / Self Care | Payer: 59 | Source: Ambulatory Visit | Attending: Radiation Oncology | Admitting: Radiation Oncology

## 2022-10-10 ENCOUNTER — Ambulatory Visit: Payer: 59

## 2022-10-10 ENCOUNTER — Other Ambulatory Visit: Payer: Self-pay

## 2022-10-10 DIAGNOSIS — C61 Malignant neoplasm of prostate: Secondary | ICD-10-CM | POA: Diagnosis not present

## 2022-10-10 LAB — RAD ONC ARIA SESSION SUMMARY
Course Elapsed Days: 14
Plan Fractions Treated to Date: 11
Plan Prescribed Dose Per Fraction: 1.8 Gy
Plan Total Fractions Prescribed: 25
Plan Total Prescribed Dose: 45 Gy
Reference Point Dosage Given to Date: 19.8 Gy
Reference Point Session Dosage Given: 1.8 Gy
Session Number: 11

## 2022-10-11 ENCOUNTER — Ambulatory Visit
Admission: RE | Admit: 2022-10-11 | Discharge: 2022-10-11 | Disposition: A | Discharge status: Home / Self Care | Payer: 59 | Source: Ambulatory Visit | Attending: Radiation Oncology | Admitting: Radiation Oncology

## 2022-10-11 ENCOUNTER — Ambulatory Visit: Payer: 59

## 2022-10-11 ENCOUNTER — Other Ambulatory Visit: Payer: Self-pay

## 2022-10-11 DIAGNOSIS — C61 Malignant neoplasm of prostate: Secondary | ICD-10-CM | POA: Diagnosis not present

## 2022-10-11 LAB — RAD ONC ARIA SESSION SUMMARY
Course Elapsed Days: 15
Plan Fractions Treated to Date: 12
Plan Prescribed Dose Per Fraction: 1.8 Gy
Plan Total Fractions Prescribed: 25
Plan Total Prescribed Dose: 45 Gy
Reference Point Dosage Given to Date: 21.6 Gy
Reference Point Session Dosage Given: 1.8 Gy
Session Number: 12

## 2022-10-11 NOTE — Progress Notes (Signed)
RN left voicemail for call back to assess any navigation needs since starting radiation treatment.

## 2022-10-14 ENCOUNTER — Ambulatory Visit
Admission: RE | Admit: 2022-10-14 | Discharge: 2022-10-14 | Disposition: A | Discharge status: Home / Self Care | Payer: 59 | Source: Ambulatory Visit | Attending: Radiation Oncology | Admitting: Radiation Oncology

## 2022-10-14 ENCOUNTER — Other Ambulatory Visit: Payer: Self-pay

## 2022-10-14 ENCOUNTER — Ambulatory Visit: Payer: 59

## 2022-10-14 DIAGNOSIS — C61 Malignant neoplasm of prostate: Secondary | ICD-10-CM | POA: Diagnosis not present

## 2022-10-14 LAB — RAD ONC ARIA SESSION SUMMARY
Course Elapsed Days: 18
Plan Fractions Treated to Date: 13
Plan Prescribed Dose Per Fraction: 1.8 Gy
Plan Total Fractions Prescribed: 25
Plan Total Prescribed Dose: 45 Gy
Reference Point Dosage Given to Date: 23.4 Gy
Reference Point Session Dosage Given: 1.8 Gy
Session Number: 13

## 2022-10-15 ENCOUNTER — Other Ambulatory Visit: Payer: Self-pay

## 2022-10-15 ENCOUNTER — Ambulatory Visit
Admission: RE | Admit: 2022-10-15 | Discharge: 2022-10-15 | Disposition: A | Discharge status: Home / Self Care | Payer: 59 | Source: Ambulatory Visit | Attending: Radiation Oncology | Admitting: Radiation Oncology

## 2022-10-15 ENCOUNTER — Ambulatory Visit: Payer: 59

## 2022-10-15 DIAGNOSIS — C61 Malignant neoplasm of prostate: Secondary | ICD-10-CM | POA: Diagnosis not present

## 2022-10-15 LAB — RAD ONC ARIA SESSION SUMMARY
Course Elapsed Days: 19
Plan Fractions Treated to Date: 14
Plan Prescribed Dose Per Fraction: 1.8 Gy
Plan Total Fractions Prescribed: 25
Plan Total Prescribed Dose: 45 Gy
Reference Point Dosage Given to Date: 25.2 Gy
Reference Point Session Dosage Given: 1.8 Gy
Session Number: 14

## 2022-10-16 ENCOUNTER — Other Ambulatory Visit: Payer: Self-pay

## 2022-10-16 ENCOUNTER — Ambulatory Visit: Payer: 59

## 2022-10-16 ENCOUNTER — Ambulatory Visit
Admission: RE | Admit: 2022-10-16 | Discharge: 2022-10-16 | Disposition: A | Discharge status: Home / Self Care | Payer: 59 | Source: Ambulatory Visit | Attending: Radiation Oncology | Admitting: Radiation Oncology

## 2022-10-16 DIAGNOSIS — C61 Malignant neoplasm of prostate: Secondary | ICD-10-CM | POA: Diagnosis not present

## 2022-10-16 LAB — RAD ONC ARIA SESSION SUMMARY
Course Elapsed Days: 20
Plan Fractions Treated to Date: 15
Plan Prescribed Dose Per Fraction: 1.8 Gy
Plan Total Fractions Prescribed: 25
Plan Total Prescribed Dose: 45 Gy
Reference Point Dosage Given to Date: 27 Gy
Reference Point Session Dosage Given: 1.8 Gy
Session Number: 15

## 2022-10-17 ENCOUNTER — Other Ambulatory Visit: Payer: Self-pay

## 2022-10-17 ENCOUNTER — Ambulatory Visit
Admission: RE | Admit: 2022-10-17 | Discharge: 2022-10-17 | Disposition: A | Discharge status: Home / Self Care | Payer: 59 | Source: Ambulatory Visit | Attending: Radiation Oncology | Admitting: Radiation Oncology

## 2022-10-17 ENCOUNTER — Ambulatory Visit: Payer: 59

## 2022-10-17 DIAGNOSIS — C61 Malignant neoplasm of prostate: Secondary | ICD-10-CM | POA: Diagnosis not present

## 2022-10-17 LAB — RAD ONC ARIA SESSION SUMMARY
Course Elapsed Days: 21
Plan Fractions Treated to Date: 16
Plan Prescribed Dose Per Fraction: 1.8 Gy
Plan Total Fractions Prescribed: 25
Plan Total Prescribed Dose: 45 Gy
Reference Point Dosage Given to Date: 28.8 Gy
Reference Point Session Dosage Given: 1.8 Gy
Session Number: 16

## 2022-10-18 ENCOUNTER — Ambulatory Visit
Admission: RE | Admit: 2022-10-18 | Discharge: 2022-10-18 | Disposition: A | Discharge status: Home / Self Care | Payer: 59 | Source: Ambulatory Visit | Attending: Radiation Oncology | Admitting: Radiation Oncology

## 2022-10-18 ENCOUNTER — Other Ambulatory Visit: Payer: Self-pay

## 2022-10-18 ENCOUNTER — Ambulatory Visit: Payer: 59

## 2022-10-18 DIAGNOSIS — C61 Malignant neoplasm of prostate: Secondary | ICD-10-CM | POA: Insufficient documentation

## 2022-10-18 LAB — RAD ONC ARIA SESSION SUMMARY
Course Elapsed Days: 22
Plan Fractions Treated to Date: 17
Plan Prescribed Dose Per Fraction: 1.8 Gy
Plan Total Fractions Prescribed: 25
Plan Total Prescribed Dose: 45 Gy
Reference Point Dosage Given to Date: 30.6 Gy
Reference Point Session Dosage Given: 1.8 Gy
Session Number: 17

## 2022-10-21 ENCOUNTER — Ambulatory Visit
Admission: RE | Admit: 2022-10-21 | Discharge: 2022-10-21 | Disposition: A | Discharge status: Home / Self Care | Payer: 59 | Source: Ambulatory Visit | Attending: Radiation Oncology | Admitting: Radiation Oncology

## 2022-10-21 ENCOUNTER — Other Ambulatory Visit: Payer: Self-pay

## 2022-10-21 ENCOUNTER — Ambulatory Visit: Payer: 59

## 2022-10-21 DIAGNOSIS — C61 Malignant neoplasm of prostate: Secondary | ICD-10-CM | POA: Diagnosis not present

## 2022-10-21 LAB — RAD ONC ARIA SESSION SUMMARY
Course Elapsed Days: 25
Plan Fractions Treated to Date: 18
Plan Prescribed Dose Per Fraction: 1.8 Gy
Plan Total Fractions Prescribed: 25
Plan Total Prescribed Dose: 45 Gy
Reference Point Dosage Given to Date: 32.4 Gy
Reference Point Session Dosage Given: 1.8 Gy
Session Number: 18

## 2022-10-22 ENCOUNTER — Ambulatory Visit
Admission: RE | Admit: 2022-10-22 | Discharge: 2022-10-22 | Disposition: A | Discharge status: Home / Self Care | Payer: 59 | Source: Ambulatory Visit | Attending: Radiation Oncology | Admitting: Radiation Oncology

## 2022-10-22 ENCOUNTER — Other Ambulatory Visit: Payer: Self-pay

## 2022-10-22 ENCOUNTER — Ambulatory Visit: Payer: 59

## 2022-10-22 DIAGNOSIS — C61 Malignant neoplasm of prostate: Secondary | ICD-10-CM | POA: Diagnosis not present

## 2022-10-22 LAB — RAD ONC ARIA SESSION SUMMARY
Course Elapsed Days: 26
Plan Fractions Treated to Date: 19
Plan Prescribed Dose Per Fraction: 1.8 Gy
Plan Total Fractions Prescribed: 25
Plan Total Prescribed Dose: 45 Gy
Reference Point Dosage Given to Date: 34.2 Gy
Reference Point Session Dosage Given: 1.8 Gy
Session Number: 19

## 2022-10-23 ENCOUNTER — Other Ambulatory Visit: Payer: Self-pay

## 2022-10-23 ENCOUNTER — Ambulatory Visit: Payer: 59

## 2022-10-23 ENCOUNTER — Ambulatory Visit
Admission: RE | Admit: 2022-10-23 | Discharge: 2022-10-23 | Disposition: A | Discharge status: Home / Self Care | Payer: 59 | Source: Ambulatory Visit | Attending: Radiation Oncology | Admitting: Radiation Oncology

## 2022-10-23 DIAGNOSIS — C61 Malignant neoplasm of prostate: Secondary | ICD-10-CM | POA: Diagnosis not present

## 2022-10-23 LAB — RAD ONC ARIA SESSION SUMMARY
Course Elapsed Days: 27
Plan Fractions Treated to Date: 20
Plan Prescribed Dose Per Fraction: 1.8 Gy
Plan Total Fractions Prescribed: 25
Plan Total Prescribed Dose: 45 Gy
Reference Point Dosage Given to Date: 36 Gy
Reference Point Session Dosage Given: 1.8 Gy
Session Number: 20

## 2022-10-24 ENCOUNTER — Other Ambulatory Visit: Payer: Self-pay

## 2022-10-24 ENCOUNTER — Ambulatory Visit
Admission: RE | Admit: 2022-10-24 | Discharge: 2022-10-24 | Disposition: A | Discharge status: Home / Self Care | Payer: 59 | Source: Ambulatory Visit | Attending: Radiation Oncology | Admitting: Radiation Oncology

## 2022-10-24 ENCOUNTER — Ambulatory Visit: Payer: 59

## 2022-10-24 DIAGNOSIS — C61 Malignant neoplasm of prostate: Secondary | ICD-10-CM | POA: Diagnosis not present

## 2022-10-24 LAB — RAD ONC ARIA SESSION SUMMARY
Course Elapsed Days: 28
Plan Fractions Treated to Date: 21
Plan Prescribed Dose Per Fraction: 1.8 Gy
Plan Total Fractions Prescribed: 25
Plan Total Prescribed Dose: 45 Gy
Reference Point Dosage Given to Date: 37.8 Gy
Reference Point Session Dosage Given: 1.8 Gy
Session Number: 21

## 2022-10-25 ENCOUNTER — Ambulatory Visit: Payer: 59

## 2022-10-25 ENCOUNTER — Other Ambulatory Visit: Payer: Self-pay

## 2022-10-25 ENCOUNTER — Ambulatory Visit
Admission: RE | Admit: 2022-10-25 | Discharge: 2022-10-25 | Disposition: A | Discharge status: Home / Self Care | Payer: 59 | Source: Ambulatory Visit | Attending: Radiation Oncology | Admitting: Radiation Oncology

## 2022-10-25 DIAGNOSIS — C61 Malignant neoplasm of prostate: Secondary | ICD-10-CM | POA: Diagnosis not present

## 2022-10-25 LAB — RAD ONC ARIA SESSION SUMMARY
Course Elapsed Days: 29
Plan Fractions Treated to Date: 22
Plan Prescribed Dose Per Fraction: 1.8 Gy
Plan Total Fractions Prescribed: 25
Plan Total Prescribed Dose: 45 Gy
Reference Point Dosage Given to Date: 39.6 Gy
Reference Point Session Dosage Given: 1.8 Gy
Session Number: 22

## 2022-10-28 ENCOUNTER — Ambulatory Visit: Payer: 59

## 2022-10-28 ENCOUNTER — Other Ambulatory Visit: Payer: Self-pay

## 2022-10-28 ENCOUNTER — Ambulatory Visit
Admission: RE | Admit: 2022-10-28 | Discharge: 2022-10-28 | Disposition: A | Discharge status: Home / Self Care | Payer: 59 | Source: Ambulatory Visit | Attending: Radiation Oncology | Admitting: Radiation Oncology

## 2022-10-28 DIAGNOSIS — C61 Malignant neoplasm of prostate: Secondary | ICD-10-CM | POA: Diagnosis not present

## 2022-10-28 LAB — RAD ONC ARIA SESSION SUMMARY
Course Elapsed Days: 32
Plan Fractions Treated to Date: 23
Plan Prescribed Dose Per Fraction: 1.8 Gy
Plan Total Fractions Prescribed: 25
Plan Total Prescribed Dose: 45 Gy
Reference Point Dosage Given to Date: 41.4 Gy
Reference Point Session Dosage Given: 1.8 Gy
Session Number: 23

## 2022-10-29 ENCOUNTER — Other Ambulatory Visit: Payer: Self-pay

## 2022-10-29 ENCOUNTER — Ambulatory Visit: Payer: 59

## 2022-10-29 ENCOUNTER — Ambulatory Visit
Admission: RE | Admit: 2022-10-29 | Discharge: 2022-10-29 | Disposition: A | Discharge status: Home / Self Care | Payer: 59 | Source: Ambulatory Visit | Attending: Radiation Oncology | Admitting: Radiation Oncology

## 2022-10-29 DIAGNOSIS — C61 Malignant neoplasm of prostate: Secondary | ICD-10-CM | POA: Diagnosis not present

## 2022-10-29 LAB — RAD ONC ARIA SESSION SUMMARY
Course Elapsed Days: 33
Plan Fractions Treated to Date: 24
Plan Prescribed Dose Per Fraction: 1.8 Gy
Plan Total Fractions Prescribed: 25
Plan Total Prescribed Dose: 45 Gy
Reference Point Dosage Given to Date: 43.2 Gy
Reference Point Session Dosage Given: 1.8 Gy
Session Number: 24

## 2022-10-30 ENCOUNTER — Other Ambulatory Visit: Payer: Self-pay

## 2022-10-30 ENCOUNTER — Ambulatory Visit
Admission: RE | Admit: 2022-10-30 | Discharge: 2022-10-30 | Disposition: A | Discharge status: Home / Self Care | Payer: 59 | Source: Ambulatory Visit | Attending: Radiation Oncology | Admitting: Radiation Oncology

## 2022-10-30 ENCOUNTER — Ambulatory Visit: Payer: 59

## 2022-10-30 DIAGNOSIS — C61 Malignant neoplasm of prostate: Secondary | ICD-10-CM | POA: Diagnosis not present

## 2022-10-30 LAB — RAD ONC ARIA SESSION SUMMARY
Course Elapsed Days: 34
Plan Fractions Treated to Date: 25
Plan Prescribed Dose Per Fraction: 1.8 Gy
Plan Total Fractions Prescribed: 25
Plan Total Prescribed Dose: 45 Gy
Reference Point Dosage Given to Date: 45 Gy
Reference Point Session Dosage Given: 1.8 Gy
Session Number: 25

## 2022-10-31 ENCOUNTER — Ambulatory Visit
Admission: RE | Admit: 2022-10-31 | Discharge: 2022-10-31 | Disposition: A | Discharge status: Home / Self Care | Payer: 59 | Source: Ambulatory Visit | Attending: Radiation Oncology | Admitting: Radiation Oncology

## 2022-10-31 ENCOUNTER — Other Ambulatory Visit: Payer: Self-pay

## 2022-10-31 ENCOUNTER — Ambulatory Visit: Payer: 59

## 2022-10-31 DIAGNOSIS — C61 Malignant neoplasm of prostate: Secondary | ICD-10-CM | POA: Diagnosis not present

## 2022-10-31 LAB — RAD ONC ARIA SESSION SUMMARY
Course Elapsed Days: 35
Plan Fractions Treated to Date: 1
Plan Prescribed Dose Per Fraction: 1.8 Gy
Plan Total Fractions Prescribed: 13
Plan Total Prescribed Dose: 23.4 Gy
Reference Point Dosage Given to Date: 1.8 Gy
Reference Point Session Dosage Given: 1.8 Gy
Session Number: 26

## 2022-11-01 ENCOUNTER — Ambulatory Visit
Admission: RE | Admit: 2022-11-01 | Discharge: 2022-11-01 | Disposition: A | Discharge status: Home / Self Care | Payer: 59 | Source: Ambulatory Visit | Attending: Radiation Oncology | Admitting: Radiation Oncology

## 2022-11-01 ENCOUNTER — Other Ambulatory Visit: Payer: Self-pay

## 2022-11-01 ENCOUNTER — Ambulatory Visit: Payer: 59

## 2022-11-01 DIAGNOSIS — C61 Malignant neoplasm of prostate: Secondary | ICD-10-CM | POA: Diagnosis not present

## 2022-11-01 LAB — RAD ONC ARIA SESSION SUMMARY
Course Elapsed Days: 36
Plan Fractions Treated to Date: 2
Plan Prescribed Dose Per Fraction: 1.8 Gy
Plan Total Fractions Prescribed: 13
Plan Total Prescribed Dose: 23.4 Gy
Reference Point Dosage Given to Date: 3.6 Gy
Reference Point Session Dosage Given: 1.8 Gy
Session Number: 27

## 2022-11-04 ENCOUNTER — Ambulatory Visit
Admission: RE | Admit: 2022-11-04 | Discharge: 2022-11-04 | Disposition: A | Discharge status: Home / Self Care | Payer: 59 | Source: Ambulatory Visit | Attending: Radiation Oncology | Admitting: Radiation Oncology

## 2022-11-04 ENCOUNTER — Other Ambulatory Visit: Payer: Self-pay

## 2022-11-04 ENCOUNTER — Ambulatory Visit: Payer: 59

## 2022-11-04 DIAGNOSIS — C61 Malignant neoplasm of prostate: Secondary | ICD-10-CM | POA: Diagnosis not present

## 2022-11-04 LAB — RAD ONC ARIA SESSION SUMMARY
Course Elapsed Days: 39
Plan Fractions Treated to Date: 3
Plan Prescribed Dose Per Fraction: 1.8 Gy
Plan Total Fractions Prescribed: 13
Plan Total Prescribed Dose: 23.4 Gy
Reference Point Dosage Given to Date: 5.4 Gy
Reference Point Session Dosage Given: 1.8 Gy
Session Number: 28

## 2022-11-05 ENCOUNTER — Ambulatory Visit
Admission: RE | Admit: 2022-11-05 | Discharge: 2022-11-05 | Disposition: A | Discharge status: Home / Self Care | Payer: 59 | Source: Ambulatory Visit | Attending: Radiation Oncology | Admitting: Radiation Oncology

## 2022-11-05 ENCOUNTER — Ambulatory Visit: Payer: 59

## 2022-11-05 ENCOUNTER — Other Ambulatory Visit: Payer: Self-pay

## 2022-11-05 DIAGNOSIS — C61 Malignant neoplasm of prostate: Secondary | ICD-10-CM | POA: Diagnosis not present

## 2022-11-05 LAB — RAD ONC ARIA SESSION SUMMARY
Course Elapsed Days: 40
Plan Fractions Treated to Date: 4
Plan Prescribed Dose Per Fraction: 1.8 Gy
Plan Total Fractions Prescribed: 13
Plan Total Prescribed Dose: 23.4 Gy
Reference Point Dosage Given to Date: 7.2 Gy
Reference Point Session Dosage Given: 1.8 Gy
Session Number: 29

## 2022-11-06 ENCOUNTER — Ambulatory Visit
Admission: RE | Admit: 2022-11-06 | Discharge: 2022-11-06 | Disposition: A | Discharge status: Home / Self Care | Payer: 59 | Source: Ambulatory Visit | Attending: Radiation Oncology | Admitting: Radiation Oncology

## 2022-11-06 ENCOUNTER — Other Ambulatory Visit: Payer: Self-pay

## 2022-11-06 ENCOUNTER — Ambulatory Visit: Payer: 59

## 2022-11-06 DIAGNOSIS — C61 Malignant neoplasm of prostate: Secondary | ICD-10-CM | POA: Diagnosis not present

## 2022-11-06 LAB — RAD ONC ARIA SESSION SUMMARY
Course Elapsed Days: 41
Plan Fractions Treated to Date: 5
Plan Prescribed Dose Per Fraction: 1.8 Gy
Plan Total Fractions Prescribed: 13
Plan Total Prescribed Dose: 23.4 Gy
Reference Point Dosage Given to Date: 9 Gy
Reference Point Session Dosage Given: 1.8 Gy
Session Number: 30

## 2022-11-07 ENCOUNTER — Ambulatory Visit: Payer: 59

## 2022-11-07 ENCOUNTER — Ambulatory Visit
Admission: RE | Admit: 2022-11-07 | Discharge: 2022-11-07 | Disposition: A | Discharge status: Home / Self Care | Payer: 59 | Source: Ambulatory Visit | Attending: Radiation Oncology | Admitting: Radiation Oncology

## 2022-11-07 ENCOUNTER — Other Ambulatory Visit: Payer: Self-pay

## 2022-11-07 DIAGNOSIS — C61 Malignant neoplasm of prostate: Secondary | ICD-10-CM | POA: Diagnosis not present

## 2022-11-07 LAB — RAD ONC ARIA SESSION SUMMARY
Course Elapsed Days: 42
Plan Fractions Treated to Date: 6
Plan Prescribed Dose Per Fraction: 1.8 Gy
Plan Total Fractions Prescribed: 13
Plan Total Prescribed Dose: 23.4 Gy
Reference Point Dosage Given to Date: 10.8 Gy
Reference Point Session Dosage Given: 1.8 Gy
Session Number: 31

## 2022-11-08 ENCOUNTER — Ambulatory Visit
Admission: RE | Admit: 2022-11-08 | Discharge: 2022-11-08 | Disposition: A | Discharge status: Home / Self Care | Payer: 59 | Source: Ambulatory Visit | Attending: Radiation Oncology | Admitting: Radiation Oncology

## 2022-11-08 ENCOUNTER — Ambulatory Visit: Payer: 59

## 2022-11-08 ENCOUNTER — Other Ambulatory Visit: Payer: Self-pay

## 2022-11-08 DIAGNOSIS — C61 Malignant neoplasm of prostate: Secondary | ICD-10-CM | POA: Diagnosis not present

## 2022-11-08 LAB — RAD ONC ARIA SESSION SUMMARY
Course Elapsed Days: 43
Plan Fractions Treated to Date: 7
Plan Prescribed Dose Per Fraction: 1.8 Gy
Plan Total Fractions Prescribed: 13
Plan Total Prescribed Dose: 23.4 Gy
Reference Point Dosage Given to Date: 12.6 Gy
Reference Point Session Dosage Given: 1.8 Gy
Session Number: 32

## 2022-11-11 ENCOUNTER — Ambulatory Visit: Payer: 59

## 2022-11-11 ENCOUNTER — Ambulatory Visit
Admission: RE | Admit: 2022-11-11 | Discharge: 2022-11-11 | Disposition: A | Discharge status: Home / Self Care | Payer: 59 | Source: Ambulatory Visit | Attending: Radiation Oncology | Admitting: Radiation Oncology

## 2022-11-11 ENCOUNTER — Other Ambulatory Visit: Payer: Self-pay

## 2022-11-11 DIAGNOSIS — C61 Malignant neoplasm of prostate: Secondary | ICD-10-CM | POA: Diagnosis not present

## 2022-11-11 LAB — RAD ONC ARIA SESSION SUMMARY
Course Elapsed Days: 46
Plan Fractions Treated to Date: 8
Plan Prescribed Dose Per Fraction: 1.8 Gy
Plan Total Fractions Prescribed: 13
Plan Total Prescribed Dose: 23.4 Gy
Reference Point Dosage Given to Date: 14.4 Gy
Reference Point Session Dosage Given: 1.8 Gy
Session Number: 33

## 2022-11-12 ENCOUNTER — Other Ambulatory Visit: Payer: Self-pay

## 2022-11-12 ENCOUNTER — Ambulatory Visit: Payer: 59

## 2022-11-12 ENCOUNTER — Ambulatory Visit
Admission: RE | Admit: 2022-11-12 | Discharge: 2022-11-12 | Disposition: A | Discharge status: Home / Self Care | Payer: 59 | Source: Ambulatory Visit | Attending: Radiation Oncology | Admitting: Radiation Oncology

## 2022-11-12 DIAGNOSIS — C61 Malignant neoplasm of prostate: Secondary | ICD-10-CM | POA: Diagnosis not present

## 2022-11-12 LAB — RAD ONC ARIA SESSION SUMMARY
Course Elapsed Days: 47
Plan Fractions Treated to Date: 9
Plan Prescribed Dose Per Fraction: 1.8 Gy
Plan Total Fractions Prescribed: 13
Plan Total Prescribed Dose: 23.4 Gy
Reference Point Dosage Given to Date: 16.2 Gy
Reference Point Session Dosage Given: 1.8 Gy
Session Number: 34

## 2022-11-13 ENCOUNTER — Other Ambulatory Visit: Payer: Self-pay

## 2022-11-13 ENCOUNTER — Ambulatory Visit
Admission: RE | Admit: 2022-11-13 | Discharge: 2022-11-13 | Disposition: A | Discharge status: Home / Self Care | Payer: 59 | Source: Ambulatory Visit | Attending: Radiation Oncology | Admitting: Radiation Oncology

## 2022-11-13 ENCOUNTER — Ambulatory Visit: Payer: 59

## 2022-11-13 DIAGNOSIS — C61 Malignant neoplasm of prostate: Secondary | ICD-10-CM | POA: Diagnosis not present

## 2022-11-13 LAB — RAD ONC ARIA SESSION SUMMARY
Course Elapsed Days: 48
Plan Fractions Treated to Date: 10
Plan Prescribed Dose Per Fraction: 1.8 Gy
Plan Total Fractions Prescribed: 13
Plan Total Prescribed Dose: 23.4 Gy
Reference Point Dosage Given to Date: 18 Gy
Reference Point Session Dosage Given: 1.8 Gy
Session Number: 35

## 2022-11-14 ENCOUNTER — Other Ambulatory Visit: Payer: Self-pay

## 2022-11-14 ENCOUNTER — Ambulatory Visit
Admission: RE | Admit: 2022-11-14 | Discharge: 2022-11-14 | Disposition: A | Discharge status: Home / Self Care | Payer: 59 | Source: Ambulatory Visit | Attending: Radiation Oncology | Admitting: Radiation Oncology

## 2022-11-14 ENCOUNTER — Ambulatory Visit: Payer: 59

## 2022-11-14 DIAGNOSIS — C61 Malignant neoplasm of prostate: Secondary | ICD-10-CM | POA: Diagnosis not present

## 2022-11-14 LAB — RAD ONC ARIA SESSION SUMMARY
Course Elapsed Days: 49
Plan Fractions Treated to Date: 11
Plan Prescribed Dose Per Fraction: 1.8 Gy
Plan Total Fractions Prescribed: 13
Plan Total Prescribed Dose: 23.4 Gy
Reference Point Dosage Given to Date: 19.8 Gy
Reference Point Session Dosage Given: 1.8 Gy
Session Number: 36

## 2022-11-15 ENCOUNTER — Other Ambulatory Visit: Payer: Self-pay

## 2022-11-15 ENCOUNTER — Ambulatory Visit
Admission: RE | Admit: 2022-11-15 | Discharge: 2022-11-15 | Disposition: A | Discharge status: Home / Self Care | Payer: 59 | Source: Ambulatory Visit | Attending: Radiation Oncology | Admitting: Radiation Oncology

## 2022-11-15 ENCOUNTER — Ambulatory Visit: Payer: 59

## 2022-11-15 DIAGNOSIS — C61 Malignant neoplasm of prostate: Secondary | ICD-10-CM | POA: Diagnosis not present

## 2022-11-15 LAB — RAD ONC ARIA SESSION SUMMARY
Course Elapsed Days: 50
Plan Fractions Treated to Date: 12
Plan Prescribed Dose Per Fraction: 1.8 Gy
Plan Total Fractions Prescribed: 13
Plan Total Prescribed Dose: 23.4 Gy
Reference Point Dosage Given to Date: 21.6 Gy
Reference Point Session Dosage Given: 1.8 Gy
Session Number: 37

## 2022-11-18 ENCOUNTER — Ambulatory Visit
Admission: RE | Admit: 2022-11-18 | Discharge: 2022-11-18 | Disposition: A | Discharge status: Home / Self Care | Payer: 59 | Source: Ambulatory Visit | Attending: Radiation Oncology | Admitting: Radiation Oncology

## 2022-11-18 ENCOUNTER — Encounter: Payer: Self-pay | Admitting: Urology

## 2022-11-18 ENCOUNTER — Other Ambulatory Visit: Payer: Self-pay

## 2022-11-18 ENCOUNTER — Ambulatory Visit: Payer: 59

## 2022-11-18 DIAGNOSIS — C61 Malignant neoplasm of prostate: Secondary | ICD-10-CM | POA: Diagnosis present

## 2022-11-18 LAB — RAD ONC ARIA SESSION SUMMARY
Course Elapsed Days: 53
Plan Fractions Treated to Date: 13
Plan Prescribed Dose Per Fraction: 1.8 Gy
Plan Total Fractions Prescribed: 13
Plan Total Prescribed Dose: 23.4 Gy
Reference Point Dosage Given to Date: 23.4 Gy
Reference Point Session Dosage Given: 1.8 Gy
Session Number: 38

## 2022-11-19 ENCOUNTER — Ambulatory Visit: Payer: 59

## 2022-11-19 NOTE — Progress Notes (Signed)
Patient was presented at [facility] on 1/23 for his biochemical recurrence of prostate cancer with a current PSA of 0.25 s/p RALP 04/2018 for Stage p(T3b, N0), Gleason 4+5 prostate cancer.  Patient proceed with treatment recommendations of 7.5 week course of daily external beam therapy without ADT and had his final radiation treatment on 4/1.   Patient is scheduled for a post treatment nurse call on 4/30 and has his first post treatment PSA on 7/24 at [facility].    RN spoke with patient and provided education on importance of post treatment PSA monitoring.  Pt verbalized understanding, no additional needs at this time.

## 2022-11-20 ENCOUNTER — Ambulatory Visit: Payer: 59

## 2022-11-21 ENCOUNTER — Encounter: Payer: Self-pay | Admitting: Internal Medicine

## 2022-11-21 ENCOUNTER — Ambulatory Visit: Payer: 59 | Admitting: Internal Medicine

## 2022-11-21 VITALS — BP 126/80 | HR 53 | Temp 98.3°F | Resp 16 | Ht 67.0 in | Wt 187.4 lb

## 2022-11-21 DIAGNOSIS — R739 Hyperglycemia, unspecified: Secondary | ICD-10-CM | POA: Diagnosis not present

## 2022-11-21 DIAGNOSIS — L84 Corns and callosities: Secondary | ICD-10-CM | POA: Diagnosis not present

## 2022-11-21 DIAGNOSIS — M26629 Arthralgia of temporomandibular joint, unspecified side: Secondary | ICD-10-CM | POA: Diagnosis not present

## 2022-11-21 DIAGNOSIS — K219 Gastro-esophageal reflux disease without esophagitis: Secondary | ICD-10-CM

## 2022-11-21 DIAGNOSIS — G5603 Carpal tunnel syndrome, bilateral upper limbs: Secondary | ICD-10-CM

## 2022-11-21 LAB — CBC WITH DIFFERENTIAL/PLATELET
Basophils Absolute: 0 10*3/uL (ref 0.0–0.1)
Basophils Relative: 0.8 % (ref 0.0–3.0)
Eosinophils Absolute: 0.2 10*3/uL (ref 0.0–0.7)
Eosinophils Relative: 4.2 % (ref 0.0–5.0)
HCT: 41.2 % (ref 39.0–52.0)
Hemoglobin: 14.4 g/dL (ref 13.0–17.0)
Lymphocytes Relative: 8.9 % — ABNORMAL LOW (ref 12.0–46.0)
Lymphs Abs: 0.3 10*3/uL — ABNORMAL LOW (ref 0.7–4.0)
MCHC: 35 g/dL (ref 30.0–36.0)
MCV: 90.5 fl (ref 78.0–100.0)
Monocytes Absolute: 0.7 10*3/uL (ref 0.1–1.0)
Monocytes Relative: 19.9 % — ABNORMAL HIGH (ref 3.0–12.0)
Neutro Abs: 2.4 10*3/uL (ref 1.4–7.7)
Neutrophils Relative %: 66.2 % (ref 43.0–77.0)
Platelets: 240 10*3/uL (ref 150.0–400.0)
RBC: 4.55 Mil/uL (ref 4.22–5.81)
RDW: 14.2 % (ref 11.5–15.5)
WBC: 3.6 10*3/uL — ABNORMAL LOW (ref 4.0–10.5)

## 2022-11-21 LAB — BASIC METABOLIC PANEL
BUN: 12 mg/dL (ref 6–23)
CO2: 30 mEq/L (ref 19–32)
Calcium: 9.2 mg/dL (ref 8.4–10.5)
Chloride: 105 mEq/L (ref 96–112)
Creatinine, Ser: 1.02 mg/dL (ref 0.40–1.50)
GFR: 80.58 mL/min (ref >60.00)
Glucose, Bld: 110 mg/dL — ABNORMAL HIGH (ref 70–99)
Potassium: 4.8 mEq/L (ref 3.5–5.1)
Sodium: 140 mEq/L (ref 135–145)

## 2022-11-21 LAB — HEMOGLOBIN A1C: Hgb A1c MFr Bld: 6.2 % (ref 4.6–6.5)

## 2022-11-21 MED ORDER — PANTOPRAZOLE SODIUM 40 MG PO TBEC: 40.0000 mg | DELAYED_RELEASE_TABLET | Freq: Two times a day (BID) | ORAL | 2 refills | Status: DC

## 2022-11-21 NOTE — Patient Instructions (Addendum)
The pain around the left ear is called "TMJ".  See information below Please reach out to the dentist Avoid heavy chewing. Use ibuprofen, Tylenol and a warm compress. Call if not gradually better  Heartburn: Start pantoprazole 40 mg 1 tablet twice daily on empty stomach for the first 10 days, then 1 tablet in the morning on empty stomach.  If your symptoms are not better let me know    GO TO THE LAB : Get the blood work     [place], [place] Come back for   physical exam in 3 months     Temporomandibular Joint Syndrome  Temporomandibular joint syndrome (TMJ syndrome) is a condition that causes pain in the temporomandibular joints. These joints are located near your ears and allow your jaw to open and close. For people with TMJ syndrome, chewing, biting, or other movements of the jaw can be difficult or painful. TMJ syndrome is often mild and goes away within a few weeks. However, sometimes the condition becomes a long-term (chronic) problem. What are the causes? This condition may be caused by: Grinding your teeth or clenching your jaw. Some people do this when they are stressed. Arthritis. An injury to the jaw. A head or neck injury. Teeth or dentures that are not aligned well. In some cases, the cause of TMJ syndrome may not be known. What are the signs or symptoms? The most common symptom of this condition is aching pain on the side of the head in the area of the TMJ. Other symptoms may include: Pain when moving your jaw, such as when chewing or biting. Not being able to open your jaw all the way. Making a clicking sound when you open your mouth. Headache. Earache. Neck or shoulder pain. How is this diagnosed? This condition may be diagnosed based on: Your symptoms and medical history. A physical exam. Your health care provider may check the range of motion of your jaw. Imaging tests, such as X-rays or an MRI. You may also need to  see your dentist, who will check if your teeth and jaw are lined up correctly. How is this treated? TMJ syndrome often goes away on its own. If treatment is needed, it may include: Eating soft foods and applying ice or heat. Medicines to relieve pain or inflammation. Medicines or massage to relax the muscles. A splint, bite plate, or mouthpiece to prevent teeth grinding or jaw clenching. Relaxation techniques or counseling to help reduce stress. A therapy for pain in which an electrical current is applied to the nerves through the skin (transcutaneous electrical nerve stimulation). Acupuncture. This may help to relieve pain. Jaw surgery. This is rarely needed. Follow these instructions at home:  Eating and drinking Eat a soft diet if you are having trouble chewing. Avoid foods that require a lot of chewing. Do not chew gum. General instructions Take over-the-counter and prescription medicines only as told by your health care provider. If directed, put ice on the painful area. To do this: Put ice in a plastic bag. Place a towel between your skin and the bag. Leave the ice on for 20 minutes, 2-3 times a day. Remove the ice if your skin turns bright red. This is very important. If you cannot feel pain, heat, or cold, you have a greater risk of damage to the area. Apply a warm, wet cloth (warm compress) to the painful area as told. Massage your jaw area and do any jaw stretching exercises  as told by your health care provider. If you were given a splint, bite plate, or mouthpiece, wear it as told by your health care provider. Keep all follow-up visits. This is important. Where to find more information [place]: [email or web address] Contact a health care provider if: You have trouble eating. You have new or worsening symptoms. Get help right away if: Your jaw locks. Summary Temporomandibular joint syndrome (TMJ syndrome) is a condition that  causes pain in the temporomandibular joints. These joints are located near your ears and allow your jaw to open and close. TMJ syndrome is often mild and goes away within a few weeks. However, sometimes the condition becomes a long-term (chronic) problem. Symptoms include an aching pain on the side of the head in the area of the TMJ, pain when chewing or biting, and being unable to open your jaw all the way. You may also make a clicking sound when you open your mouth. TMJ syndrome often goes away on its own. If treatment is needed, it may include medicines to relieve pain, reduce inflammation, or relax the muscles. A splint, bite plate, or mouthpiece may also be used to prevent teeth grinding or jaw clenching. This information is not intended to replace advice given to you by your health care provider. Make sure you discuss any questions you have with your health care provider. Document Revised: 03/18/2021 Document Reviewed: 03/18/2021 Elsevier Patient Education  [place].

## 2022-11-21 NOTE — Progress Notes (Signed)
Subjective:    Patient ID: Danny [name], male    DOB: Dec 22, 1960, 62 y.o.   MRN: [id number]  DOS:  11/21/2022 Type of visit - description: Here with multiple concerns  3 weeks ago developed pain around the left ear: No recent URI symptoms, no fever or chills, no ear discharge or bleeding. Did have dental work few weeks ago. Went to urgent care last week, prescribed prednisone and Flexeril. Pain is not much better, worse with chewing  Having pain at the side of the left foot, no swelling, pain is worse with palpation and  ambulation.  Also, gradual onset of heartburn 2 weeks ago.  Started mild and now is severe.  The way he is describing heartburn is classic, like acid going from the stomach to the throat. No odynophagia, no dysphagia.  No blood in the stools. He did notice some weight loss during radiation therapy for prostate cancer but that is gradually improving.    Review of Systems See above   Past Medical History:  Diagnosis Date   Abnormal electrocardiogram    Abnormal glucose    Anxiety    Cigarette smoker    former smoker- none now    Erectile dysfunction    GERD (gastroesophageal reflux disease)    Paresthesia    Prostate cancer     Past Surgical History:  Procedure Laterality Date   LYMPHADENECTOMY Bilateral 04/27/2018   Procedure: LYMPHADENECTOMY, BILATERAL PELVIC;  Surgeon: Raynelle Bring, MD;  Location: [facility];  Service: Urology;  Laterality: Bilateral;   PROSTATE BIOPSY     ROBOT ASSISTED LAPAROSCOPIC RADICAL PROSTATECTOMY N/A 04/27/2018   Procedure: XI ROBOTIC ASSISTED LAPAROSCOPIC RADICAL PROSTATECTOMY LEVEL 2;  Surgeon: Raynelle Bring, MD;  Location: [facility];  Service: Urology;  Laterality: N/A;    Current Outpatient Medications  Medication Instructions   cyclobenzaprine (FLEXERIL) 10 mg, Oral, 3 times daily   gabapentin (NEURONTIN) 100 mg, Oral, Daily at bedtime, 1 tablet for 3 nights, then 2 tablets for 3 nights, then 3 tablets every night    Multiple Vitamin (MULTIVITAMIN WITH MINERALS) TABS tablet 1 tablet, Oral, Daily   naproxen sodium (ALEVE) 220 mg, Oral, 2 times daily PRN   predniSONE (DELTASONE) 40 mg, Oral, Every morning   sildenafil (VIAGRA) 100 mg, Oral, Every other day   Vitamin D (Ergocalciferol) (DRISDOL) 50,000 Units, Oral, Every 7 days   Vitamin D 2,000 Units, Oral, Daily       Objective:   Physical Exam Musculoskeletal:       Feet:    BP 126/80   Pulse (!) 53   Temp 98.3 F (36.8 C) (Oral)   Resp 16   Ht 5\' 7"  (1.702 m)   Wt 187 lb 6 oz (85 kg)   SpO2 97%   BMI 29.35 kg/m  General:   Well developed, NAD, BMI noted. HEENT:  Normocephalic . Face symmetric, atraumatic TMs: Normal Throat: Symmetric. Has partial dentures TMJ: No click, some pain on the left. Lungs:  CTA B Normal respiratory effort, no intercostal retractions, no accessory muscle use. Heart: RRR,  no murmur.  Lower extremities: no pretibial edema bilaterally  Skin: Not pale. Not jaundice Neurologic:  alert & oriented X3.  Speech normal, gait appropriate for age and unassisted Psych--  Cognition and judgment appear intact.  Cooperative with normal attention span and concentration.  Behavior appropriate. No anxious or depressed appearing.      Assessment     ASSESSMENT Prediabetes MSK: Cervical spondylosis Prostate cancer: -Prostatectomy 04-2018 -  Biochemical recurrence, XRT February March 2024 Thyroid nodules per ultrasound 04-2015: "Follow-up by clinical exam is recommended. If patient has known risk factors for thyroid carcinoma, consider follow-up ultrasound in 12 months. If patient is clinically hyperthyroid, consider nuclear medicine thyroid uptake and scan " ED ++FH lun cancer  H/o Smoker  H/o early repol on EKG H/o anxiety   PLAN: TMJ: Left ear pain consistent with TMJ syndrome, I explained the patient what is this condition, how to treat it.  Recommend to see the dentist.  Call if not better.  See  AVS Heartburn: Sudden onset 2 weeks ago, no red flags, we agreed on pantoprazole twice daily then once daily.  Will check CBC.  Further evaluation if not improving as expected. Left foot pain: Likely related to a corn  Referred to podiatry. Paresthesias, CTS: Chart reviewed: NCS: 09/20/2022, B CTS, R>L Was recommended NSAIDs, gabapentin. Gaba worked but self d/c , rec to restart  Prediabetes: Check A1c Preventive care: Due for C-scope, just finish XRT at the pelvis, we agreed to wait few more months.  Will address during CPX RTC 3 months CPX

## 2022-11-21 NOTE — Assessment & Plan Note (Signed)
TMJ: Left ear pain consistent with TMJ syndrome, I explained the patient what is this condition, how to treat it.  Recommend to see the dentist.  Call if not better.  See AVS Heartburn: Sudden onset 2 weeks ago, no red flags, we agreed on pantoprazole twice daily then once daily.  Will check CBC.  Further evaluation if not improving as expected. Left foot pain: Likely related to a corn  Referred to podiatry. Paresthesias, CTS: Chart reviewed: NCS: 09/20/2022, B CTS, R>L Was recommended NSAIDs, gabapentin. Gaba worked but self d/c , rec to restart  Prediabetes: Check A1c Preventive care: Due for C-scope, just finish XRT at the pelvis, we agreed to wait few more months.  Will address during CPX RTC 3 months CPX

## 2022-12-05 ENCOUNTER — Ambulatory Visit: Payer: 59 | Admitting: Podiatry

## 2022-12-05 ENCOUNTER — Ambulatory Visit (INDEPENDENT_AMBULATORY_CARE_PROVIDER_SITE_OTHER): Payer: 59

## 2022-12-05 DIAGNOSIS — D2372 Other benign neoplasm of skin of left lower limb, including hip: Secondary | ICD-10-CM | POA: Diagnosis not present

## 2022-12-05 DIAGNOSIS — L84 Corns and callosities: Secondary | ICD-10-CM

## 2022-12-05 DIAGNOSIS — M778 Other enthesopathies, not elsewhere classified: Secondary | ICD-10-CM | POA: Diagnosis not present

## 2022-12-05 DIAGNOSIS — M7752 Other enthesopathy of left foot: Secondary | ICD-10-CM

## 2022-12-05 MED ORDER — DEXAMETHASONE SODIUM PHOSPHATE 120 MG/30ML IJ SOLN
2.0000 mg | Freq: Once | INTRAMUSCULAR | Status: AC
Start: 2022-12-05 — End: 2022-12-05
  Administered 2022-12-05: 2 mg via INTRA_ARTICULAR

## 2022-12-05 NOTE — Progress Notes (Signed)
Subjective:  Patient ID: Danny [name], male    DOB: [birth year],  MRN: [id number] HPI Chief Complaint  Patient presents with   Callouses    Patient came in today for left foot callus on the lateral side of the foot, started 3 months ago, rate of pain 10 out of 10 while wearing shoes, X-Rays done today     62 y.o. male presents with the above complaint.   ROS: Denies fever chills nausea vomit muscle aches pains calf pain back pain chest pain shortness of breath.  Past Medical History:  Diagnosis Date   Abnormal electrocardiogram    Abnormal glucose    Anxiety    Cigarette smoker    former smoker- none now    Erectile dysfunction    GERD (gastroesophageal reflux disease)    Paresthesia    Prostate cancer    Past Surgical History:  Procedure Laterality Date   LYMPHADENECTOMY Bilateral 04/27/2018   Procedure: LYMPHADENECTOMY, BILATERAL PELVIC;  Surgeon: Heloise Purpura, MD;  Location: [facility];  Service: Urology;  Laterality: Bilateral;   PROSTATE BIOPSY     ROBOT ASSISTED LAPAROSCOPIC RADICAL PROSTATECTOMY N/A 04/27/2018   Procedure: XI ROBOTIC ASSISTED LAPAROSCOPIC RADICAL PROSTATECTOMY LEVEL 2;  Surgeon: Heloise Purpura, MD;  Location: [facility];  Service: Urology;  Laterality: N/A;    Current Outpatient Medications:    Cholecalciferol (VITAMIN D) 50 MCG (2000 UT) CAPS, Take 2,000 Units by mouth daily., Disp: , Rfl:    cyclobenzaprine (FLEXERIL) 10 MG tablet, Take 10 mg by mouth 3 (three) times daily., Disp: , Rfl:    gabapentin (NEURONTIN) 100 MG capsule, Take 1 capsule (100 mg total) by mouth at bedtime. 1 tablet for 3 nights, then 2 tablets for 3 nights, then 3 tablets every night (Patient not taking: Reported on 11/21/2022), Disp: 90 capsule, Rfl: 0   Multiple Vitamin (MULTIVITAMIN WITH MINERALS) TABS tablet, Take 1 tablet by mouth daily., Disp: , Rfl:    naproxen sodium (ALEVE) 220 MG tablet, Take 1 tablet (220 mg total) by mouth 2 (two) times daily as needed., Disp: 60 tablet,  Rfl: 4   pantoprazole (PROTONIX) 40 MG tablet, Take 1 tablet (40 mg total) by mouth 2 (two) times daily before a meal., Disp: 60 tablet, Rfl: 2   predniSONE (DELTASONE) 20 MG tablet, Take 40 mg by mouth every morning., Disp: , Rfl:    sildenafil (VIAGRA) 100 MG tablet, Take 100 mg by mouth every other day., Disp: , Rfl:    Vitamin D, Ergocalciferol, (DRISDOL) 1.25 MG (50000 UNIT) CAPS capsule, Take 1 capsule (50,000 Units total) by mouth every 7 (seven) days. (Patient not taking: Reported on 11/21/2022), Disp: 12 capsule, Rfl: 0  No Known Allergies Review of Systems Objective:  There were no vitals filed for this visit.  General: Well developed, nourished, in no acute distress, alert and oriented x3   Dermatological: Skin is warm, dry and supple bilateral. Nails x 10 are well maintained; remaining integument appears unremarkable at this time. There are no open sores, no preulcerative lesions, no rash or signs of infection present.  He has a painful porokeratotic lesion to the plantar lateral aspect of the fifth metatarsal base of the left foot with obvious underlying bursitis.  The area is mildly erythematous and tender on medial lateral compression and fluctuant on palpation.  Vascular: Dorsalis Pedis artery and Posterior Tibial artery pedal pulses are 2/4 bilateral with immedate capillary fill time. Pedal hair growth present. No varicosities and no lower extremity edema present  bilateral.   Neruologic: Grossly intact via light touch bilateral. Vibratory intact via tuning fork bilateral. Protective threshold with Semmes Wienstein monofilament intact to all pedal sites bilateral. Patellar and Achilles deep tendon reflexes 2+ bilateral. No Babinski or clonus noted bilateral.   Musculoskeletal: No gross boney pedal deformities bilateral. No pain, crepitus, or limitation noted with foot and ankle range of motion bilateral. Muscular strength 5/5 in all groups tested bilateral.  Gait: Unassisted,  Nonantalgic.    Radiographs:  Radiographs taken today demonstrate osseously mature individual no significant osseous abnormalities particularly over the area in question around the fifth metatarsal base.  Assessment & Plan:   Assessment: Bursitis subfifth metatarsal base left foot.  Porokeratosis/benign skin lesion left foot  Plan: Injected the area today with 2 mg of dexamethasone local anesthetic beneath the lesion into the bursa.  He tolerated the procedure well without complications also debrided the benign skin lesion placed Band-Aid.  I am going to follow-up with him on an as-needed basis.  We discussed appropriate shoe gear.     Fields Oros T. [place], [place]

## 2022-12-06 ENCOUNTER — Other Ambulatory Visit: Payer: Self-pay | Admitting: Urology

## 2022-12-06 DIAGNOSIS — C61 Malignant neoplasm of prostate: Secondary | ICD-10-CM

## 2022-12-06 NOTE — Progress Notes (Signed)
  Radiation Oncology         [phone] ________________________________  Name: Danny [name] MRN: [id number]  Date: 11/18/2022  DOB: 1960/12/20  End of Treatment Note  Diagnosis:   62 y.o. gentleman with a biochemical recurrence of prostate cancer with a current PSA of 0.25 s/p RALP 04/2018 for Stage p(T3b, N0), Gleason 4+5 prostate cancer.      Indication for treatment:  Curative, Definitive Radiotherapy       Radiation treatment dates:   09/26/22 - 11/18/22  Site/dose:  1. The prostate fossa and pelvic lymph nodes were initially treated to 45 Gy in 25 fractions of 1.8 Gy  2. The prostate fossa only was boosted to 68.4 Gy with 13 additional fractions of 1.8 Gy   Beams/energy:  1. The prostate fossa  and pelvic lymph nodes were initially treated using VMAT intensity modulated radiotherapy delivering 6 megavolt photons. Image guidance was performed with CB-CT studies prior to each fraction. He was immobilized with a body fix lower extremity mold.  2. The prostate fossa only was boosted using VMAT intensity modulated radiotherapy delivering 6 megavolt photons. Image guidance was performed with CB-CT studies prior to each fraction. He was immobilized with a body fix lower extremity mold.  Narrative: The patient tolerated radiation treatment relatively well.   The patient experienced some minor urinary irritation and modest fatigue.  He did notice some mild itchy/dry skin in the treatment field.  Plan: The patient will receive a call in about one month from the radiation oncology department. He will continue follow up with his urologist, Dr. [name], as well.  He is scheduled for posttreatment labs on 03/12/2023 and a follow-up visit with Dr. [name] the following week.  ________________________________  Artist Pais. Kathrynn Running, M.D.

## 2022-12-17 ENCOUNTER — Ambulatory Visit
Admission: RE | Admit: 2022-12-17 | Discharge: 2022-12-17 | Disposition: A | Discharge status: Home / Self Care | Payer: 59 | Source: Ambulatory Visit | Attending: Nurse Practitioner | Admitting: Nurse Practitioner

## 2022-12-17 NOTE — Progress Notes (Signed)
  Radiation Oncology         [phone] ________________________________  Name: Danny [name] MRN: [id number]  Date of Service: 12/17/2022  DOB: 09-Nov-1960  Post Treatment Telephone Note  Diagnosis:  Curative, Definitive Radiotherapy        Radiation treatment dates:   09/26/22 - 11/18/22   Site/dose:  1. The prostate fossa and pelvic lymph nodes were initially treated to 45 Gy in 25 fractions of 1.8 Gy  2. The prostate fossa only was boosted to 68.4 Gy with 13 additional fractions of 1.8 Gy   (as documented in provider EOT note)   Pre Treatment IPSS Score: 16 (as documented in the provider consult note)   The patient was not available for call today.   Patient has a scheduled follow up visit with his urologist, Dr. [name], on 02/2023 for ongoing surveillance. He was counseled that PSA levels will be drawn in the urology office, and was reassured that additional time is expected to improve bowel and bladder symptoms. He was encouraged to call back with concerns or questions regarding radiation.   Ruel Favors, LPN

## 2022-12-18 IMAGING — DX DG CHEST 2V
2 series · 2 of 2 positions shown · non-contrast
Comparison: 11/05/2016

CLINICAL DATA: Atypical chest pain for 2 weeks

EXAM:
CHEST - 2 VIEW

[chest pa]
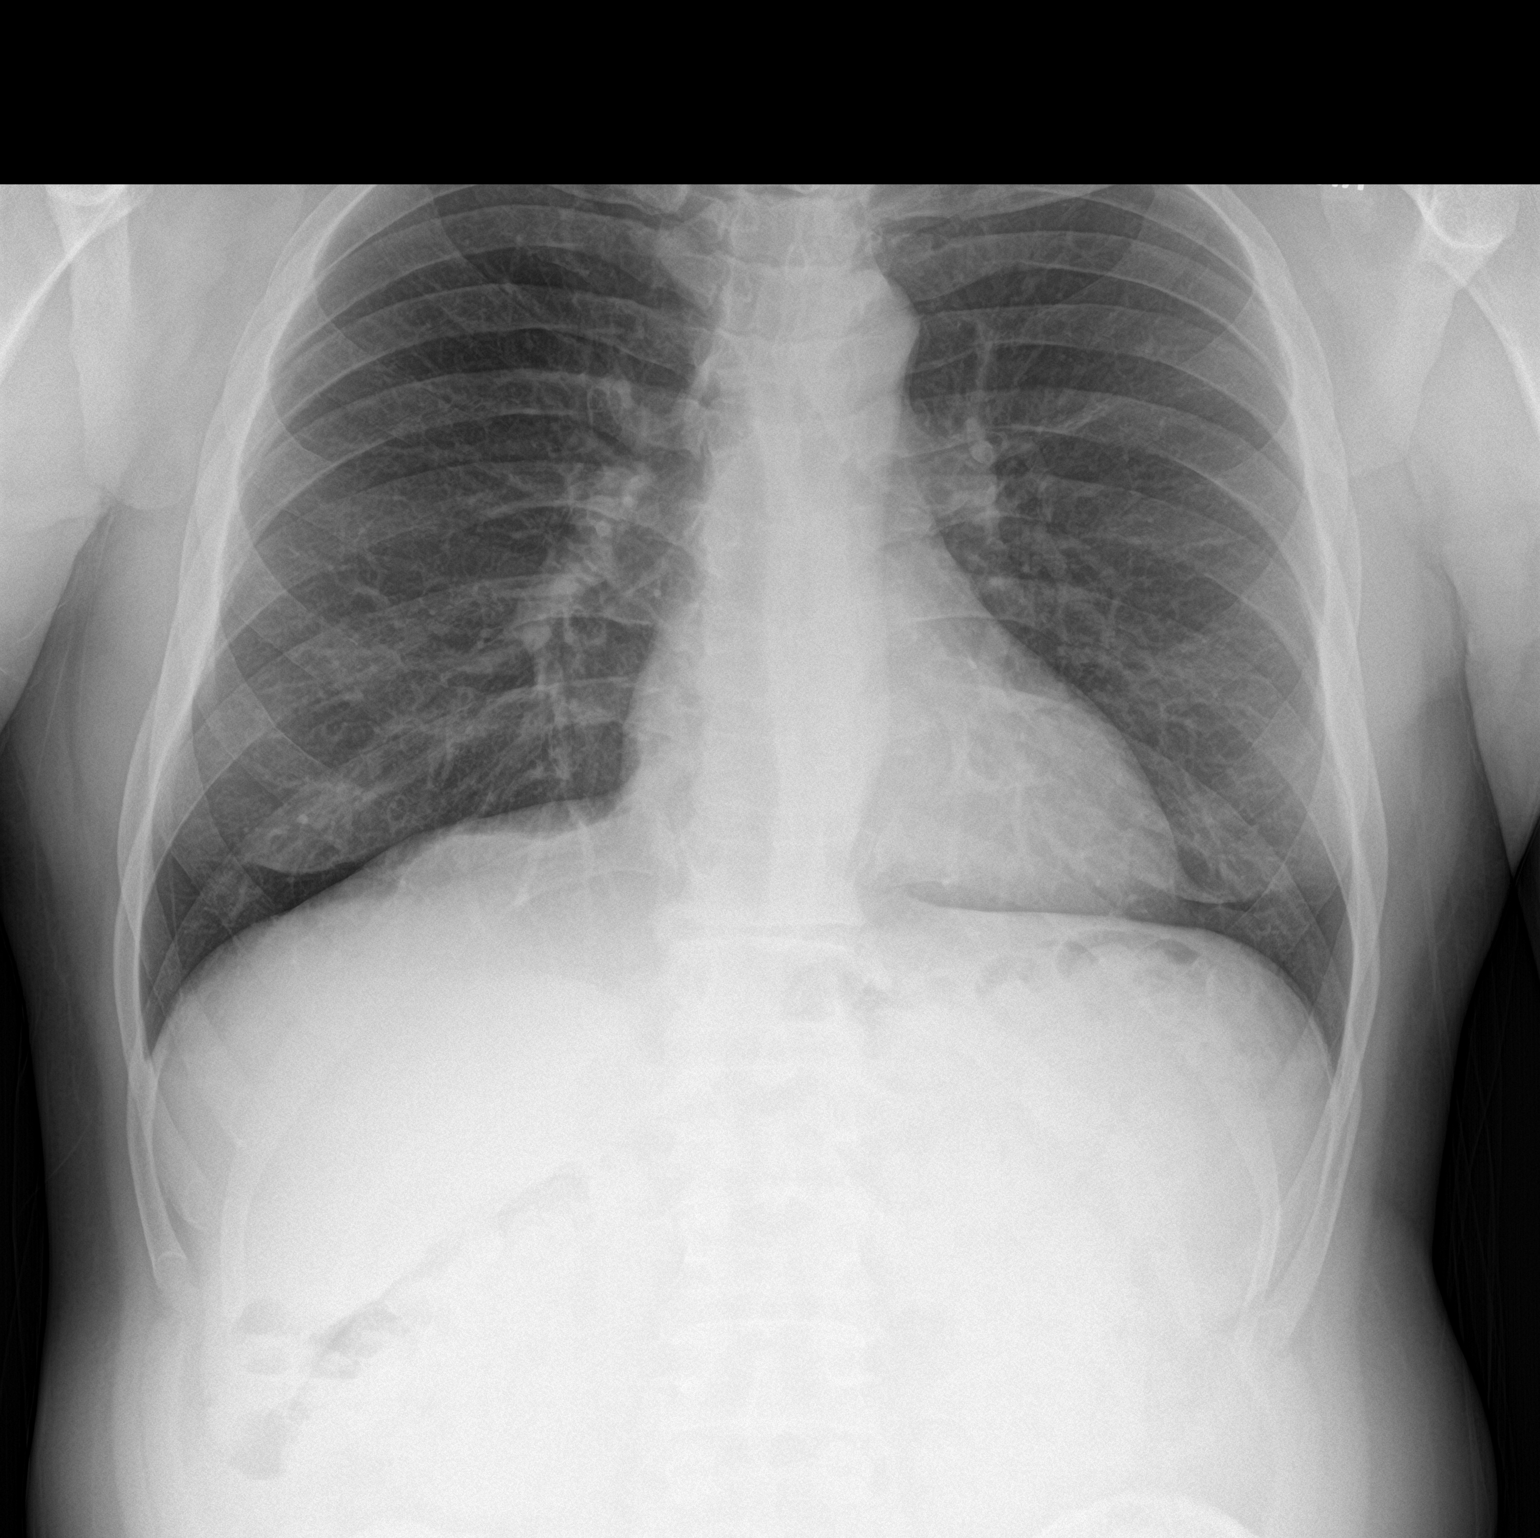

[chest lat]
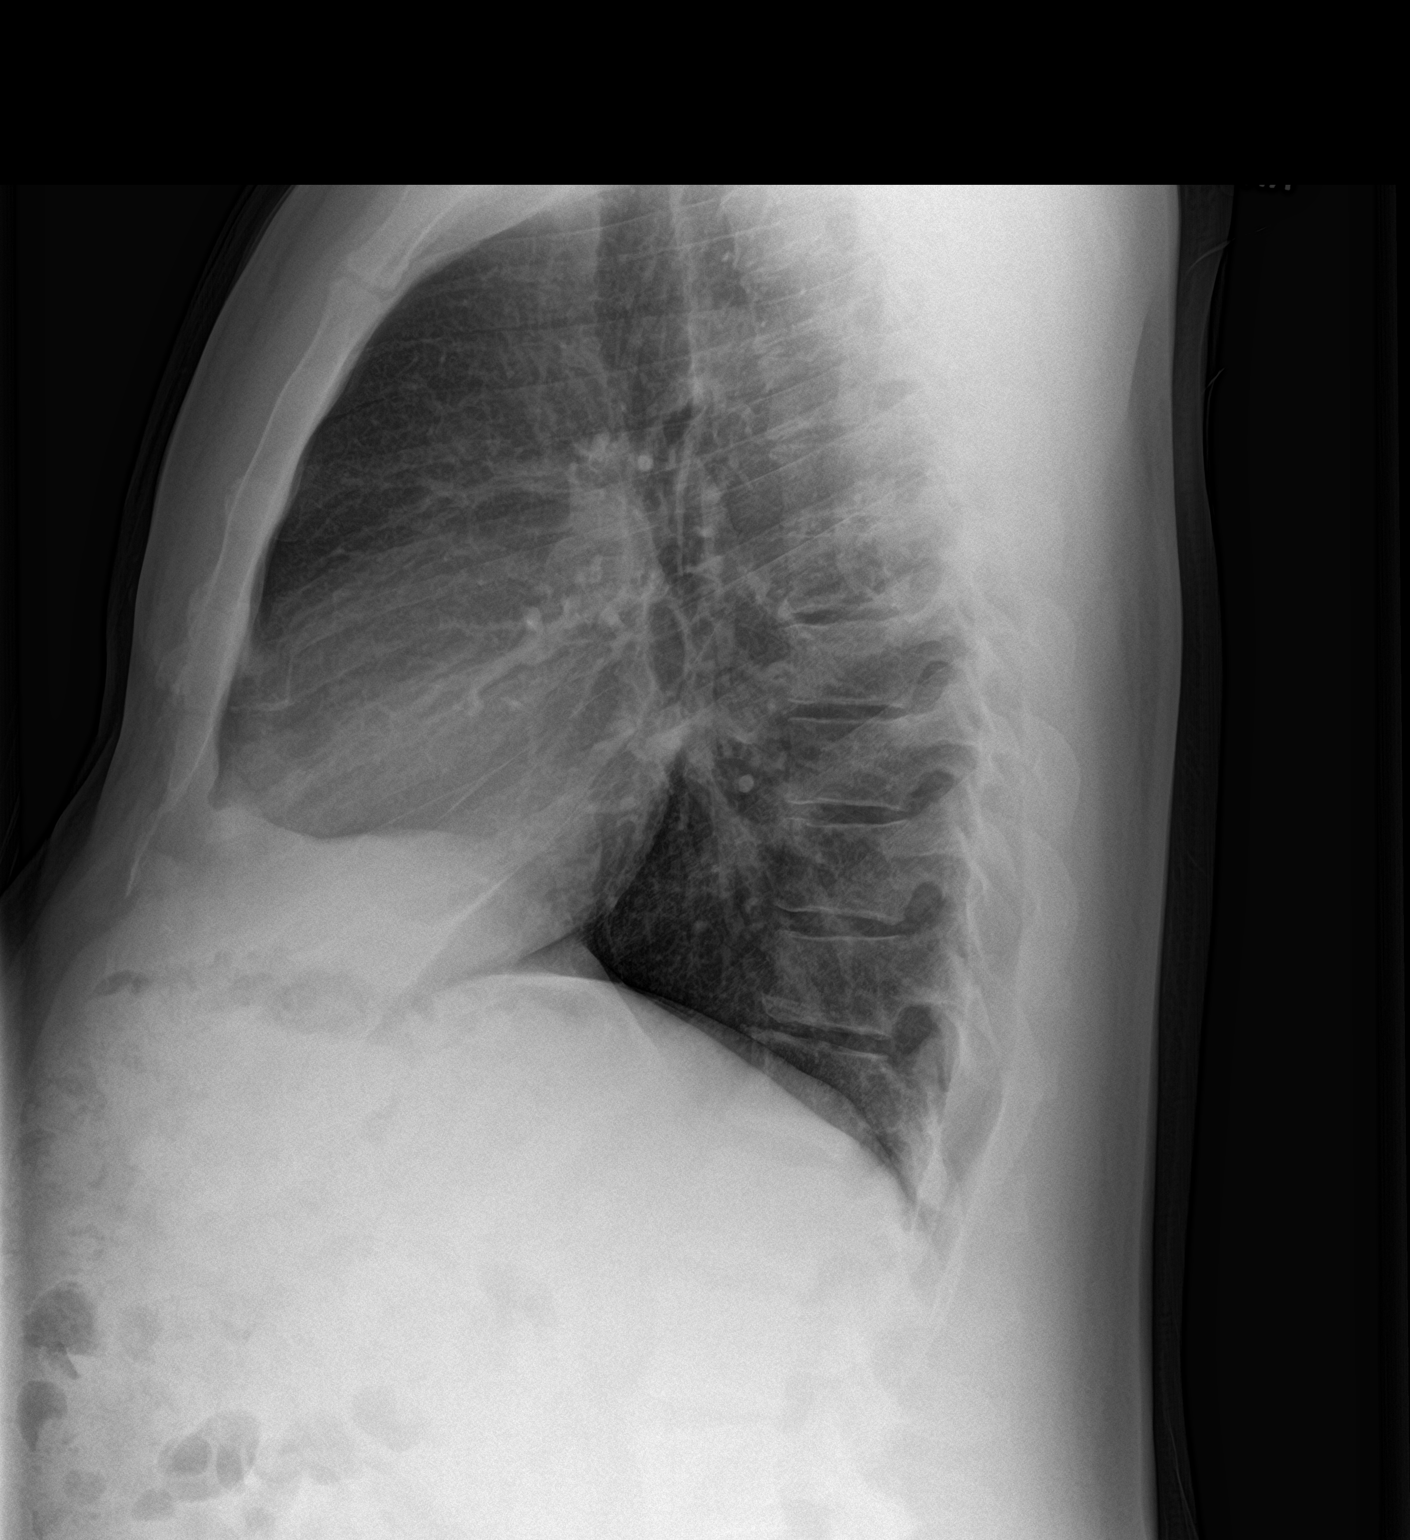

[2 of 2 positions shown; findings below may reference images not displayed]

FINDINGS: The heart size and mediastinal contours are within normal limits.
Both lungs are clear. The visualized skeletal structures are
unremarkable.
IMPRESSION: No active cardiopulmonary disease.

## 2022-12-25 ENCOUNTER — Telehealth: Payer: Self-pay | Admitting: *Deleted

## 2022-12-26 ENCOUNTER — Inpatient Hospital Stay: Payer: 59 | Admitting: *Deleted

## 2023-01-20 ENCOUNTER — Telehealth: Payer: Self-pay | Admitting: Internal Medicine

## 2023-01-20 MED ORDER — GABAPENTIN 100 MG PO CAPS: 300.0000 mg | ORAL_CAPSULE | Freq: Every day | ORAL | 0 refills | Status: DC

## 2023-01-20 NOTE — Telephone Encounter (Signed)
Prescription Request  01/20/2023  Is this a "Controlled Substance" medicine? No  LOV: 11/21/2022  What is the name of the medication or equipment? Gabapentin  Have you contacted your pharmacy to request a refill? Yes   Which pharmacy would you like this sent to?  [id number] - [place], [place] - [address] [place] [address] Phone: [phone] Fax: [phone]    Patient notified that their request is being sent to the clinical staff for review and that they should receive a response within 2 business days.   Please advise at Mobile [phone] (mobile)

## 2023-01-20 NOTE — Telephone Encounter (Signed)
Pt notified rx has been sent in.

## 2023-01-30 ENCOUNTER — Encounter: Payer: Self-pay | Admitting: *Deleted

## 2023-01-30 ENCOUNTER — Inpatient Hospital Stay: Payer: 59 | Attending: Adult Health | Admitting: *Deleted

## 2023-03-12 LAB — PSA: PSA: 0.3

## 2023-03-19 ENCOUNTER — Encounter (INDEPENDENT_AMBULATORY_CARE_PROVIDER_SITE_OTHER): Payer: Self-pay

## 2023-03-20 ENCOUNTER — Encounter: Payer: Self-pay | Admitting: Internal Medicine

## 2023-04-01 ENCOUNTER — Ambulatory Visit (INDEPENDENT_AMBULATORY_CARE_PROVIDER_SITE_OTHER): Payer: 59 | Admitting: Internal Medicine

## 2023-04-01 ENCOUNTER — Encounter: Payer: Self-pay | Admitting: Internal Medicine

## 2023-04-01 VITALS — BP 122/70 | HR 60 | Temp 98.6°F | Resp 18 | Ht 67.0 in | Wt 188.8 lb

## 2023-04-01 DIAGNOSIS — K219 Gastro-esophageal reflux disease without esophagitis: Secondary | ICD-10-CM | POA: Diagnosis not present

## 2023-04-01 DIAGNOSIS — Z23 Encounter for immunization: Secondary | ICD-10-CM

## 2023-04-01 DIAGNOSIS — Z801 Family history of malignant neoplasm of trachea, bronchus and lung: Secondary | ICD-10-CM | POA: Diagnosis not present

## 2023-04-01 DIAGNOSIS — Z0000 Encounter for general adult medical examination without abnormal findings: Secondary | ICD-10-CM | POA: Diagnosis not present

## 2023-04-01 DIAGNOSIS — Z1211 Encounter for screening for malignant neoplasm of colon: Secondary | ICD-10-CM

## 2023-04-01 DIAGNOSIS — R202 Paresthesia of skin: Secondary | ICD-10-CM

## 2023-04-01 DIAGNOSIS — R030 Elevated blood-pressure reading, without diagnosis of hypertension: Secondary | ICD-10-CM

## 2023-04-01 DIAGNOSIS — E559 Vitamin D deficiency, unspecified: Secondary | ICD-10-CM | POA: Diagnosis not present

## 2023-04-01 DIAGNOSIS — R739 Hyperglycemia, unspecified: Secondary | ICD-10-CM | POA: Diagnosis not present

## 2023-04-01 DIAGNOSIS — Z122 Encounter for screening for malignant neoplasm of respiratory organs: Secondary | ICD-10-CM

## 2023-04-01 DIAGNOSIS — Z0001 Encounter for general adult medical examination with abnormal findings: Secondary | ICD-10-CM

## 2023-04-01 LAB — LIPID PANEL
Cholesterol: 157 mg/dL (ref 0–200)
HDL: 36.8 mg/dL — ABNORMAL LOW (ref >39.00)
LDL Cholesterol: 110 mg/dL — ABNORMAL HIGH (ref 0–99)
NonHDL: 120.57
Total CHOL/HDL Ratio: 4
Triglycerides: 55 mg/dL (ref 0.0–149.0)
VLDL: 11 mg/dL (ref 0.0–40.0)

## 2023-04-01 LAB — AST: AST: 12 U/L (ref 0–37)

## 2023-04-01 LAB — VITAMIN D 25 HYDROXY (VIT D DEFICIENCY, FRACTURES): VITD: 20.68 ng/mL — ABNORMAL LOW (ref 30.00–100.00)

## 2023-04-01 LAB — ALT: ALT: 16 U/L (ref 0–53)

## 2023-04-01 MED ORDER — PANTOPRAZOLE SODIUM 40 MG PO TBEC: 40.0000 mg | DELAYED_RELEASE_TABLET | Freq: Two times a day (BID) | ORAL | 2 refills | Status: AC

## 2023-04-01 NOTE — Assessment & Plan Note (Signed)
Here for CPX - Td -2015 - s/p shingrix x 2 -Recurrent pneumococcal vaccine in September and flu shot this fall. -- h/o  Prostate cancer : per urology --Colon cancer screening Cscope 04-2016, overdue for colonoscopy, referral 2023 failed, advised to call [facility] --Lung cancer screening: Has a FH of lung cancer, former smoker,  his father and 2 other family had lung cancer; see previous entries, referral for CT failed but he likes to try again.  Referral sent -FH lung, colon and prostate cancer: declined  genetic counseling before  --Labs:    AST ALT FLP vitamin D lung cancer screening --Diet and exercise discussed.  - Healthcare POA: See AVS

## 2023-04-01 NOTE — Progress Notes (Signed)
Subjective:    Patient ID: Danny [name], male    DOB: 02/11/1961, 62 y.o.   MRN: [id number]  DOS:  04/01/2023 Type of visit - description: CPX  Here for CPX  Multiple other symptoms and issues discussed. Heartburn continue, does not recall if he took PPIs. Previously reported upper extremity paresthesia, today he reports he is having an uneasy feeling at the upper and lower extremities, gabapentin is not helping.  Also reports back pain, this is going on for years, bilateral, no radiation, worse with certain activities.   Review of Systems  Other than above, a 14 point review of systems is negative     Past Medical History:  Diagnosis Date   Abnormal electrocardiogram    Abnormal glucose    Anxiety    Cigarette smoker    former smoker- none now    Erectile dysfunction    GERD (gastroesophageal reflux disease)    Paresthesia    Prostate cancer [facility])     Past Surgical History:  Procedure Laterality Date   LYMPHADENECTOMY Bilateral 04/27/2018   Procedure: LYMPHADENECTOMY, BILATERAL PELVIC;  Surgeon: Heloise Purpura, MD;  Location: [facility];  Service: Urology;  Laterality: Bilateral;   PROSTATE BIOPSY     ROBOT ASSISTED LAPAROSCOPIC RADICAL PROSTATECTOMY N/A 04/27/2018   Procedure: XI ROBOTIC ASSISTED LAPAROSCOPIC RADICAL PROSTATECTOMY LEVEL 2;  Surgeon: Heloise Purpura, MD;  Location: [facility];  Service: Urology;  Laterality: N/A;   Social History   Socioeconomic History   Marital status: Single    Spouse name: Not on file   Number of children: 2   Years of education: Not on file   Highest education level: Not on file  Occupational History   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  Tobacco Use   Smoking status: Former    Current packs/day: 0.00    Average packs/day: 0.5 packs/day for 10.0 years (5.0 ttl pk-yrs)    Types: Cigarettes    Start date: 08/19/1997    Quit date: 08/20/2007    Years since quitting: 15.6   Smokeless tobacco: Never   Tobacco comments:     Smoked on -off, 1990s until 2009 ~ 1 ppd   Vaping Use   Vaping status: Never Used  Substance and Sexual Activity   Alcohol use: Yes    Alcohol/week: 3.0 - 5.0 standard drinks of alcohol    Types: 2 Cans of beer, 1 - 3 Standard drinks or equivalent per week    Comment: beer -weekends   Drug use: No   Sexual activity: Yes    Comment: with the aid of Viagra  Other Topics Concern   Not on file  Social History Narrative   Lives by himself    Lost 1 child    Social Determinants of Health   Financial Resource Strain: Not on file  Food Insecurity: Not on file  Transportation Needs: Not on file  Physical Activity: Not on file  Stress: Not on file  Social Connections: Not on file  Intimate Partner Violence: Not on file     Current Outpatient Medications  Medication Instructions   gabapentin (NEURONTIN) 300 mg, Oral, Daily at bedtime   Multiple Vitamin (MULTIVITAMIN WITH MINERALS) TABS tablet 1 tablet, Oral, Daily   naproxen sodium (ALEVE) 220 mg, Oral, 2 times daily PRN   pantoprazole (PROTONIX) 40 mg, Oral, 2 times daily before meals   sildenafil (VIAGRA) 100 mg, Oral, Every other day   Vitamin D 2,000 Units, Daily  Objective:   Physical Exam BP 122/70 (BP Location: Left Arm, Patient Position: Sitting, Cuff Size: Normal)   Pulse 60   Temp 98.6 F (37 C) (Temporal)   Resp 18   Ht 5\' 7"  (1.702 m)   Wt 188 lb 12.8 oz (85.6 kg)   SpO2 98%   BMI 29.57 kg/m  General: Well developed, NAD, BMI noted Neck: No  thyromegaly  HEENT:  Normocephalic . Face symmetric, atraumatic Lungs:  CTA B Normal respiratory effort, no intercostal retractions, no accessory muscle use. Heart: RRR,  no murmur. MSK: No TTP of the lumbar spine Abdomen:  Not distended, soft, non-tender. No rebound or rigidity.   Lower extremities: no pretibial edema bilaterally  Skin: Exposed areas without rash. Not pale. Not jaundice Neurologic:  alert & oriented X3.  Speech normal, gait appropriate  for age and unassisted Strength symmetric and appropriate for age.  Psych: Cognition and judgment appear intact.  Cooperative with normal attention span and concentration.  Behavior appropriate. No anxious or depressed appearing.     Assessment     ASSESSMENT Prediabetes MSK: Cervical spondylosis Prostate cancer: -Prostatectomy 04-2018 - Biochemical recurrence, XRT February March 2024 Thyroid nodules per ultrasound 04-2015.  [place] 11/23/2016: Stable, no further routine [place] ED ++FH lun cancer  CTS -- NCS: 09/20/2022, B CTS, R>L .  Gabapentin helps  H/o Smoker  H/o early repol on EKG H/o anxiety   PLAN: Here for CPX - Td -2015 - s/p shingrix x 2 -Recurrent pneumococcal vaccine in September and flu shot this fall. -- h/o  Prostate cancer : per urology --Colon cancer screening Cscope 04-2016, overdue for colonoscopy, referral 2023 failed, advised to call [facility] --Lung cancer screening: Has a FH of lung cancer, former smoker,  his father and 2 other family had lung cancer; see previous entries, referral for CT failed but he likes to try again.  Referral sent -FH lung, colon and prostate cancer: declined  genetic counseling before  --Labs:    AST ALT FLP vitamin D lung cancer screening --Diet and exercise discussed.  - Healthcare POA: See AVS Multiple other issues discussed. Heartburn, see LOV, he does not know if he took pantoprazole, currently on Pepcid, heartburn continue.  Try pantoprazole 1 tablet twice daily, refer to GI, EGD for persistent heartburn?Marland Kitchen  Also due for colonoscopy. Generalized paresthesias: Previously reported upper extremity paresthesias, now reports upper and lower extremity paresthesias, and uneasy feeling, unclear etiology, refer back to neurology Low back pain: Going on for years, back in 2022 I recommended x-ray, I do not believe he pursued it however had a whole-body scan for history of prostate cancer in 2019 and it was negative.  He declined a PT referral back  in 2022 and again today, recommend Tylenol. Elevated BP: Reports BP was elevated yesterday, recommend to check at home.  BP is okay today. Low vitamin D.  Took ergocalciferol months ago, not on supplement, recommend to start 2000 units daily, check levels. RTC 8 months

## 2023-04-01 NOTE — Patient Instructions (Addendum)
Vaccines I recommend: COVID-vaccine in September Flu shot this fall   Check the  blood pressure regularly Blood pressure goal:  between 110/65 and  135/85. If it is consistently higher or lower, let me know  Take pantoprazole 40 mg 1 tablet twice daily before meals  Take over-the-counter vitamin D: 2000 units every day.  Will arrange a referral to gastroenterology: Please call them at [phone]. You will need a colonoscopy or possibly an upper endoscopy     GO TO THE LAB : Get the blood work     GO TO THE FRONT DESK, PLEASE SCHEDULE YOUR APPOINTMENTS Come back for a checkup in 8 months      Please read:  This is a summary of the advice provided today.  Please read if you have questions about today's visit.  You can always call us back for more information.  If you have MyChart, please use it. Let the APP send you alerts and  reminders.   If you are not planning to use MyChart, please deactivate it.  If you have it and don't use it, you won't see my comments about your results.  If I order blood work, x-rays or referrals and you do not see your results or don't hear about the referrals within a week:  please call my office.

## 2023-04-01 NOTE — Assessment & Plan Note (Signed)
Here for CPX  Multiple other issues discussed. Heartburn, see LOV, he does not know if he took pantoprazole, currently on Pepcid, heartburn continue.  Try pantoprazole 1 tablet twice daily, refer to GI, EGD for persistent heartburn?Marland Kitchen  Also due for colonoscopy. Generalized paresthesias: Previously reported upper extremity paresthesias, now reports upper and lower extremity paresthesias, and uneasy feeling, unclear etiology, refer back to neurology Low back pain: Going on for years, back in 2022 I recommended x-ray, I do not believe he pursued it however had a whole-body scan for history of prostate cancer in 2019 and it was negative.  He declined a PT referral back in 2022 and again today, recommend Tylenol. Elevated BP: Reports BP was elevated yesterday, recommend to check at home.  BP is okay today. Low vitamin D.  Took ergocalciferol months ago, not on supplement, recommend to start 2000 units daily, check levels. RTC 8 months

## 2023-04-03 ENCOUNTER — Encounter: Payer: Self-pay | Admitting: Gastroenterology

## 2023-04-04 ENCOUNTER — Telehealth: Payer: Self-pay | Admitting: Internal Medicine

## 2023-04-04 ENCOUNTER — Other Ambulatory Visit: Payer: Self-pay

## 2023-04-04 MED ORDER — VITAMIN D (ERGOCALCIFEROL) 1.25 MG (50000 UNIT) PO CAPS: 50000.0000 [IU] | ORAL_CAPSULE | ORAL | 0 refills | Status: AC

## 2023-04-04 NOTE — Addendum Note (Signed)
Addended byConrad Blakely D on: 04/04/2023 01:26 PM   Modules accepted: Orders

## 2023-04-04 NOTE — Addendum Note (Signed)
Addended byConrad  D on: 04/04/2023 01:24 PM   Modules accepted: Orders

## 2023-04-22 ENCOUNTER — Ambulatory Visit (AMBULATORY_SURGERY_CENTER): Payer: 59

## 2023-04-22 VITALS — Ht 67.0 in | Wt 185.0 lb

## 2023-04-22 DIAGNOSIS — Z86010 Personal history of colonic polyps: Secondary | ICD-10-CM

## 2023-04-22 MED ORDER — NA SULFATE-K SULFATE-MG SULF 17.5-3.13-1.6 GM/177ML PO SOLN
1.0000 | Freq: Once | ORAL | 0 refills | Status: AC
Start: 2023-04-22 — End: 2023-04-22

## 2023-04-22 NOTE — Progress Notes (Signed)

## 2023-05-15 ENCOUNTER — Encounter: Payer: Self-pay | Admitting: Gastroenterology

## 2023-05-20 ENCOUNTER — Encounter: Payer: 59 | Admitting: Gastroenterology

## 2023-05-22 ENCOUNTER — Ambulatory Visit: Payer: 59 | Admitting: Gastroenterology

## 2023-05-22 ENCOUNTER — Encounter: Payer: Self-pay | Admitting: Gastroenterology

## 2023-05-22 VITALS — BP 107/58 | HR 64 | Temp 97.5°F | Resp 15 | Ht 67.0 in | Wt 185.0 lb

## 2023-05-22 DIAGNOSIS — D123 Benign neoplasm of transverse colon: Secondary | ICD-10-CM

## 2023-05-22 DIAGNOSIS — Z860101 Personal history of adenomatous and serrated colon polyps: Secondary | ICD-10-CM | POA: Diagnosis not present

## 2023-05-22 DIAGNOSIS — Z09 Encounter for follow-up examination after completed treatment for conditions other than malignant neoplasm: Secondary | ICD-10-CM | POA: Diagnosis present

## 2023-05-22 MED ORDER — SODIUM CHLORIDE 0.9 % IV SOLN: 500.0000 mL | INTRAVENOUS | Status: DC

## 2023-05-22 NOTE — Progress Notes (Signed)
Patient states there have been no changes to medical or surgical history since time of pre-visit. 

## 2023-05-22 NOTE — Patient Instructions (Signed)

## 2023-05-22 NOTE — Progress Notes (Signed)
History & Physical  Primary Care Physician:  Wanda Plump, MD Primary Gastroenterologist: Claudette Head, MD  Impression / Plan:  Personal history of adenomatous colon polyps for surveillance colonoscopy.  CHIEF COMPLAINT:  Personal history of colon polyps   HPI: Danny [name] is a 62 y.o. male with a personal history of adenomatous colon polyps for surveillance colonoscopy.    Past Medical History:  Diagnosis Date   Abnormal electrocardiogram    Abnormal glucose    Cigarette smoker    former smoker- none now    Erectile dysfunction    GERD (gastroesophageal reflux disease)    Paresthesia    Prostate cancer [facility])     Past Surgical History:  Procedure Laterality Date   LYMPHADENECTOMY Bilateral 04/27/2018   Procedure: LYMPHADENECTOMY, BILATERAL PELVIC;  Surgeon: Heloise Purpura, MD;  Location: [facility];  Service: Urology;  Laterality: Bilateral;   PROSTATE BIOPSY     ROBOT ASSISTED LAPAROSCOPIC RADICAL PROSTATECTOMY N/A 04/27/2018   Procedure: XI ROBOTIC ASSISTED LAPAROSCOPIC RADICAL PROSTATECTOMY LEVEL 2;  Surgeon: Heloise Purpura, MD;  Location: [facility];  Service: Urology;  Laterality: N/A;    Prior to Admission medications   Medication Sig Start Date End Date Taking? Authorizing Provider  Cholecalciferol (VITAMIN D) 50 MCG (2000 UT) CAPS Take 2,000 Units by mouth daily.   Yes [provider]  Multiple Vitamin (MULTIVITAMIN WITH MINERALS) TABS tablet Take 1 tablet by mouth daily.   Yes [provider]  naproxen sodium (ALEVE) 220 MG tablet Take 1 tablet (220 mg total) by mouth 2 (two) times daily as needed. 02/11/19  Yes Paz, Nolon Rod, MD  pantoprazole (PROTONIX) 40 MG tablet Take 1 tablet (40 mg total) by mouth 2 (two) times daily before a meal. 04/01/23  Yes Paz, Nolon Rod, MD  Vitamin D, Ergocalciferol, (DRISDOL) 1.25 MG (50000 UNIT) CAPS capsule Take 1 capsule (50,000 Units total) by mouth every 7 (seven) days. 04/04/23 06/27/23 Yes Paz, Nolon Rod, MD  gabapentin  (NEURONTIN) 100 MG capsule Take 3 capsules (300 mg total) by mouth at bedtime. 01/20/23   Wanda Plump, MD  sildenafil (VIAGRA) 100 MG tablet Take 100 mg by mouth every other day. Patient not taking: Reported on 04/22/2023    [provider]    Current Outpatient Medications  Medication Sig Dispense Refill   Cholecalciferol (VITAMIN D) 50 MCG (2000 UT) CAPS Take 2,000 Units by mouth daily.     Multiple Vitamin (MULTIVITAMIN WITH MINERALS) TABS tablet Take 1 tablet by mouth daily.     naproxen sodium (ALEVE) 220 MG tablet Take 1 tablet (220 mg total) by mouth 2 (two) times daily as needed. 60 tablet 4   pantoprazole (PROTONIX) 40 MG tablet Take 1 tablet (40 mg total) by mouth 2 (two) times daily before a meal. 60 tablet 2   Vitamin D, Ergocalciferol, (DRISDOL) 1.25 MG (50000 UNIT) CAPS capsule Take 1 capsule (50,000 Units total) by mouth every 7 (seven) days. 12 capsule 0   gabapentin (NEURONTIN) 100 MG capsule Take 3 capsules (300 mg total) by mouth at bedtime. 90 capsule 0   sildenafil (VIAGRA) 100 MG tablet Take 100 mg by mouth every other day. (Patient not taking: Reported on 04/22/2023)     Current Facility-Administered Medications  Medication Dose Route Frequency Provider Last Rate Last Admin   0.9 %  sodium chloride infusion  500 mL Intravenous Continuous Meryl Dare, MD        Allergies as of 05/22/2023   (No  Known Allergies)    Family History  Problem Relation Age of Onset   Lung cancer Father 15       smoker   Leukemia Brother    Prostate cancer Maternal Uncle        metastatic   Prostate cancer Maternal Uncle    Prostate cancer Maternal Grandfather    Colon cancer Paternal Grandmother 9 - 30   Lung cancer Paternal Grandfather        smoked   CAD Neg Hx    Stroke Neg Hx    Diabetes Neg Hx    Colon polyps Neg Hx    Esophageal cancer Neg Hx    Rectal cancer Neg Hx    Stomach cancer Neg Hx     Social History   Socioeconomic History   Marital status:  Single    Spouse name: Not on file   Number of children: 2   Years of education: Not on file   Highest education level: Not on file  Occupational History   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  Tobacco Use   Smoking status: Former    Current packs/day: 0.00    Average packs/day: 0.5 packs/day for 10.0 years (5.0 ttl pk-yrs)    Types: Cigarettes    Start date: 08/19/1997    Quit date: 08/20/2007    Years since quitting: 15.7   Smokeless tobacco: Never   Tobacco comments:    Smoked on -off, 1990s until 2009 ~ 1 ppd   Vaping Use   Vaping status: Never Used  Substance and Sexual Activity   Alcohol use: Yes    Alcohol/week: 3.0 - 5.0 standard drinks of alcohol    Types: 2 Cans of beer, 1 - 3 Standard drinks or equivalent per week    Comment: beer -weekends   Drug use: No   Sexual activity: Yes    Comment: with the aid of Viagra  Other Topics Concern   Not on file  Social History Narrative   Lives by himself    Lost 1 child    Social Determinants of Health   Financial Resource Strain: Not on file  Food Insecurity: Not on file  Transportation Needs: Not on file  Physical Activity: Not on file  Stress: Not on file  Social Connections: Not on file  Intimate Partner Violence: Not on file    Review of Systems:  All systems reviewed were negative except where noted in HPI.   Physical Exam:  General:  Alert, well-developed, in NAD Head:  Normocephalic and atraumatic. Eyes:  Sclera clear, no icterus.   Conjunctiva pink. Ears:  Normal auditory acuity. Mouth:  No deformity or lesions.  Neck:  Supple; no masses. Lungs:  Clear throughout to auscultation.   No wheezes, crackles, or rhonchi.  Heart:  Regular rate and rhythm; no murmurs. Abdomen:  Soft, nondistended, nontender. No masses, hepatomegaly. No palpable masses.  Normal bowel sounds.    Rectal:  Deferred   Msk:  Symmetrical without gross deformities. Extremities:  Without edema. Neurologic:  Alert and   oriented x 4; grossly normal neurologically. Skin:  Intact without significant lesions or rashes. Psych:  Alert and cooperative. Normal mood and affect.   Venita Lick. Russella Dar  05/22/2023, 9:43 AM See Loretha Stapler,  GI, to contact our on call provider

## 2023-05-22 NOTE — Progress Notes (Signed)
Report to PACU, RN, vss, BBS= Clear.  

## 2023-05-22 NOTE — Op Note (Signed)
[facility] Patient Name: Danny [name] Procedure Date: 05/22/2023 9:43 AM MRN: [id number] Endoscopist: Meryl Dare , MD, [phone] Age: 61 Referring MD:  Date of Birth: 08-12-1961 Gender: Male Account #: [id number] Procedure:                Colonoscopy Indications:              Surveillance: Personal history of adenomatous                            polyps on last colonoscopy > 5 years ago Medicines:                Monitored Anesthesia Care Procedure:                Pre-Anesthesia Assessment:                           - Prior to the procedure, a History and Physical                            was performed, and patient medications and                            allergies were reviewed. The patient's tolerance of                            previous anesthesia was also reviewed. The risks                            and benefits of the procedure and the sedation                            options and risks were discussed with the patient.                            All questions were answered, and informed consent                            was obtained. Prior Anticoagulants: The patient has                            taken no anticoagulant or antiplatelet agents. ASA                            Grade Assessment: II - A patient with mild systemic                            disease. After reviewing the risks and benefits,                            the patient was deemed in satisfactory condition to                            undergo the procedure.  After obtaining informed consent, the colonoscope                            was passed under direct vision. Throughout the                            procedure, the patient's blood pressure, pulse, and                            oxygen saturations were monitored continuously. The                            Olympus Scope [id number] was introduced through the                            anus and advanced  to the the cecum, identified by                            appendiceal orifice and ileocecal valve. The                            ileocecal valve, appendiceal orifice, and rectum                            were photographed. The quality of the bowel                            preparation was excellent. The colonoscopy was                            performed without difficulty. The patient tolerated                            the procedure well. Scope In: 9:50:43 AM Scope Out: 10:05:58 AM Scope Withdrawal Time: 0 hours 12 minutes 9 seconds  Total Procedure Duration: 0 hours 15 minutes 15 seconds  Findings:                 The perianal and digital rectal examinations were                            normal.                           A 7 mm polyp was found in the transverse colon. The                            polyp was sessile. The polyp was removed with a                            cold snare. Resection and retrieval were complete.                           External hemorrhoids were found during  retroflexion. The hemorrhoids were small.                           The exam was otherwise without abnormality on                            direct and retroflexion views. Complications:            No immediate complications. Estimated blood loss:                            None. Estimated Blood Loss:     Estimated blood loss: none. Impression:               - One 7 mm polyp in the transverse colon, removed                            with a cold snare. Resected and retrieved.                           - External hemorrhoids.                           - The examination was otherwise normal on direct                            and retroflexion views. Recommendation:           - Repeat colonoscopy after studies are complete for                            surveillance based on pathology results.                           - Patient has a contact number available for                             emergencies. The signs and symptoms of potential                            delayed complications were discussed with the                            patient. Return to normal activities tomorrow.                            Written discharge instructions were provided to the                            patient.                           - Resume previous diet.                           - Continue present medications.                           -  Await pathology results. Meryl Dare, MD 05/22/2023 10:09:07 AM This report has been signed electronically.

## 2023-05-22 NOTE — Progress Notes (Signed)
Called to room to assist during endoscopic procedure.  Patient ID and intended procedure confirmed with present staff. Received instructions for my participation in the procedure from the performing physician.  

## 2023-05-23 ENCOUNTER — Telehealth: Payer: Self-pay

## 2023-05-23 NOTE — Telephone Encounter (Signed)
  Follow up Call-     05/22/2023    8:38 AM  Call back number  Post procedure Call Back phone  # [phone]  Permission to leave phone message Yes     Patient questions:  Do you have a fever, pain , or abdominal swelling? No. Pain Score  0 *  Have you tolerated food without any problems? Yes.    Have you been able to return to your normal activities? Yes.    Do you have any questions about your discharge instructions: Diet   No. Medications  No. Follow up visit  No.  Do you have questions or concerns about your Care? No.  Actions: * If pain score is 4 or above: No action needed, pain <4.

## 2023-05-27 LAB — SURGICAL PATHOLOGY

## 2023-06-05 ENCOUNTER — Encounter: Payer: Self-pay | Admitting: Gastroenterology

## 2023-08-29 ENCOUNTER — Encounter: Payer: Self-pay | Admitting: Internal Medicine

## 2023-08-29 ENCOUNTER — Ambulatory Visit: Payer: 59 | Admitting: Internal Medicine

## 2023-08-29 VITALS — BP 132/80 | HR 91 | Temp 98.1°F | Resp 16 | Ht 67.0 in | Wt 193.1 lb

## 2023-08-29 DIAGNOSIS — R202 Paresthesia of skin: Secondary | ICD-10-CM | POA: Diagnosis not present

## 2023-08-29 DIAGNOSIS — N529 Male erectile dysfunction, unspecified: Secondary | ICD-10-CM | POA: Diagnosis not present

## 2023-08-29 MED ORDER — GABAPENTIN 100 MG PO CAPS: 100.0000 mg | ORAL_CAPSULE | Freq: Three times a day (TID) | ORAL | 1 refills | Status: DC

## 2023-08-29 MED ORDER — SILDENAFIL CITRATE 100 MG PO TABS: 100.0000 mg | ORAL_TABLET | ORAL | 1 refills | Status: AC

## 2023-08-29 NOTE — Progress Notes (Signed)
   Subjective:    Patient ID: Danny [name], male    DOB: [birth year], 63 y.o.   MRN: [id number]  DOS:  08/29/2023 Type of visit - description: Acute  Has a long history of paresthesias. They happen  on and off, at BUE and BLE. Lately they have returned and patient is uncomfortable. Not taking gabapentin  except 1 time at night 3 days ago and it helped to some extent. Denies any particular neck or back pain. No headache, fever.  No weight loss.   Review of Systems See above   Past Medical History:  Diagnosis Date   Abnormal electrocardiogram    Abnormal glucose    Cigarette smoker    former smoker- none now    Erectile dysfunction    GERD (gastroesophageal reflux disease)    Paresthesia    Prostate cancer [facility])     Past Surgical History:  Procedure Laterality Date   LYMPHADENECTOMY Bilateral 04/27/2018   Procedure: LYMPHADENECTOMY, BILATERAL PELVIC;  Surgeon: Renda Glance, MD;  Location: [facility];  Service: Urology;  Laterality: Bilateral;   PROSTATE BIOPSY     ROBOT ASSISTED LAPAROSCOPIC RADICAL PROSTATECTOMY N/A 04/27/2018   Procedure: XI ROBOTIC ASSISTED LAPAROSCOPIC RADICAL PROSTATECTOMY LEVEL 2;  Surgeon: Renda Glance, MD;  Location: [facility];  Service: Urology;  Laterality: N/A;    Current Outpatient Medications  Medication Instructions   gabapentin  (NEURONTIN ) 300 mg, Oral, Daily at bedtime   Multiple Vitamin (MULTIVITAMIN WITH MINERALS) TABS tablet 1 tablet, Daily   naproxen  sodium (ALEVE ) 220 mg, Oral, 2 times daily PRN   pantoprazole  (PROTONIX ) 40 mg, Oral, 2 times daily before meals   sildenafil  (VIAGRA ) 100 mg, Every other day   Vitamin D  2,000 Units, Daily       Objective:   Physical Exam BP 132/80   Pulse 91   Temp 98.1 F (36.7 C) (Oral)   Resp 16   Ht 5' 7 (1.702 m)   Wt 193 lb 2 oz (87.6 kg)   SpO2 98%   BMI 30.25 kg/m  General:   Well developed, NAD, BMI noted. HEENT:  Normocephalic . Face symmetric, atraumatic Cervical spine: Range  of motion normal, no TTP Heart: RRR,  no murmur.  Lower extremities: no pretibial edema bilaterally  Skin: Not pale. Not jaundice Neurologic:  alert & oriented X3.  Speech normal, gait appropriate for age and unassisted. Motor and DTR symmetric.      Assessment     ASSESSMENT Prediabetes MSK: Cervical spondylosis Prostate cancer: -Prostatectomy 04-2018 - Biochemical recurrence, XRT February March 2024 Thyroid  nodules per ultrasound 04-2015.  US  11/23/2016: Stable, no further routine US  ED ++FH lun cancer  CTS -- NCS: 09/20/2022, B CTS, R>L .  Gabapentin  helps  H/o Smoker  H/o early repol on EKG H/o anxiety   PLAN: Generalized paresthesias: started July 2023, follow-up blood work was essentially okay, saw neurology 06/17/2022, they recommend EMG/NCS, Dx bilateral CTS, was Rx gabapentin .  They were considering MRI but that has not been done to my knowledge. Since August 2024, the paresthesias are not only on the upper extremities but also on the lower extremities. Has not been taking gabapentin  except for 3 nights ago but reports that it helps.  Change gabapentin  from at bedtime to TID.rx sent  Neuro referral for re-eval ED: Refill sildenafil  RTC CPX 10-2022

## 2023-08-29 NOTE — Patient Instructions (Addendum)
 We are referring you to neurology.  Take gabapentin 1 tablet 3 times a day regularly.  We are sending a prescription.  GO TO THE LAB : Get the blood work     Next visit with me by August 2025 for a physical exam Please schedule it at the front desk

## 2023-08-31 NOTE — Assessment & Plan Note (Signed)
 Generalized paresthesias: started July 2023, follow-up blood work was essentially okay, saw neurology 06/17/2022, they recommend EMG/NCS, Dx bilateral CTS, was Rx gabapentin .  They were considering MRI but that has not been done to my knowledge. Since August 2024, the paresthesias are not only on the upper extremities but also on the lower extremities. Has not been taking gabapentin  except for 3 nights ago but reports that it helps.  Change gabapentin  from at bedtime to TID.rx sent  Neuro referral for re-eval ED: Refill sildenafil  RTC CPX 10-2022

## 2023-10-13 LAB — PSA: PSA: 0.5

## 2023-10-14 IMAGING — CT CT RENAL STONE PROTOCOL
2 of 4 series · 16 of 46 positions shown, 18 images · non-contrast
Comparison: 02/25/2018.

CLINICAL DATA: Flank pain, kidney stone suspected.



[Series 3: ap without · axial · non-contrast · 0.72mm/px · z∈[+191,+571]mm · 13 of 86 slices shown, 15 images]
[im 5/86  soft-tissue]
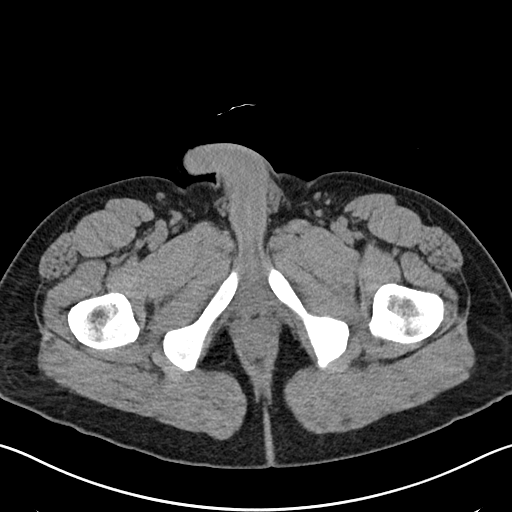
[im 5/86  bone]
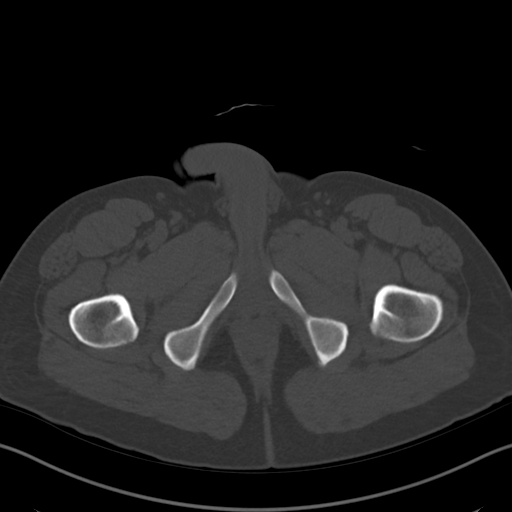
[im 10/86  soft-tissue]
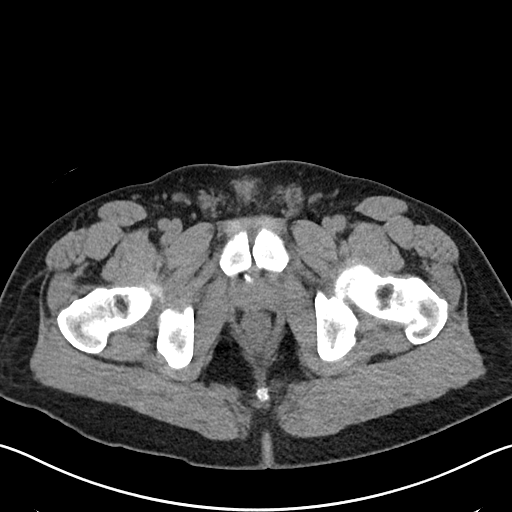
[im 19/86  soft-tissue]
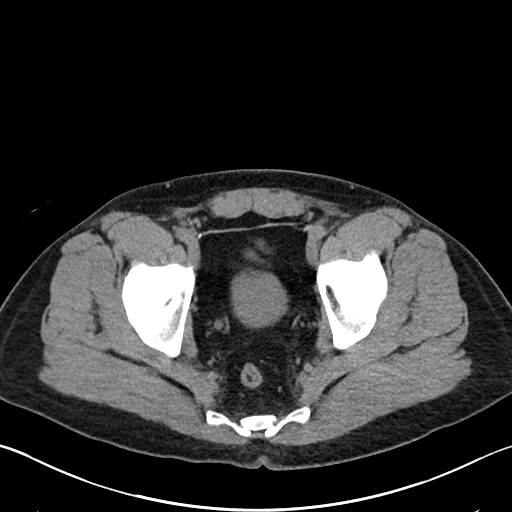
[im 24/86  soft-tissue]
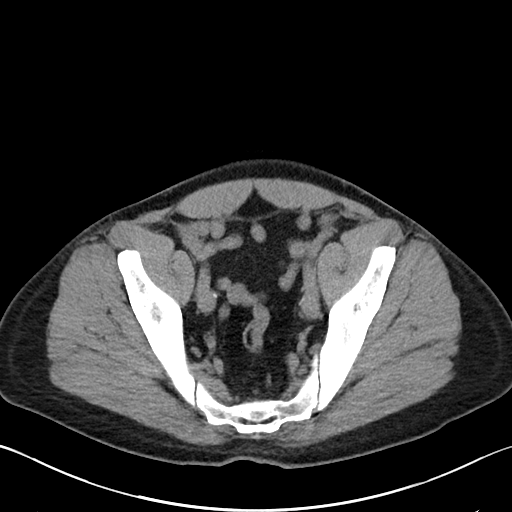
[im 29/86  soft-tissue]
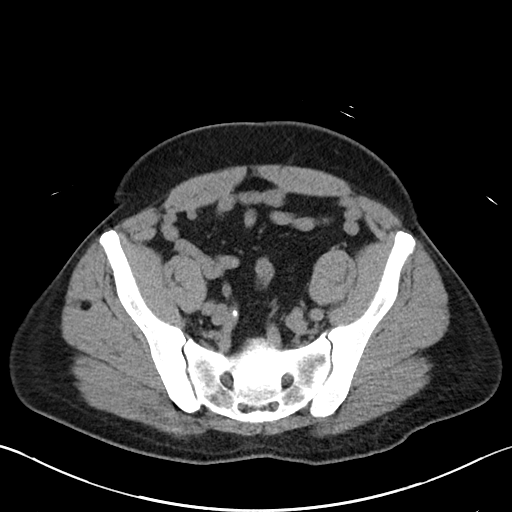
[im 38/86  soft-tissue]
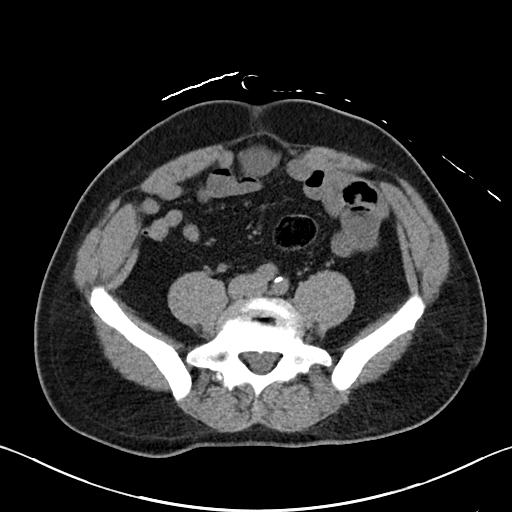
[im 43/86  soft-tissue]
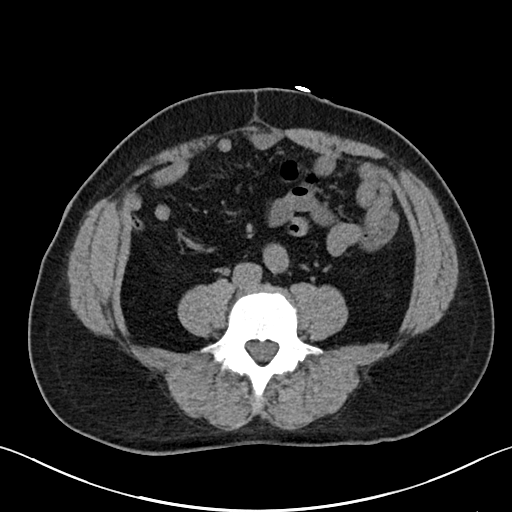
[im 48/86  soft-tissue]
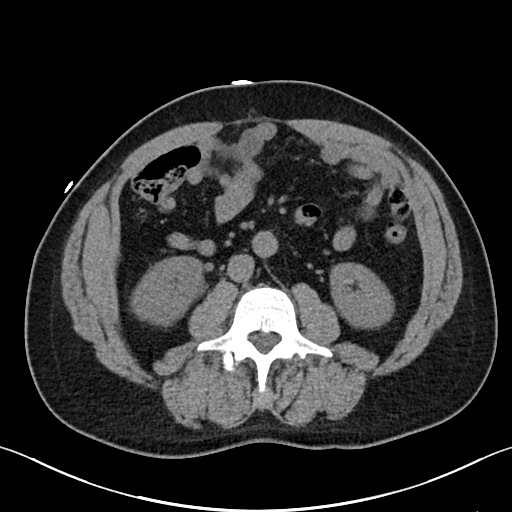
[im 57/86  soft-tissue]
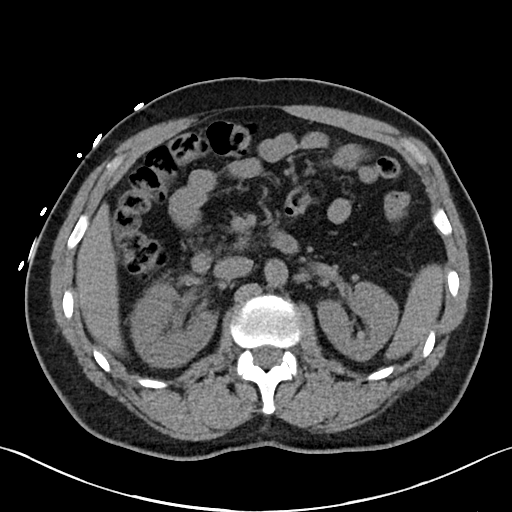
[im 57/86  bone]
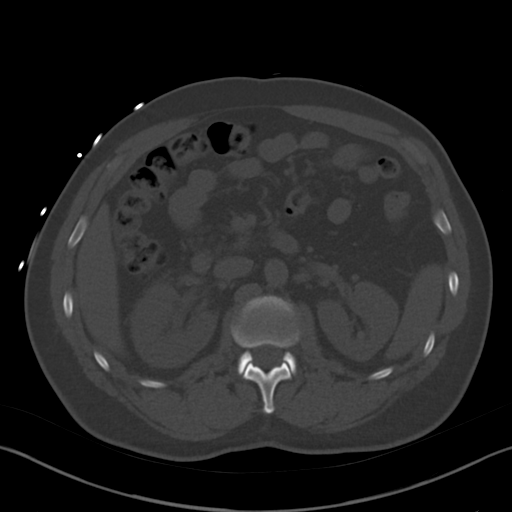
[im 62/86  soft-tissue]
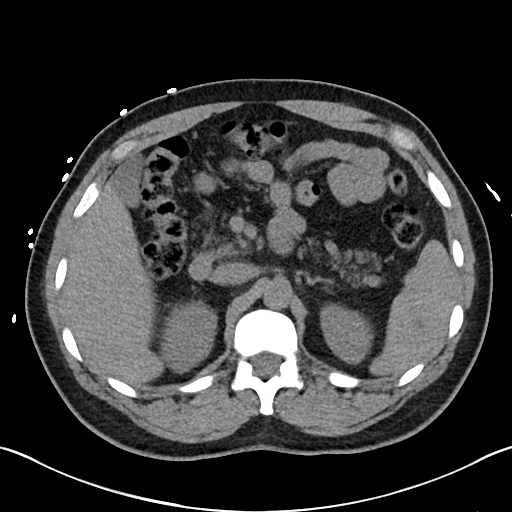
[im 67/86  soft-tissue]
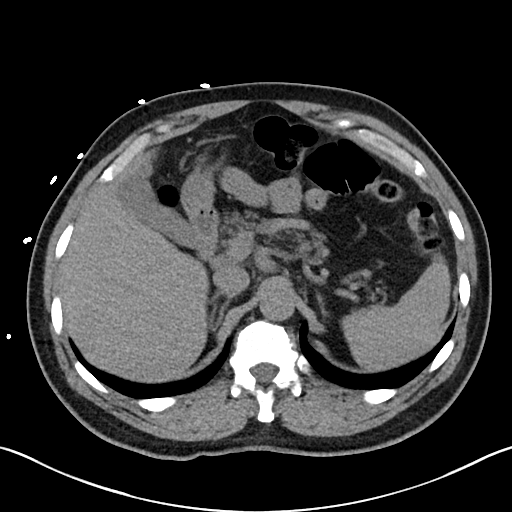
[im 76/86  soft-tissue]
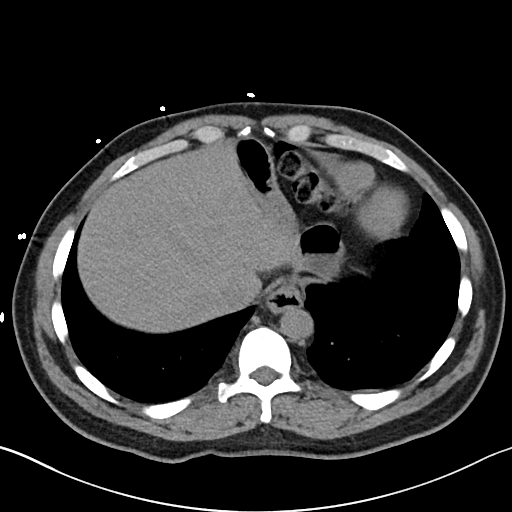
[im 81/86  soft-tissue]
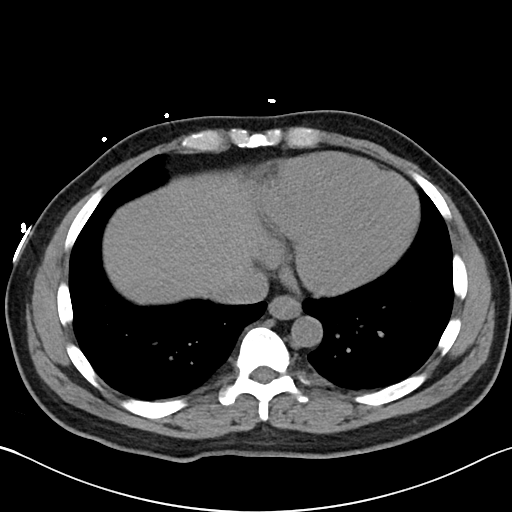

[Series 6: cor · coronal · 0.79mm/px · 3 of 96 slices shown]
[im 32/96  soft-tissue]
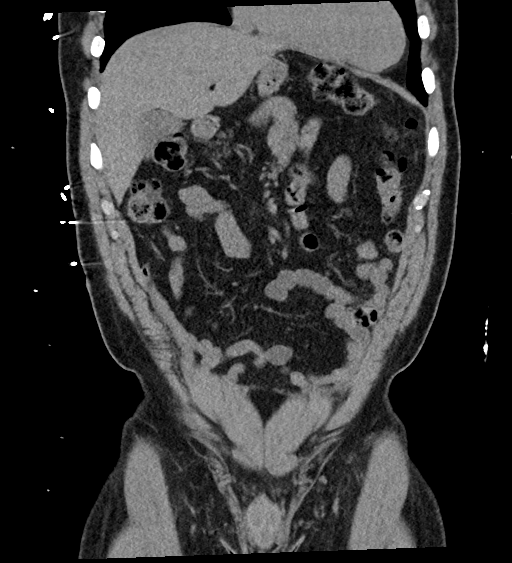
[im 43/96  soft-tissue]
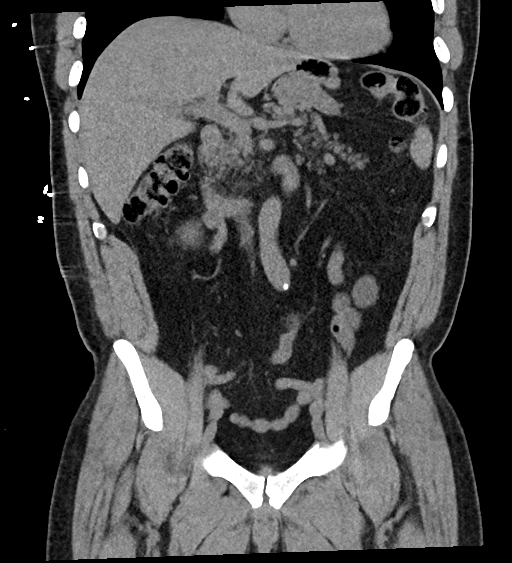
[im 53/96  soft-tissue]
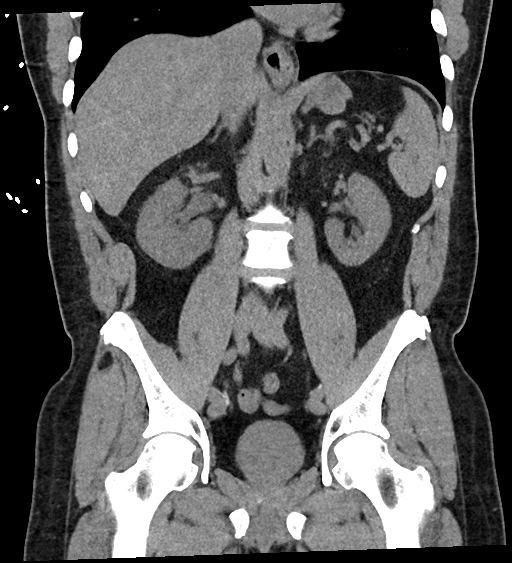

[16 of 46 positions shown; findings below may reference images not displayed]

FINDINGS: Lower chest: Mild atelectasis at the lung bases.

Hepatobiliary: No focal liver abnormality is seen. No gallstones,
gallbladder wall thickening, or biliary dilatation.

Pancreas: Fatty atrophy of the pancreas is noted. No pancreatic
ductal dilatation or surrounding inflammatory changes.

Spleen: Normal in size without focal abnormality.

Adrenals/Urinary Tract: No adrenal nodule or mass. A nonobstructive
renal calculus is noted on the right. There is mild
hydroureteronephrosis on the right with 2 stones in the urinary
bladder on the right measuring up to 3 mm. There is mild perinephric
and periureteral fat stranding on the right which is likely related
to recently passed stone. No renal calculus or obstructive uropathy
on the left.

Stomach/Bowel: Small hiatal hernia is present. The stomach is
otherwise within normal limits. Appendix appears normal. No evidence
of bowel wall thickening, distention, or inflammatory changes. No
free air or pneumatosis.

Vascular/Lymphatic: Aortic atherosclerosis. No enlarged abdominal or
pelvic lymph nodes.

Reproductive: Prostate is unremarkable.

Other: No abdominopelvic ascites. A fat containing umbilical hernia
is noted.

Musculoskeletal: Degenerative changes are present in the
thoracolumbar spine. No acute osseous abnormality.
IMPRESSION: 1. Mild hydroureteronephrosis on the right with 2 calcifications in
the urinary bladder on the right measuring up to 3 mm, suggesting
recently passed stones.
2. Right renal calculus.
3. Aortic atherosclerosis.

## 2023-10-24 ENCOUNTER — Encounter: Payer: Self-pay | Admitting: Internal Medicine

## 2023-12-01 ENCOUNTER — Ambulatory Visit: Payer: 59 | Admitting: Internal Medicine

## 2024-04-02 ENCOUNTER — Ambulatory Visit (INDEPENDENT_AMBULATORY_CARE_PROVIDER_SITE_OTHER): Payer: 59 | Admitting: Internal Medicine

## 2024-04-02 ENCOUNTER — Encounter: Payer: Self-pay | Admitting: Internal Medicine

## 2024-04-02 VITALS — BP 128/80 | HR 60 | Temp 98.0°F | Resp 16 | Ht 67.0 in | Wt 191.1 lb

## 2024-04-02 DIAGNOSIS — R739 Hyperglycemia, unspecified: Secondary | ICD-10-CM | POA: Diagnosis not present

## 2024-04-02 DIAGNOSIS — Z801 Family history of malignant neoplasm of trachea, bronchus and lung: Secondary | ICD-10-CM

## 2024-04-02 DIAGNOSIS — R5383 Other fatigue: Secondary | ICD-10-CM | POA: Diagnosis not present

## 2024-04-02 DIAGNOSIS — F411 Generalized anxiety disorder: Secondary | ICD-10-CM

## 2024-04-02 DIAGNOSIS — Z8042 Family history of malignant neoplasm of prostate: Secondary | ICD-10-CM

## 2024-04-02 DIAGNOSIS — R202 Paresthesia of skin: Secondary | ICD-10-CM | POA: Diagnosis not present

## 2024-04-02 DIAGNOSIS — Z0001 Encounter for general adult medical examination with abnormal findings: Secondary | ICD-10-CM

## 2024-04-02 DIAGNOSIS — Z0189 Encounter for other specified special examinations: Secondary | ICD-10-CM

## 2024-04-02 DIAGNOSIS — Z7185 Encounter for immunization safety counseling: Secondary | ICD-10-CM

## 2024-04-02 DIAGNOSIS — E559 Vitamin D deficiency, unspecified: Secondary | ICD-10-CM | POA: Diagnosis not present

## 2024-04-02 DIAGNOSIS — C61 Malignant neoplasm of prostate: Secondary | ICD-10-CM

## 2024-04-02 DIAGNOSIS — Z0000 Encounter for general adult medical examination without abnormal findings: Secondary | ICD-10-CM

## 2024-04-02 DIAGNOSIS — Z800 Family history of malignant neoplasm of digestive organs: Secondary | ICD-10-CM

## 2024-04-02 LAB — HEMOGLOBIN A1C: Hgb A1c MFr Bld: 6.4 % (ref 4.6–6.5)

## 2024-04-02 LAB — COMPREHENSIVE METABOLIC PANEL WITH GFR
ALT: 17 U/L (ref 0–53)
AST: 14 U/L (ref 0–37)
Albumin: 4.1 g/dL (ref 3.5–5.2)
Alkaline Phosphatase: 73 U/L (ref 39–117)
BUN: 16 mg/dL (ref 6–23)
CO2: 31 meq/L (ref 19–32)
Calcium: 8.8 mg/dL (ref 8.4–10.5)
Chloride: 102 meq/L (ref 96–112)
Creatinine, Ser: 0.81 mg/dL (ref 0.40–1.50)
GFR: 95.74 mL/min (ref >60.00)
Glucose, Bld: 98 mg/dL (ref 70–99)
Potassium: 3.9 meq/L (ref 3.5–5.1)
Sodium: 140 meq/L (ref 135–145)
Total Bilirubin: 1.2 mg/dL (ref 0.2–1.2)
Total Protein: 6.6 g/dL (ref 6.0–8.3)

## 2024-04-02 LAB — CBC WITH DIFFERENTIAL/PLATELET
Basophils Absolute: 0 K/uL (ref 0.0–0.1)
Basophils Relative: 0.8 % (ref 0.0–3.0)
Eosinophils Absolute: 0.2 K/uL (ref 0.0–0.7)
Eosinophils Relative: 4.3 % (ref 0.0–5.0)
HCT: 41 % (ref 39.0–52.0)
Hemoglobin: 13.8 g/dL (ref 13.0–17.0)
Lymphocytes Relative: 22.6 % (ref 12.0–46.0)
Lymphs Abs: 0.9 K/uL (ref 0.7–4.0)
MCHC: 33.6 g/dL (ref 30.0–36.0)
MCV: 90.1 fl (ref 78.0–100.0)
Monocytes Absolute: 0.6 K/uL (ref 0.1–1.0)
Monocytes Relative: 16.1 % — ABNORMAL HIGH (ref 3.0–12.0)
Neutro Abs: 2.1 K/uL (ref 1.4–7.7)
Neutrophils Relative %: 56.2 % (ref 43.0–77.0)
Platelets: 269 K/uL (ref 150.0–400.0)
RBC: 4.55 Mil/uL (ref 4.22–5.81)
RDW: 13.7 % (ref 11.5–15.5)
WBC: 3.8 K/uL — ABNORMAL LOW (ref 4.0–10.5)

## 2024-04-02 LAB — LIPID PANEL
Cholesterol: 185 mg/dL (ref 0–200)
HDL: 38.5 mg/dL — ABNORMAL LOW (ref >39.00)
LDL Cholesterol: 133 mg/dL — ABNORMAL HIGH (ref 0–99)
NonHDL: 146.43
Total CHOL/HDL Ratio: 5
Triglycerides: 65 mg/dL (ref 0.0–149.0)
VLDL: 13 mg/dL (ref 0.0–40.0)

## 2024-04-02 LAB — B12 AND FOLATE PANEL
Folate: 8.3 ng/mL (ref >5.9)
Vitamin B-12: 250 pg/mL (ref 211–911)

## 2024-04-02 LAB — VITAMIN D 25 HYDROXY (VIT D DEFICIENCY, FRACTURES): VITD: 17.34 ng/mL — ABNORMAL LOW (ref 30.00–100.00)

## 2024-04-02 LAB — TSH: TSH: 2.37 u[IU]/mL (ref 0.35–5.50)

## 2024-04-02 MED ORDER — GABAPENTIN 100 MG PO CAPS: 100.0000 mg | ORAL_CAPSULE | Freq: Three times a day (TID) | ORAL | Status: AC | PRN

## 2024-04-02 NOTE — Assessment & Plan Note (Signed)
 Here for CPX - Td -2015 - s/p shingrix  x 2 -Rec covid and d flu shot this fall. -- h/o  Prostate cancer : per urology --Colon cancer screening Cscope 04-2016, cscope 05/2023  --Lung cancer screening: Has a FH of lung cancer, former smoker, see previous entries, referrals failed. -FH lung, colon and prostate cancer: declined  genetic counseling before  --Labs:   CMP FLP CBC A1c TSH vitamin D  B12 folic acid  --Diet and exercise discussed.  - Healthcare POA: See AVS

## 2024-04-02 NOTE — Progress Notes (Signed)
 Subjective:    Patient ID: Danny [name], male    DOB: 1961-01-15, 63 y.o.   MRN: [id number]  DOS:  04/02/2024 Type of visit - description: CPX  Here for CPX. Chronic medical problems addressed. His only complaint is I would like more energy. PHQ 2: Negative. Denies any particular stress. Denies snoring but admits that sometimes falls asleep very easily. No chest pain, shortness of breath. No GI or GU symptoms.   Review of Systems  Other than above, a 14 point review of systems is negative     Past Medical History:  Diagnosis Date   Abnormal electrocardiogram    Abnormal glucose    Cigarette smoker    former smoker- none now    Erectile dysfunction    GERD (gastroesophageal reflux disease)    Paresthesia    Prostate cancer [facility])     Past Surgical History:  Procedure Laterality Date   LYMPHADENECTOMY Bilateral 04/27/2018   Procedure: LYMPHADENECTOMY, BILATERAL PELVIC;  Surgeon: Renda Glance, MD;  Location: [facility];  Service: Urology;  Laterality: Bilateral;   PROSTATE BIOPSY     ROBOT ASSISTED LAPAROSCOPIC RADICAL PROSTATECTOMY N/A 04/27/2018   Procedure: XI ROBOTIC ASSISTED LAPAROSCOPIC RADICAL PROSTATECTOMY LEVEL 2;  Surgeon: Renda Glance, MD;  Location: [facility];  Service: Urology;  Laterality: N/A;    Current Outpatient Medications  Medication Instructions   gabapentin  (NEURONTIN ) 100 mg, Oral, 3 times daily PRN   Multiple Vitamin (MULTIVITAMIN WITH MINERALS) TABS tablet 1 tablet, Daily   naproxen  sodium (ALEVE ) 220 mg, Oral, 2 times daily PRN   pantoprazole  (PROTONIX ) 40 mg, Oral, 2 times daily before meals   sildenafil  (VIAGRA ) 100 mg, Oral, Every other day   Vitamin D  2,000 Units, Daily       Objective:   Physical Exam BP 128/80   Pulse 60   Temp 98 F (36.7 C) (Oral)   Resp 16   Ht 5' 7 (1.702 m)   Wt 191 lb 2 oz (86.7 kg)   SpO2 97%   BMI 29.93 kg/m  General: Well developed, NAD, BMI noted Neck: No  thyromegaly  HEENT:   Normocephalic . Face symmetric, atraumatic Lungs:  CTA B Normal respiratory effort, no intercostal retractions, no accessory muscle use. Heart: RRR,  no murmur.  Abdomen:  Not distended, soft, non-tender. No rebound or rigidity.   Lower extremities: no pretibial edema bilaterally  Skin: Exposed areas without rash. Not pale. Not jaundice Neurologic:  alert & oriented X3.  Speech normal, gait appropriate for age and unassisted Motor and DTR symmetric. Psych: Cognition and judgment appear intact.  Cooperative with normal attention span and concentration.  Behavior appropriate. No anxious or depressed appearing.     Assessment    ASSESSMENT Prediabetes MSK: Cervical spondylosis Prostate cancer: -Prostatectomy 04-2018 - Biochemical recurrence, XRT February March 2024 Thyroid  nodules per ultrasound 04-2015.  US  11/23/2016: Stable, no further routine US  ED ++FH lun cancer  CTS -- NCS: 09/20/2022, B CTS, R>L .  Gabapentin  helps H/o Smoker  H/o early repol on EKG H/o anxiety   PLAN: Here for CPX - Td -2015 - s/p shingrix  x 2 -Rec covid and d flu shot this fall. -- h/o  Prostate cancer : per urology --Colon cancer screening Cscope 04-2016, cscope 05/2023  --Lung cancer screening: Has a FH of lung cancer, former smoker, see previous entries, referrals failed. -FH lung, colon and prostate cancer: declined  genetic counseling before  --Labs:   CMP FLP CBC A1c TSH vitamin D   B12 folic acid  --Diet and exercise discussed.  - Healthcare POA: See AVS  Other issues addressed today Prediabetes: Checking labs Generalized paresthesias: See OV from 08/2023, reports symptoms are about the same, neuro exam today grossly symmetric, decided not to pursue neurology, encouraged to do  but he declined.  Checking vitamin levels. Vitamin D  deficiency: Last level were low 8/ 2024, Rx cholecalciferol, recommend to start  OTC 2000 units daily Prostate cancer with biochemical recurrence: Last available  note from urology 10/22/2023: Recommended surveillance at that point.  PSA noted to be 0.8 on January 19, 2024 ([facility].  Increased from previous values Fatigue: New issue.  Denies snoring however Epworth scale is 13, quite elevated.  Referred to neurology, rule out a sleep apnea. RTC 6 months.

## 2024-04-02 NOTE — Patient Instructions (Addendum)
 Vaccines I recommend: Flu shot, COVID booster this fall.  Take over-the-counter vitamin D : 2000 units daily.   Will refer you to neurology for evaluation for sleep apnea.  GO TO THE LAB :  Get the blood work   Your results will be posted on MyChart with my comments  Next office visit for a checkup in 6 months Please make an appointment before you leave today

## 2024-04-02 NOTE — Assessment & Plan Note (Signed)
 Here for CPX   Other issues addressed today Prediabetes: Checking labs Generalized paresthesias: See OV from 08/2023, reports symptoms are about the same, neuro exam today grossly symmetric, decided not to pursue neurology, encouraged to do  but he declined.  Checking vitamin levels. Vitamin D  deficiency: Last level were low 8/ 2024, Rx cholecalciferol, recommend to start  OTC 2000 units daily Prostate cancer with biochemical recurrence: Last available note from urology 10/22/2023: Recommended surveillance at that point.  PSA noted to be 0.8 on January 19, 2024 ([facility].  Increased from previous values Fatigue: New issue.  Denies snoring however Epworth scale is 13, quite elevated.  Referred to neurology, rule out a sleep apnea. RTC 6 months.

## 2024-04-04 ENCOUNTER — Ambulatory Visit: Payer: Self-pay | Admitting: Internal Medicine

## 2024-04-05 MED ORDER — VITAMIN D (ERGOCALCIFEROL) 1.25 MG (50000 UNIT) PO CAPS: 50000.0000 [IU] | ORAL_CAPSULE | ORAL | 0 refills | Status: AC

## 2024-07-12 ENCOUNTER — Other Ambulatory Visit (HOSPITAL_COMMUNITY): Payer: Self-pay | Admitting: Urology

## 2024-07-12 DIAGNOSIS — C61 Malignant neoplasm of prostate: Secondary | ICD-10-CM

## 2024-07-26 ENCOUNTER — Encounter (HOSPITAL_COMMUNITY): Admission: RE | Admit: 2024-07-26 | Discharge: 2024-07-26 | Attending: Urology | Admitting: Urology

## 2024-07-26 DIAGNOSIS — C61 Malignant neoplasm of prostate: Secondary | ICD-10-CM

## 2024-07-26 MED ORDER — FLOTUFOLASTAT F 18 GALLIUM 296-5846 MBQ/ML IV SOLN
7.8000 | Freq: Once | INTRAVENOUS | Status: AC
  Administered 2024-07-26: 7.8 via INTRAVENOUS

## 2024-10-05 ENCOUNTER — Ambulatory Visit: Admitting: Internal Medicine
